# Patient Record
Sex: Male | Born: 1970 | Race: White | Hispanic: No | Marital: Single | Smoking: Never smoker
Health system: Southern US, Community
[De-identification: ages and names within clinical notes are randomized; demographics above are authoritative.]

## PROBLEM LIST (undated history)

## (undated) DIAGNOSIS — I82409 Acute embolism and thrombosis of unspecified deep veins of unspecified lower extremity: Secondary | ICD-10-CM

## (undated) DIAGNOSIS — R0600 Dyspnea, unspecified: Secondary | ICD-10-CM

## (undated) DIAGNOSIS — L03115 Cellulitis of right lower limb: Secondary | ICD-10-CM

## (undated) DIAGNOSIS — I739 Peripheral vascular disease, unspecified: Secondary | ICD-10-CM

## (undated) DIAGNOSIS — U071 COVID-19: Secondary | ICD-10-CM

## (undated) DIAGNOSIS — Z87442 Personal history of urinary calculi: Secondary | ICD-10-CM

## (undated) DIAGNOSIS — C801 Malignant (primary) neoplasm, unspecified: Secondary | ICD-10-CM

## (undated) DIAGNOSIS — E119 Type 2 diabetes mellitus without complications: Secondary | ICD-10-CM

## (undated) DIAGNOSIS — I10 Essential (primary) hypertension: Secondary | ICD-10-CM

## (undated) HISTORY — DX: Dyspnea, unspecified: R06.00

---

## 1998-10-09 ENCOUNTER — Encounter: Payer: Self-pay | Admitting: Emergency Medicine

## 1998-10-09 ENCOUNTER — Emergency Department (HOSPITAL_COMMUNITY): Admission: EM | Admit: 1998-10-09 | Discharge: 1998-10-09 | Payer: Self-pay | Admitting: Emergency Medicine

## 2004-06-14 ENCOUNTER — Emergency Department: Payer: Self-pay | Admitting: Emergency Medicine

## 2004-06-16 ENCOUNTER — Ambulatory Visit: Payer: Self-pay | Admitting: General Practice

## 2004-08-13 ENCOUNTER — Ambulatory Visit: Payer: Self-pay | Admitting: General Practice

## 2005-01-10 HISTORY — PX: CLAVICLE HARDWARE REMOVAL: SHX1351

## 2005-01-10 HISTORY — PX: FRACTURE SURGERY: SHX138

## 2013-06-18 ENCOUNTER — Encounter: Payer: Self-pay | Admitting: Critical Care Medicine

## 2013-06-19 ENCOUNTER — Encounter: Payer: Self-pay | Admitting: *Deleted

## 2013-06-19 ENCOUNTER — Ambulatory Visit (INDEPENDENT_AMBULATORY_CARE_PROVIDER_SITE_OTHER): Payer: BC Managed Care – PPO | Admitting: Critical Care Medicine

## 2013-06-19 ENCOUNTER — Encounter: Payer: Self-pay | Admitting: Critical Care Medicine

## 2013-06-19 VITALS — BP 118/80 | HR 73 | Temp 98.3°F | Ht 73.0 in | Wt 355.8 lb

## 2013-06-19 DIAGNOSIS — R0989 Other specified symptoms and signs involving the circulatory and respiratory systems: Secondary | ICD-10-CM

## 2013-06-19 DIAGNOSIS — R0600 Dyspnea, unspecified: Secondary | ICD-10-CM | POA: Insufficient documentation

## 2013-06-19 DIAGNOSIS — R0609 Other forms of dyspnea: Secondary | ICD-10-CM

## 2013-06-19 NOTE — Progress Notes (Signed)
Subjective:    Patient ID: Nicholas [name], male    DOB: 27-Jul-1970, 43 y.o.   MRN: [id number]  HPI Comments: Increased dyspnea with excess labor, farm work.  Noted about 1 month.  Heat is an issue.  Shortness of Breath This is a new problem. The current episode started more than 1 month ago. Episode frequency: exertional and only when very warm. The problem has been unchanged. Pertinent negatives include no chest pain, claudication, coryza, ear pain, fever, headaches, hemoptysis, leg pain, leg swelling, neck pain, orthopnea, PND, rash, rhinorrhea, sore throat, sputum production, swollen glands, syncope, vomiting or wheezing. The symptoms are aggravated by weather changes, odors, fumes and exercise. Associated symptoms comments: wieight up 40# in past year Snores, never studied for sleep apnea . He has tried nothing for the symptoms. There is no history of allergies, aspirin allergies, asthma, bronchiolitis, CAD, chronic lung disease, COPD, DVT, a heart failure, PE, pneumonia or a recent surgery.    Past Medical History  Diagnosis Date  . Dyspnea      Family History  Problem Relation Age of Onset  . Leukemia Mother   . Kidney disease Father   . Heart attack Maternal Grandfather   . Heart attack Paternal Grandfather      History   Social History  . Marital Status: Single    Spouse Name: N/A    Number of Children: N/A  . Years of Education: N/A   Occupational History  . [place] History Main Topics  . Smoking status: Never Smoker   . Smokeless tobacco: Current User    Types: Snuff  . Alcohol Use: No  . Drug Use: No  . Sexual Activity: Not on file   Other Topics Concern  . Not on file   Social History Narrative  . No narrative on file     Allergies  Allergen Reactions  . Penicillins      No outpatient prescriptions prior to visit.   No facility-administered medications prior to visit.      Review of Systems    Constitutional: Positive for fatigue and unexpected weight change. Negative for fever.  HENT: Negative for congestion, dental problem, ear pain, nosebleeds, postnasal drip, rhinorrhea, sinus pressure, sneezing, sore throat, trouble swallowing and voice change.   Eyes: Negative for redness and itching.  Respiratory: Positive for shortness of breath. Negative for apnea, cough, hemoptysis, sputum production, choking, chest tightness and wheezing.   Cardiovascular: Negative for chest pain, palpitations, orthopnea, claudication, leg swelling, syncope and PND.  Gastrointestinal: Positive for nausea. Negative for vomiting.       Some belching and heartburn  Genitourinary: Negative for dysuria.  Musculoskeletal: Negative for joint swelling and neck pain.  Skin: Negative for rash.  Neurological: Positive for light-headedness. Negative for headaches.  Hematological: Does not bruise/bleed easily.  Psychiatric/Behavioral: Negative for dysphoric mood. The patient is not nervous/anxious.        Objective:   Physical Exam  Filed Vitals:   06/19/13 0909  BP: 118/80  Pulse: 73  Temp: 98.3 F (36.8 C)  TempSrc: Oral  Height: 6\' 1"  (1.854 m)  Weight: 355 lb 12.8 oz (161.39 kg)  SpO2: 95%    Gen: Pleasant, obese, in no distress,  normal affect  ENT: No lesions,  mouth clear,  oropharynx clear, no postnasal drip  Neck: No JVD, no TMG, no carotid bruits  Lungs: No use of accessory muscles, no dullness to percussion, clear  without rales or rhonchi  Cardiovascular: RRR, heart sounds normal, no murmur or gallops, no peripheral edema  Abdomen: soft and NT, no HSM,  BS normal  Musculoskeletal: No deformities, no cyanosis or clubbing  Neuro: alert, non focal  Skin: Warm, no lesions or rashes  No results found.     Assessment & Plan:   Dyspnea Dyspnea with moderate obesity. Doubt primary lung disease Plan Nutrition referral for weight loss Pulmonary function study and 6 min walk and  ABG early morning slot at [facility] Review CXR    Updated Medication List No outpatient encounter prescriptions on file as of 06/19/2013.

## 2013-06-19 NOTE — Patient Instructions (Signed)
Nutrition referral for weight loss Pulmonary function study and 6 min walk and ABG early morning slot at [facility] We will get images from your chest xray to review Return after above completed

## 2013-06-20 NOTE — Assessment & Plan Note (Signed)
Dyspnea with moderate obesity. Doubt primary lung disease Plan Nutrition referral for weight loss Pulmonary function study and 6 min walk and ABG early morning slot at [facility] Review CXR

## 2013-06-25 ENCOUNTER — Telehealth: Payer: Self-pay | Admitting: *Deleted

## 2013-06-25 DIAGNOSIS — R0600 Dyspnea, unspecified: Secondary | ICD-10-CM

## 2013-06-25 NOTE — Telephone Encounter (Signed)
Message copied by Adalberto Ill on Tue Jun 25, 2013 11:01 AM ------      Message from: Nicholas [name]      Created: Mon Jun 24, 2013 11:08 AM       This pt needs a "sniff test" to be done at [facility] long radiology      I usually order this for fluoro but they may want to do it with ultrasound.             "evaluate diaphragm muscle weakness/paralysis, low lung volumes on imaging."            pw ------

## 2013-06-25 NOTE — Telephone Encounter (Signed)
Sniff Test scheduled for Thurs 06/27/13 at 11:30 at [facility]. Pt will already be over there that morning for PFT. Called and spoke with patient and he is aware that this test will be done after the 6 min walk, PFT and ABG. He has been advised to check in at Radiology Dept and no prep of this test. Pt is aware of appointment. Rhonda J Cobb

## 2013-06-25 NOTE — Telephone Encounter (Signed)
Pt returned call. Informed pt that PW wanted to schedule sniff test and someone will be contacting him to schedule it. Pt stated he will already be at [place] on 6/18 at 8am for other tests and has already taken a full day off work that day and wanted to see if the sniff test could be scheduled that day in the afternoon so he will not have to take another day off work. Advised pt that we will try and accommodate this. Pt verbalized understanding and denied any further questions or concerns at this time.   Will forward to [facility] and Crystal to make aware.

## 2013-06-25 NOTE — Telephone Encounter (Signed)
Sniff test order placed.  Per [facility], this will be done with fluoro. I ATC pt - went directly to VM.  [email or web address] to inform pt of below recs per PW and advised PCCs will be calling him to schedule this. Once I speak with pt, will need to send msg to [facility] to know to schedule as msg was created in [place] office.

## 2013-06-27 ENCOUNTER — Ambulatory Visit (HOSPITAL_COMMUNITY)
Admission: RE | Admit: 2013-06-27 | Discharge: 2013-06-27 | Disposition: A | Discharge status: Home / Self Care | Payer: BC Managed Care – PPO | Source: Ambulatory Visit | Attending: Critical Care Medicine | Admitting: Critical Care Medicine

## 2013-06-27 DIAGNOSIS — R0609 Other forms of dyspnea: Secondary | ICD-10-CM | POA: Insufficient documentation

## 2013-06-27 DIAGNOSIS — R0989 Other specified symptoms and signs involving the circulatory and respiratory systems: Principal | ICD-10-CM | POA: Insufficient documentation

## 2013-06-27 DIAGNOSIS — R0600 Dyspnea, unspecified: Secondary | ICD-10-CM

## 2013-06-27 LAB — PULMONARY FUNCTION TEST
DL/VA % pred: 116 %
DL/VA: 5.64 ml/min/mmHg/L
DLCO cor % pred: 106 %
DLCO cor: 38.62 ml/min/mmHg
DLCO unc % pred: 108 %
DLCO unc: 39.46 ml/min/mmHg
FEF 25-75 Post: 5.44 L/sec
FEF 25-75 Pre: 4.4 L/sec
FEF2575-%Change-Post: 23 %
FEF2575-%Pred-Post: 128 %
FEF2575-%Pred-Pre: 104 %
FEV1-%Change-Post: 5 %
FEV1-%Pred-Post: 90 %
FEV1-%Pred-Pre: 85 %
FEV1-Post: 4.13 L
FEV1-Pre: 3.9 L
FEV1FVC-%Change-Post: 1 %
FEV1FVC-%Pred-Pre: 104 %
FEV6-%Change-Post: 3 %
FEV6-%Pred-Post: 85 %
FEV6-%Pred-Pre: 82 %
FEV6-Post: 4.84 L
FEV6-Pre: 4.66 L
FEV6FVC-%Pred-Post: 103 %
FEV6FVC-%Pred-Pre: 103 %
FVC-%Change-Post: 3 %
FVC-%Pred-Post: 83 %
FVC-%Pred-Pre: 80 %
FVC-Post: 4.84 L
FVC-Pre: 4.66 L
Post FEV1/FVC ratio: 85 %
Post FEV6/FVC ratio: 100 %
Pre FEV1/FVC ratio: 84 %
Pre FEV6/FVC Ratio: 100 %
RV % pred: 74 %
RV: 1.49 L
TLC % pred: 85 %
TLC: 6.45 L

## 2013-06-27 LAB — BLOOD GAS, ARTERIAL
Acid-Base Excess: 1.8 mmol/L (ref 0.0–2.0)
Bicarbonate: 25.4 mEq/L — ABNORMAL HIGH (ref 20.0–24.0)
Drawn by: 244901
FIO2: 0.21 %
O2 Saturation: 95.4 %
Patient temperature: 98.6
TCO2: 21.8 mmol/L (ref 0–100)
pCO2 arterial: 38.5 mmHg (ref 35.0–45.0)
pH, Arterial: 7.435 (ref 7.350–7.450)
pO2, Arterial: 78.8 mmHg — ABNORMAL LOW (ref 80.0–100.0)

## 2013-06-27 MED ORDER — ALBUTEROL SULFATE (2.5 MG/3ML) 0.083% IN NEBU
2.5000 mg | INHALATION_SOLUTION | Freq: Once | RESPIRATORY_TRACT | Status: AC
  Administered 2013-06-27: 2.5 mg via RESPIRATORY_TRACT

## 2013-06-28 ENCOUNTER — Ambulatory Visit (HOSPITAL_COMMUNITY): Payer: BC Managed Care – PPO

## 2013-07-12 ENCOUNTER — Encounter: Payer: Self-pay | Admitting: Dietician

## 2013-07-12 ENCOUNTER — Encounter: Payer: BC Managed Care – PPO | Attending: Nurse Practitioner | Admitting: Dietician

## 2013-07-12 DIAGNOSIS — Z713 Dietary counseling and surveillance: Secondary | ICD-10-CM | POA: Insufficient documentation

## 2013-07-12 NOTE — Progress Notes (Signed)
  Medical Nutrition Therapy:  Appt start time: 1779 end time:  0840.   Assessment:  Primary concerns today: Rowdy is here today since he needs to lose weight and his doctor suggested that he talk to a dietitian. Has been having trouble breathing which the doctor thinks is being affected by his weight. 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 Lives with his young daughter most of the time and states that they most of their meals out since it is "cheaper than cooking". When he does cook it is on the grill. Skips 8-9 meals per week.   States he has "always been heavy" and it runs in his family. Has not tried to lose weight before and weight has been stable for about 3-4 years. States he is having a lot of stress, mostly due to dealing with custody issues with his daughter.   Does not have problems with cholesterol, blood sugar, and blood pressure.   Preferred Learning Style:   No preference indicated   Learning Readiness:   Ready  MEDICATIONS: none   DIETARY INTAKE:  Usual eating pattern includes 1-2 meals and 0 snacks per day.  Avoided foods include sweet drinks, Asian food   24-hr recall:  B ( AM): skips 5 x week, ham and cheese and omelet  Snk ( AM): none L ( PM): skips 3-4 x week, salad with iceberg lettuce, [place], ham and cheese or BBQ sandwich Snk ( PM): none D ( PM): steaks, vegetables, cheeseburgers, chicken  Snk ( PM): none Beverages: water, will have cranberry juice or milk every once in a while  Usual physical activity: not doing physical activity besides working  Estimated energy needs: 2200 calories 248 g carbohydrates 165 g protein 61 g fat  Progress Towards Goal(s):  In progress.   Nutritional Diagnosis:  [place]-3.3 Overweight/obesity As related to hx of meal skipping, large portions sizes, and frequent restaurant meals.  As evidenced by BMI of 46.6.    Intervention:  Nutrition counseling provided. Plan: Aim to walk  3-4 x week for 20-30 minutes. Eat 3 meals each day. For breakfast, try either a protein bar [facility]) or protein shake. Or have an Egg McMuffin with [employer]. Add fruit to meals and snacks. Aim to have half of your plate vegetables for lunch and dinner. (Think about packing a salad and bringing to work).  Half lean protein (not fried and remove skins and fat) the size of the palm of your hand. Try to use butter and bacon sparingly for flavor (try half the amount). Use small plates or get a to go box (if out) to help with portion sizes.  Teaching Method Utilized:  Visual Auditory Hands on  Handouts given during visit include:  MyPlate Handout   Barriers to learning/adherence to lifestyle change: stress, busy schedule  Demonstrated degree of understanding via:  Teach Back   Monitoring/Evaluation:  Dietary intake, exercise, and body weight in 2 month(s).'

## 2013-07-12 NOTE — Patient Instructions (Signed)
Aim to walk 3-4 x week for 20-30 minutes. Eat 3 meals each day. For breakfast, try either a protein bar [facility]) or protein shake. Or have an Egg McMuffin with [employer]. Add fruit to meals and snacks. Aim to have half of your plate vegetables for lunch and dinner. (Think about packing a salad and bringing to work).  Half lean protein (not fried and remove skins and fat) the size of the palm of your hand. Try to use butter and bacon sparingly for flavor (try half the amount). Use small plates or get a to go box (if out) to help with portion sizes.

## 2013-09-17 ENCOUNTER — Encounter (HOSPITAL_COMMUNITY): Payer: Self-pay | Admitting: Emergency Medicine

## 2013-09-17 ENCOUNTER — Inpatient Hospital Stay (HOSPITAL_COMMUNITY)
Admission: EM | Admit: 2013-09-17 | Discharge: 2013-09-21 | DRG: 300 | Disposition: A | Discharge status: Home / Self Care | Payer: BC Managed Care – PPO | Attending: Internal Medicine | Admitting: Internal Medicine

## 2013-09-17 ENCOUNTER — Emergency Department (HOSPITAL_COMMUNITY)
Admission: EM | Admit: 2013-09-17 | Discharge: 2013-09-17 | Disposition: A | Discharge status: Nursing Home (Includes ICF) | Payer: BC Managed Care – PPO | Source: Home / Self Care | Attending: Family Medicine | Admitting: Family Medicine

## 2013-09-17 DIAGNOSIS — I8000 Phlebitis and thrombophlebitis of superficial vessels of unspecified lower extremity: Principal | ICD-10-CM | POA: Diagnosis present

## 2013-09-17 DIAGNOSIS — L02419 Cutaneous abscess of limb, unspecified: Secondary | ICD-10-CM

## 2013-09-17 DIAGNOSIS — L0390 Cellulitis, unspecified: Secondary | ICD-10-CM | POA: Diagnosis present

## 2013-09-17 DIAGNOSIS — Z806 Family history of leukemia: Secondary | ICD-10-CM | POA: Diagnosis not present

## 2013-09-17 DIAGNOSIS — Z880 Allergy status to penicillin: Secondary | ICD-10-CM

## 2013-09-17 DIAGNOSIS — Z791 Long term (current) use of non-steroidal anti-inflammatories (NSAID): Secondary | ICD-10-CM | POA: Diagnosis not present

## 2013-09-17 DIAGNOSIS — Z841 Family history of disorders of kidney and ureter: Secondary | ICD-10-CM

## 2013-09-17 DIAGNOSIS — R0600 Dyspnea, unspecified: Secondary | ICD-10-CM | POA: Diagnosis present

## 2013-09-17 DIAGNOSIS — Z6841 Body Mass Index (BMI) 40.0 and over, adult: Secondary | ICD-10-CM | POA: Diagnosis not present

## 2013-09-17 DIAGNOSIS — Z823 Family history of stroke: Secondary | ICD-10-CM | POA: Diagnosis not present

## 2013-09-17 DIAGNOSIS — M7989 Other specified soft tissue disorders: Secondary | ICD-10-CM | POA: Diagnosis not present

## 2013-09-17 DIAGNOSIS — L03115 Cellulitis of right lower limb: Secondary | ICD-10-CM

## 2013-09-17 DIAGNOSIS — Z8249 Family history of ischemic heart disease and other diseases of the circulatory system: Secondary | ICD-10-CM | POA: Diagnosis not present

## 2013-09-17 DIAGNOSIS — M79609 Pain in unspecified limb: Secondary | ICD-10-CM

## 2013-09-17 DIAGNOSIS — L03119 Cellulitis of unspecified part of limb: Secondary | ICD-10-CM

## 2013-09-17 DIAGNOSIS — F17200 Nicotine dependence, unspecified, uncomplicated: Secondary | ICD-10-CM | POA: Diagnosis present

## 2013-09-17 DIAGNOSIS — I809 Phlebitis and thrombophlebitis of unspecified site: Secondary | ICD-10-CM

## 2013-09-17 HISTORY — DX: Cellulitis of right lower limb: L03.115

## 2013-09-17 LAB — CBC
HCT: 42.2 % (ref 39.0–52.0)
Hemoglobin: 14.4 g/dL (ref 13.0–17.0)
MCH: 29.6 pg (ref 26.0–34.0)
MCHC: 34.1 g/dL (ref 30.0–36.0)
MCV: 86.8 fL (ref 78.0–100.0)
Platelets: 260 10*3/uL (ref 150–400)
RBC: 4.86 MIL/uL (ref 4.22–5.81)
RDW: 13.5 % (ref 11.5–15.5)
WBC: 14.6 10*3/uL — ABNORMAL HIGH (ref 4.0–10.5)

## 2013-09-17 LAB — DIFFERENTIAL
Basophils Absolute: 0 10*3/uL (ref 0.0–0.1)
Basophils Relative: 0 % (ref 0–1)
Eosinophils Absolute: 0.2 10*3/uL (ref 0.0–0.7)
Eosinophils Relative: 1 % (ref 0–5)
Lymphocytes Relative: 20 % (ref 12–46)
Lymphs Abs: 2.9 10*3/uL (ref 0.7–4.0)
Monocytes Absolute: 1.2 10*3/uL — ABNORMAL HIGH (ref 0.1–1.0)
Monocytes Relative: 8 % (ref 3–12)
Neutro Abs: 10.3 10*3/uL — ABNORMAL HIGH (ref 1.7–7.7)
Neutrophils Relative %: 71 % (ref 43–77)

## 2013-09-17 LAB — BASIC METABOLIC PANEL
Anion gap: 14 (ref 5–15)
BUN: 12 mg/dL (ref 6–23)
CO2: 21 mEq/L (ref 19–32)
Calcium: 9 mg/dL (ref 8.4–10.5)
Chloride: 103 mEq/L (ref 96–112)
Creatinine, Ser: 0.88 mg/dL (ref 0.50–1.35)
GFR calc Af Amer: >90 mL/min (ref >90)
GFR calc non Af Amer: >90 mL/min (ref >90)
Glucose, Bld: 94 mg/dL (ref 70–99)
Potassium: 4.2 mEq/L (ref 3.7–5.3)
Sodium: 138 mEq/L (ref 137–147)

## 2013-09-17 MED ORDER — VANCOMYCIN HCL 10 G IV SOLR
2000.0000 mg | Freq: Once | INTRAVENOUS | Status: AC
  Administered 2013-09-17: 2000 mg via INTRAVENOUS
  Filled 2013-09-17: qty 2000

## 2013-09-17 MED ORDER — MORPHINE SULFATE 2 MG/ML IJ SOLN: 1.0000 mg | INTRAMUSCULAR | Status: DC | PRN

## 2013-09-17 MED ORDER — ACETAMINOPHEN 325 MG PO TABS: 650.0000 mg | ORAL_TABLET | Freq: Four times a day (QID) | ORAL | Status: DC | PRN

## 2013-09-17 MED ORDER — HEPARIN (PORCINE) IN NACL 100-0.45 UNIT/ML-% IJ SOLN
3000.0000 [IU]/h | INTRAMUSCULAR | Status: DC
  Administered 2013-09-17: 1500 [IU]/h via INTRAVENOUS
  Administered 2013-09-18: 2000 [IU]/h via INTRAVENOUS
  Administered 2013-09-19: 2400 [IU]/h via INTRAVENOUS
  Administered 2013-09-19: 2250 [IU]/h via INTRAVENOUS
  Administered 2013-09-20: 2800 [IU]/h via INTRAVENOUS
  Administered 2013-09-20 (×2): 3000 [IU]/h via INTRAVENOUS
  Administered 2013-09-20: 2800 [IU]/h via INTRAVENOUS
  Filled 2013-09-17 (×12): qty 250

## 2013-09-17 MED ORDER — HYDROMORPHONE HCL PF 1 MG/ML IJ SOLN: 1.0000 mg | INTRAMUSCULAR | Status: AC | PRN

## 2013-09-17 MED ORDER — ACETAMINOPHEN 650 MG RE SUPP: 650.0000 mg | Freq: Four times a day (QID) | RECTAL | Status: DC | PRN

## 2013-09-17 MED ORDER — ONDANSETRON HCL 4 MG PO TABS: 4.0000 mg | ORAL_TABLET | Freq: Four times a day (QID) | ORAL | Status: DC | PRN

## 2013-09-17 MED ORDER — VANCOMYCIN HCL 10 G IV SOLR
1500.0000 mg | Freq: Two times a day (BID) | INTRAVENOUS | Status: DC
  Administered 2013-09-18 – 2013-09-21 (×7): 1500 mg via INTRAVENOUS
  Filled 2013-09-17 (×8): qty 1500

## 2013-09-17 MED ORDER — PIPERACILLIN-TAZOBACTAM 3.375 G IVPB
3.3750 g | Freq: Three times a day (TID) | INTRAVENOUS | Status: DC
  Administered 2013-09-17 – 2013-09-21 (×11): 3.375 g via INTRAVENOUS
  Filled 2013-09-17 (×13): qty 50

## 2013-09-17 MED ORDER — HEPARIN BOLUS VIA INFUSION
4000.0000 [IU] | Freq: Once | INTRAVENOUS | Status: AC
  Administered 2013-09-17: 4000 [IU] via INTRAVENOUS
  Filled 2013-09-17: qty 4000

## 2013-09-17 MED ORDER — ONDANSETRON HCL 4 MG/2ML IJ SOLN: 4.0000 mg | Freq: Three times a day (TID) | INTRAMUSCULAR | Status: DC | PRN

## 2013-09-17 MED ORDER — CLINDAMYCIN PHOSPHATE 600 MG/50ML IV SOLN
600.0000 mg | Freq: Once | INTRAVENOUS | Status: AC
  Administered 2013-09-17: 600 mg via INTRAVENOUS
  Filled 2013-09-17: qty 50

## 2013-09-17 MED ORDER — SODIUM CHLORIDE 0.9 % IV SOLN
INTRAVENOUS | Status: DC
  Administered 2013-09-17: 23:00:00 via INTRAVENOUS

## 2013-09-17 MED ORDER — ONDANSETRON HCL 4 MG/2ML IJ SOLN: 4.0000 mg | Freq: Four times a day (QID) | INTRAMUSCULAR | Status: DC | PRN

## 2013-09-17 NOTE — ED Provider Notes (Addendum)
CSN: [id number]     Arrival date & time 09/17/13  1149 History   None    Chief Complaint  Patient presents with  . Leg Swelling   (Consider location/radiation/quality/duration/timing/severity/associated sxs/prior Treatment) Patient is a 43 y.o. male presenting with rash. The history is provided by the patient.  Rash Location:  Leg Leg rash location:  R upper leg Quality: painful and redness   Pain details:    Quality:  Throbbing   Severity:  Moderate   Progression:  Worsening Onset quality:  Sudden Duration:  4 days Progression:  Spreading Chronicity:  New Context comment:  Expanding since fri, assoc redness, pain and leg swelling to foot. Relieved by:  None tried Worsened by:  Nothing tried Ineffective treatments:  None tried Associated symptoms: induration and nausea   Associated symptoms: no fever and not vomiting     Past Medical History  Diagnosis Date  . Dyspnea   . Cellulitis of right leg 09/17/2013   Past Surgical History  Procedure Laterality Date  . Fracture surgery Left 2007    "shattered clavicle; broke it in 9 places"  . Clavicle hardware removal Left 2007   Family History  Problem Relation Age of Onset  . Leukemia Mother   . Kidney disease Father   . Heart attack Maternal Grandfather   . Heart attack Paternal Grandfather   . Stroke Other   . Heart disease Other   . Cancer Other    History  Substance Use Topics  . Smoking status: Never Smoker   . Smokeless tobacco: Current User    Types: Snuff  . Alcohol Use: No    Review of Systems  Constitutional: Negative.  Negative for fever.  Gastrointestinal: Positive for nausea. Negative for vomiting.  Musculoskeletal: Positive for gait problem. Negative for back pain.  Skin: Positive for rash.    Allergies  Penicillins  Home Medications   Prior to Admission medications   Not on File   BP 127/80  Pulse 87  Temp(Src) 99.2 F (37.3 C) (Oral)  Resp 16  SpO2 97% Physical Exam  Nursing note  and vitals reviewed. Constitutional: He is oriented to person, place, and time. He appears well-developed and well-nourished.  Abdominal: Soft. Bowel sounds are normal.  Musculoskeletal: He exhibits edema and tenderness.  Neurological: He is alert and oriented to person, place, and time.  Skin: There is erythema.  Well demarcated,nodular tender erythema to right medial thigh approx 10x 8 cm with assoc leg edema. Distal nvt intact.    ED Course  Procedures (including critical care time) Labs Review Labs Reviewed - No data to display  Imaging Review No results found.   MDM   1. Cellulitis of leg, right    Sent for further eval and care of right leg cellulitis and swelling, poss dvt.    Billy Fischer, MD 09/17/13 [place], MD 09/18/13 [phone]

## 2013-09-17 NOTE — Progress Notes (Signed)
*  PRELIMINARY RESULTS* Vascular Ultrasound Right lower extremity venous duplex has been completed.  Preliminary findings: No evidence of DVT. Multiple varicose veins noted in anterior thigh and some in the proximal thigh are thrombosed ( superficial thrombosis).  Landry Mellow, RDMS, RVT  09/17/2013, 3:36 PM

## 2013-09-17 NOTE — Progress Notes (Signed)
ANTIBIOTIC CONSULT NOTE - INITIAL  Pharmacy Consult for vancomycin and zosyn Indication: cellutlits  Allergies  Allergen Reactions  . Penicillins Other (See Comments)    Childhood reaction    Patient Measurements:   Adjusted Body Weight:   Vital Signs: Temp: 97.9 F (36.6 C) (09/08 1841) Temp src: Oral (09/08 1841) BP: 124/68 mmHg (09/08 1945) Pulse Rate: 82 (09/08 1945) Intake/Output from previous day:   Intake/Output from this shift:    Labs:  Recent Labs  09/17/13 1434  WBC 14.6*  HGB 14.4  PLT 260  CREATININE 0.88   The CrCl is unknown because both a height and weight (above a minimum accepted value) are required for this calculation. No results found for this basename: VANCOTROUGH, VANCOPEAK, VANCORANDOM, GENTTROUGH, GENTPEAK, GENTRANDOM, TOBRATROUGH, TOBRAPEAK, TOBRARND, AMIKACINPEAK, AMIKACINTROU, AMIKACIN,  in the last 72 hours   Microbiology: No results found for this or any previous visit (from the past 720 hour(s)).  Medical History: Past Medical History  Diagnosis Date  . Dyspnea     Medications:  Scheduled:   Infusions:  . sodium chloride     Assessment: 43 yo male with cellulitis will be started on vancomycin and zosyn.  SCr 0.88 (CrCl >100).  Patient weighs 160.3 kg.  Goal of Therapy:  Vancomycin trough level 10-15 mcg/ml  Plan:  - vancomycin 2g iv x1, then 1500 mg iv q12h - zosyn 3.375g iv q8h (4hr infusion) - monitor renal function and follow on antibiotic plan before checking vancomycin trough  Daylyn Azbill, Tsz-Yin 09/17/2013,8:44 PM

## 2013-09-17 NOTE — H&P (Signed)
[facility] History and Physical  Nicholas TEASDALE WUJ:[id number] DOB: [birth year] DOA: 09/17/2013  Referring physician: ER physician. PCP: Delia Chimes, NP  Specialists: Dr. Joya Gaskins. Pulmonologist.  Chief Complaint: Right lower extremity swelling and erythema.  HPI: Nicholas [name] is a 43 y.o. male with no significant past medical history started having right thigh swelling and erythema which progressed distally yesterday. Patient had gone to the urgent care and was referred to the ER. Patient had Dopplers done which only showed superficial thrombosis of the veins of the right thigh and no DVT. Patient on exam has swelling extending from the thigh with erythema to the distal part of the right leg. Patient has mild pain on moving and flexing the but has good sensation and pulses. Patient will be admitted for IV antibiotics for cellulitis. [place] but denies any trauma or insect bites. Patient has been evaluated by pulmonologist for dyspnea and was originally scheduled for followup with nutritionist tomorrow. Patient otherwise denies any chest pain or presently is not short of breath.  Review of Systems: As presented in the history of presenting illness, rest negative.  Past Medical History  Diagnosis Date  . Dyspnea    Past Surgical History  Procedure Laterality Date  . Shoulder surgery  2007   Social History:  reports that he has never smoked. His smokeless tobacco use includes Snuff. He reports that he does not drink alcohol or use illicit drugs. Where does patient live home. Can patient participate in ADLs? Yes.  Allergies  Allergen Reactions  . Penicillins Other (See Comments)    Childhood reaction    Family History:  Family History  Problem Relation Age of Onset  . Leukemia Mother   . Kidney disease Father   . Heart attack Maternal Grandfather   . Heart attack Paternal Grandfather   . Stroke Other   . Heart disease  Other   . Cancer Other       Prior to Admission medications   Not on File    Physical Exam: Filed Vitals:   09/17/13 1845 09/17/13 1900 09/17/13 1915 09/17/13 1945  BP: 110/69 126/72 130/70 124/68  Pulse: 70 76 77 82  Temp:      TempSrc:      Resp:      SpO2: 90% 97% 100% 97%     General:  Well-developed well-nourished.  Eyes: Anicteric no pallor.  ENT: No discharge from the ears eyes nose mouth.  Neck: No mass felt.  Cardiovascular: S1-S2 heard.  Respiratory: No rhonchi or crepitations.  Abdomen: Soft nontender bowel sounds present.  Skin: Erythema extending from the right thigh to the distal part of his right leg.  Musculoskeletal: Edema from thigh to the leg of right lower extremity.  Psychiatric: Appears normal.  Neurologic: Alert awake oriented to time place and person. Moves all extremities.  Labs on Admission:  Basic Metabolic Panel:  Recent Labs Lab 09/17/13 1434  NA 138  K 4.2  CL 103  CO2 21  GLUCOSE 94  BUN 12  CREATININE 0.88  CALCIUM 9.0   Liver Function Tests: No results found for this basename: AST, ALT, ALKPHOS, BILITOT, PROT, ALBUMIN,  in the last 168 hours No results found for this basename: LIPASE, AMYLASE,  in the last 168 hours No results found for this basename: AMMONIA,  in the last 168 hours CBC:  Recent Labs Lab 09/17/13 1434  WBC 14.6*  NEUTROABS 10.3*  HGB 14.4  HCT 42.2  MCV 86.8  PLT 260   Cardiac Enzymes: No results found for this basename: CKTOTAL, CKMB, CKMBINDEX, TROPONINI,  in the last 168 hours  BNP (last 3 results) No results found for this basename: PROBNP,  in the last 8760 hours CBG: No results found for this basename: GLUCAP,  in the last 168 hours  Radiological Exams on Admission: No results found.   Assessment/Plan Principal Problem:   Cellulitis of right leg Active Problems:   Thrombophlebitis   Cellulitis   1. Cellulitis of the right leg - patient has been placed on vancomycin  Zosyn. Closely observe for any worsening. Patient at this time does not have any symptoms of compartment syndrome but needs to be closely watched. 2. Right leg superficial thrombosis in the thigh area - have discussed with on-call vascular surgeon Dr. Trula Slade. Dr. Trula Slade advised patient to be placed on IV heparin for now and once patient's cellulitis improves to recheck Doppler of the right lower extremity to make sure there is no progression to DVT and if there is no progression then may discontinue anticoagulation. 3. History of dyspnea - being worked up by Dr. Joya Gaskins pulmonologist. Patient presently is not short of breath.    Code Status: Full code.  Family Communication: None.  Disposition Plan: Admit to inpatient.    Herberto Ledwell N. [facility] Pager [phone]  If 7PM-7AM, please contact night-coverage [email or web address] Password [email or web address] 09/17/2013, 8:40 PM

## 2013-09-17 NOTE — ED Notes (Signed)
PA at bedside.

## 2013-09-17 NOTE — ED Notes (Signed)
Pt  Has    Redness  Pain  /  Swelling  Of  r  Leg  X  5  Days         denys  Any  Injury       Redness  Spreading   Down the  Leg        -       Pt        Reports  Got  Nauseated          Earlier     Today

## 2013-09-17 NOTE — ED Provider Notes (Signed)
CSN: [id number]     Arrival date & time 09/17/13  1414 History   First MD Initiated Contact with Patient 09/17/13 1843     Chief Complaint  Patient presents with  . Leg Pain     (Consider location/radiation/quality/duration/timing/severity/associated sxs/prior Treatment) HPI Nicholas [name] is a 43 y.o. male who presents to ED with complaint of right leg pain and swelling. Pt reports symptoms began 4 days ago. States initially developed few red and swollen areas on the thigh, over the next 2 days it has spread down his right leg. States it is painful. Denies any injuries. No fever or chills. No hx of the same. Not taking any medications for this. Was seen at urgent care, sent here for further evaluation.      Past Medical History  Diagnosis Date  . Dyspnea    Past Surgical History  Procedure Laterality Date  . Shoulder surgery  2007   Family History  Problem Relation Age of Onset  . Leukemia Mother   . Kidney disease Father   . Heart attack Maternal Grandfather   . Heart attack Paternal Grandfather   . Stroke Other   . Heart disease Other   . Cancer Other    History  Substance Use Topics  . Smoking status: Never Smoker   . Smokeless tobacco: Current User    Types: Snuff  . Alcohol Use: No    Review of Systems  Constitutional: Negative for fever and chills.  Respiratory: Negative for cough, chest tightness and shortness of breath.   Cardiovascular: Negative for chest pain, palpitations and leg swelling.  Gastrointestinal: Negative for nausea, vomiting, abdominal pain, diarrhea and abdominal distention.  Genitourinary: Negative for dysuria, urgency, frequency and hematuria.  Musculoskeletal: Positive for myalgias. Negative for neck pain and neck stiffness.  Skin: Negative for rash.  Allergic/Immunologic: Negative for immunocompromised state.  Neurological: Negative for dizziness, weakness, light-headedness, numbness and headaches.      Allergies   Penicillins  Home Medications   Prior to Admission medications   Not on File   BP 110/69  Pulse 70  Temp(Src) 97.9 F (36.6 C) (Oral)  Resp 18  SpO2 90% Physical Exam  Nursing note and vitals reviewed. Constitutional: He appears well-developed and well-nourished. No distress.  HENT:  Head: Normocephalic and atraumatic.  Eyes: Conjunctivae are normal.  Neck: Neck supple.  Cardiovascular: Normal rate, regular rhythm and normal heart sounds.   Pulmonary/Chest: Effort normal. No respiratory distress. He has no wheezes. He has no rales.  Abdominal: Soft. Bowel sounds are normal. He exhibits no distension. There is no tenderness. There is no rebound.  Musculoskeletal:  Full rom of right ankle and knee  Neurological: He is alert.  Skin: Skin is warm and dry.  Extensive erythema, induration, warmth to palpation, tenderness over right anterior and medial thigh and right lateral lower leg. DP pulses intact.     ED Course  Procedures (including critical care time) Labs Review Labs Reviewed  CBC - Abnormal; Notable for the following:    WBC 14.6 (*)    All other components within normal limits  DIFFERENTIAL - Abnormal; Notable for the following:    Neutro Abs 10.3 (*)    Monocytes Absolute 1.2 (*)    All other components within normal limits  BASIC METABOLIC PANEL    Imaging Review No results found.   EKG Interpretation None      MDM   Final diagnoses:  Cellulitis of right leg  Thrombophlebitis  Patient with extensive cellulitis and apparently superficial thrombophlebitis in the right thigh, right lower leg. No DVTs noted. He is afebrile, nontoxic appearing. The white count is 14.6. Started on IV clindamycin. Due to the extent of cellulitis we'll admit for IV antibiotics. Spoke with Dr. Colvin Caroli who agreed.   Spoke with [facility], will admit.   Filed Vitals:   09/17/13 1841 09/17/13 1844 09/17/13 1845 09/17/13 1900  BP: 113/60  110/69 126/72  Pulse: 79  70 76   Temp: 97.9 F (36.6 C)     TempSrc: Oral     Resp: 18     SpO2: 98% 98% 90% 97%       Renold Genta, PA-C 09/17/13 1928

## 2013-09-17 NOTE — ED Notes (Signed)
Pt denies fevers/chills.

## 2013-09-17 NOTE — Progress Notes (Signed)
ANTICOAGULATION CONSULT NOTE - Initial Consult  Pharmacy Consult for heparin Indication: superficial thrombosis  Allergies  Allergen Reactions  . Penicillins Other (See Comments)    Childhood reaction    Patient Measurements:   Heparin Dosing Weight: 117.67 kg  Vital Signs: Temp: 97.9 F (36.6 C) (09/08 1841) Temp src: Oral (09/08 1841) BP: 124/68 mmHg (09/08 1945) Pulse Rate: 82 (09/08 1945)  Labs:  Recent Labs  09/17/13 1434  HGB 14.4  HCT Nicholas.2  PLT 260  CREATININE 0.88    The CrCl is unknown because both a height and weight (above a minimum accepted value) are required for this calculation.   Medical History: Past Medical History  Diagnosis Date  . Dyspnea     Medications:  Scheduled:  . piperacillin-tazobactam (ZOSYN)  IV  3.375 g Intravenous Q8H  . [START ON 09/18/2013] vancomycin  1,500 mg Intravenous Q12H  . vancomycin  2,000 mg Intravenous Once   Infusions:  . sodium chloride      Assessment: 43 yo [name] with superficial thrombosis will be started on heparin.  Baseline Hgb 14.4 and Plt 260 K.  DVT negative.  Goal of Therapy:  Heparin level 0.3-0.7 units/ml Monitor platelets by anticoagulation protocol: Yes   Plan:  - Heparin 4000 units iv bolus x1, then start heparin at 1500 units/hr - 6hr heparin level - daily heparin level and CBC  Chosen Geske, Tsz-Yin 09/17/2013,8:48 PM

## 2013-09-17 NOTE — ED Notes (Signed)
Pt sent from [facility] to be evaluated for redness and swelling to right upper leg x 4 days that is progressively worsening. Per note, possible cellulitis or DVT. Denies denies fever/chills. Pt denies pain when sitting. NAD. Ambulatory to triage.

## 2013-09-18 ENCOUNTER — Ambulatory Visit: Payer: BC Managed Care – PPO | Admitting: Dietician

## 2013-09-18 ENCOUNTER — Encounter (HOSPITAL_COMMUNITY): Payer: Self-pay | Admitting: General Practice

## 2013-09-18 DIAGNOSIS — R0609 Other forms of dyspnea: Secondary | ICD-10-CM

## 2013-09-18 DIAGNOSIS — R0989 Other specified symptoms and signs involving the circulatory and respiratory systems: Secondary | ICD-10-CM

## 2013-09-18 LAB — CBC
HCT: 38.9 % — ABNORMAL LOW (ref 39.0–52.0)
Hemoglobin: 13.2 g/dL (ref 13.0–17.0)
MCH: 29.5 pg (ref 26.0–34.0)
MCHC: 33.9 g/dL (ref 30.0–36.0)
MCV: 87 fL (ref 78.0–100.0)
Platelets: 229 10*3/uL (ref 150–400)
RBC: 4.47 MIL/uL (ref 4.22–5.81)
RDW: 13.7 % (ref 11.5–15.5)
WBC: 13.3 10*3/uL — ABNORMAL HIGH (ref 4.0–10.5)

## 2013-09-18 LAB — COMPREHENSIVE METABOLIC PANEL
ALT: 26 U/L (ref 0–53)
AST: 22 U/L (ref 0–37)
Albumin: 2.9 g/dL — ABNORMAL LOW (ref 3.5–5.2)
Alkaline Phosphatase: 56 U/L (ref 39–117)
Anion gap: 13 (ref 5–15)
BUN: 11 mg/dL (ref 6–23)
CO2: 21 mEq/L (ref 19–32)
Calcium: 8.4 mg/dL (ref 8.4–10.5)
Chloride: 106 mEq/L (ref 96–112)
Creatinine, Ser: 0.8 mg/dL (ref 0.50–1.35)
GFR calc Af Amer: >90 mL/min (ref >90)
GFR calc non Af Amer: >90 mL/min (ref >90)
Glucose, Bld: 102 mg/dL — ABNORMAL HIGH (ref 70–99)
Potassium: 4.1 mEq/L (ref 3.7–5.3)
Sodium: 140 mEq/L (ref 137–147)
Total Bilirubin: 0.6 mg/dL (ref 0.3–1.2)
Total Protein: 6.4 g/dL (ref 6.0–8.3)

## 2013-09-18 LAB — HEPARIN LEVEL (UNFRACTIONATED)
Heparin Unfractionated: <0.1 IU/mL — ABNORMAL LOW (ref 0.30–0.70)
Heparin Unfractionated: 0.21 IU/mL — ABNORMAL LOW (ref 0.30–0.70)
Heparin Unfractionated: <0.1 IU/mL — ABNORMAL LOW (ref 0.30–0.70)

## 2013-09-18 LAB — CK: Total CK: 142 U/L (ref 7–232)

## 2013-09-18 MED ORDER — HEPARIN BOLUS VIA INFUSION
3000.0000 [IU] | Freq: Once | INTRAVENOUS | Status: AC
  Administered 2013-09-18: 3000 [IU] via INTRAVENOUS
  Filled 2013-09-18: qty 3000

## 2013-09-18 NOTE — Progress Notes (Signed)
[place] for heparin Indication: superficial thrombosis  Allergies  Allergen Reactions  . Penicillins Other (See Comments)    Childhood reaction    Labs:  Recent Labs  09/17/13 1434 09/18/13 0410 09/18/13 1414  HGB 14.4 13.2  --   HCT 42.2 38.9*  --   PLT 260 229  --   HEPARINUNFRC  --  <0.10* 0.21*  CREATININE 0.88 0.80  --   CKTOTAL  --  142  --     Estimated Creatinine Clearance: 194.1 ml/min (by C-G formula based on Cr of 0.8).  Assessment: 43 yo male with RLE superficial thrombosis for heparin Heparin level = 0.21   Goal of Therapy:  Heparin level 0.3-0.7 units/ml Monitor platelets by anticoagulation protocol: Yes   Plan:  Increase heparin to 2250 units / hr Follow up 6 hour heparin level  Thank you. Anette Guarneri, PharmD [phone]  Tad Moore 09/18/2013,4:08 PM

## 2013-09-18 NOTE — Progress Notes (Signed)
ANTICOAGULATION CONSULT NOTE - Initial Consult  Pharmacy Consult for heparin Indication: superficial thrombosis  Allergies  Allergen Reactions  . Penicillins Other (See Comments)    Childhood reaction    Patient Measurements: Height: 6' 0.83" (185 cm) Weight: 353 lb 6.4 oz (160.3 kg) (07/2013) IBW/kg (Calculated) : 79.52 Heparin Dosing Weight: 117.67 kg  Vital Signs: Temp: 98.3 F (36.8 C) (09/08 2154) Temp src: Oral (09/08 1841) BP: 128/68 mmHg (09/08 2154) Pulse Rate: 82 (09/08 2154)  Labs:  Recent Labs  09/17/13 1434 09/18/13 0410  HGB 14.4 13.2  HCT 42.2 38.9*  PLT 260 229  HEPARINUNFRC  --  <0.10*  CREATININE 0.88 0.80  CKTOTAL  --  142    Estimated Creatinine Clearance: 194.1 ml/min (by C-G formula based on Cr of 0.8).  Assessment: 43 yo male with RLE superficial thrombosis for heparin   Goal of Therapy:  Heparin level 0.3-0.7 units/ml Monitor platelets by anticoagulation protocol: Yes   Plan:  Heparin 3000 units IV bolus, then increase heparin 2000 units/hr Check heparin level in 6 hours.     Aradhana Gin, Bronson Curb 09/18/2013,5:17 AM

## 2013-09-18 NOTE — Progress Notes (Signed)
Patient ID: Nicholas [name]  male  [id number]    DOB: 1970/04/04    DOA: 09/17/2013  PCP: Delia Chimes, NP  Assessment/Plan: Principal Problem:   Cellulitis of right leg and thigh  - Still has significant induration on the right thigh, patient states it is improving - Continue IV vancomycin and Zosyn - Will outline the cellulitic area for closer monitoring, currently no symptoms of compartment syndrome   Active Problems:  Right leg superficial Thrombophlebitis - Dr. Hal Hope discussed with on-call vascular surgeon, Dr. Trula Slade, recommended IV heparin for now and wants the patient's cellulitis has improved, recheck Doppler of the right lower extremity to make sure there is no progression of DVT. If there is no progression discontinue anticoagulation.  Dyspnea: Prior history, being worked up by Dr. Joya Gaskins his outpatient pulmonologist, currently has no complaints  DVT Prophylaxis: heparin drip  Code Status: full code  Family Communication:  Disposition:  Consultants:   Vascular surgery  Procedures:   None  Antibiotics:   IV vancomycin   Subjective:  Patient seen and examined, pain is somewhat controlled, cellulitis going on for ~1 week  Objective: Weight change:   Intake/Output Summary (Last 24 hours) at 09/18/13 0900 Last data filed at 09/18/13 0844  Gross per 24 hour  Intake    240 ml  Output      0 ml  Net    240 ml   Blood pressure 105/70, pulse 76, temperature 99 F (37.2 C), temperature source Oral, resp. rate 18, height 6' 0.84" (1.85 m), weight 160.3 kg (353 lb 6.4 oz), SpO2 97.00%.  Physical Exam: General: Alert and awake, oriented x3, not in any acute distress. CVS: S1-S2 clear, no murmur rubs or gallops Chest: clear to auscultation bilaterally, no wheezing, rales or rhonchi Abdomen: soft nontender, nondistended, normal bowel sounds  Extremities: no cyanosis, clubbing, +edema  right lower extremity, erythematous indurated area on the right  thigh about 4x5cm Neuro: Cranial nerves II-XII intact, no focal neurological deficits  Lab Results: Basic Metabolic Panel:  Recent Labs Lab 09/17/13 1434 09/18/13 0410  NA 138 140  K 4.2 4.1  CL 103 106  CO2 21 21  GLUCOSE 94 102*  BUN 12 11  CREATININE 0.88 0.80  CALCIUM 9.0 8.4   Liver Function Tests:  Recent Labs Lab 09/18/13 0410  AST 22  ALT 26  ALKPHOS 56  BILITOT 0.6  PROT 6.4  ALBUMIN 2.9*   No results found for this basename: LIPASE, AMYLASE,  in the last 168 hours No results found for this basename: AMMONIA,  in the last 168 hours CBC:  Recent Labs Lab 09/17/13 1434 09/18/13 0410  WBC 14.6* 13.3*  NEUTROABS 10.3*  --   HGB 14.4 13.2  HCT 42.2 38.9*  MCV 86.8 87.0  PLT 260 229   Cardiac Enzymes:  Recent Labs Lab 09/18/13 0410  CKTOTAL 142   BNP: No components found with this basename: POCBNP,  CBG: No results found for this basename: GLUCAP,  in the last 168 hours   Micro Results: No results found for this or any previous visit (from the past 240 hour(s)).  Studies/Results: No results found.  Medications: Scheduled Meds: . piperacillin-tazobactam (ZOSYN)  IV  3.375 g Intravenous Q8H  . vancomycin  1,500 mg Intravenous Q12H      LOS: 1 day   RAI,RIPUDEEP M.D. [facility] 09/18/2013, 9:00 AM Pager: [phone]  If 7PM-7AM, please contact night-coverage [email or web address] Password [email or web address]  **Disclaimer: This note was dictated with voice recognition  software. Similar sounding words can inadvertently be transcribed and this note may contain transcription errors which may not have been corrected upon publication of note.**

## 2013-09-19 ENCOUNTER — Inpatient Hospital Stay (HOSPITAL_COMMUNITY): Payer: BC Managed Care – PPO

## 2013-09-19 LAB — CBC
HCT: 39.7 % (ref 39.0–52.0)
Hemoglobin: 13.2 g/dL (ref 13.0–17.0)
MCH: 29.8 pg (ref 26.0–34.0)
MCHC: 33.2 g/dL (ref 30.0–36.0)
MCV: 89.6 fL (ref 78.0–100.0)
Platelets: 235 10*3/uL (ref 150–400)
RBC: 4.43 MIL/uL (ref 4.22–5.81)
RDW: 13.6 % (ref 11.5–15.5)
WBC: 12.9 10*3/uL — ABNORMAL HIGH (ref 4.0–10.5)

## 2013-09-19 LAB — HEPARIN LEVEL (UNFRACTIONATED)
Heparin Unfractionated: 0.15 IU/mL — ABNORMAL LOW (ref 0.30–0.70)
Heparin Unfractionated: <0.1 IU/mL — ABNORMAL LOW (ref 0.30–0.70)

## 2013-09-19 IMAGING — US US EXTREM LOW*R* LIMITED
1 series · 8 of 8 positions shown · non-contrast
Comparison: No comparison

CLINICAL DATA: 42-year-old male with a history of thigh pain,
question of abscess

EXAM:
ULTRASOUND right thigh LOWER EXTREMITY LIMITED
TECHNIQUE: Ultrasound examination of the lower extremity soft tissues was
performed in the area of clinical concern. Grayscale and color
images included of the superficial soft tissues overlying the
abnormality.

[Series 1: us extrem low*right* limited · 0.06mm/px · 8 of 8 slices shown]
[im 1/8]
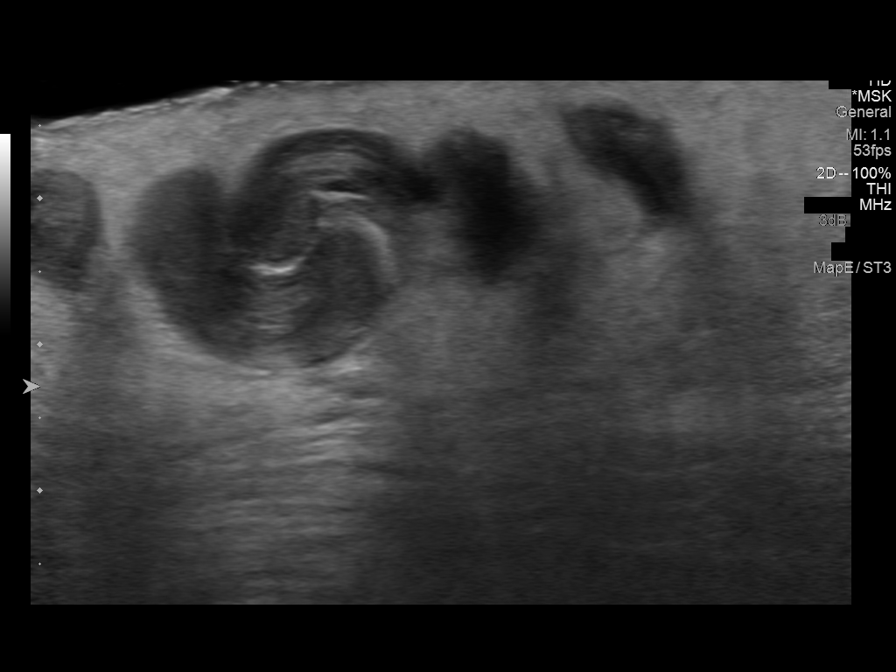
[im 2/8]
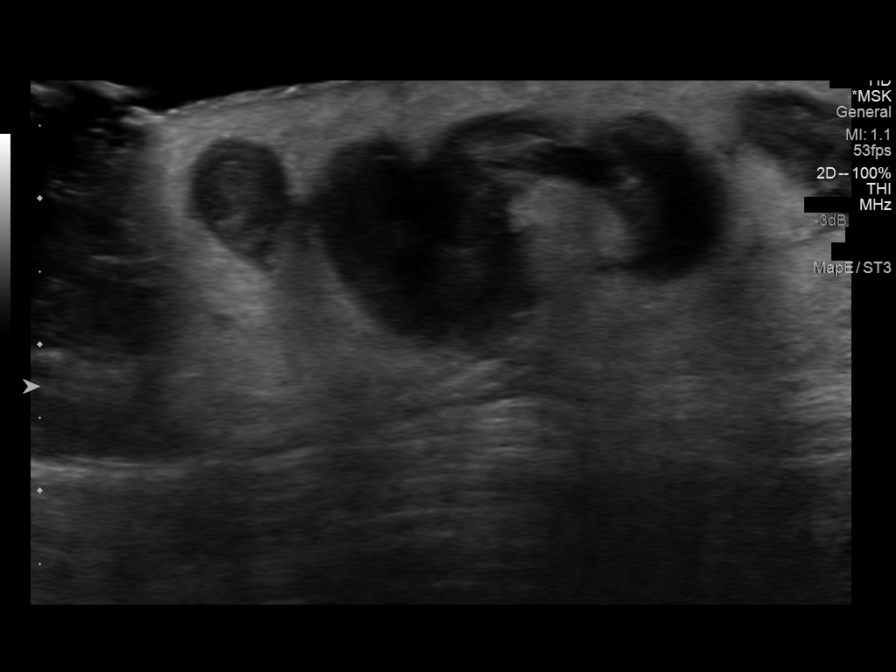
[im 3/8]
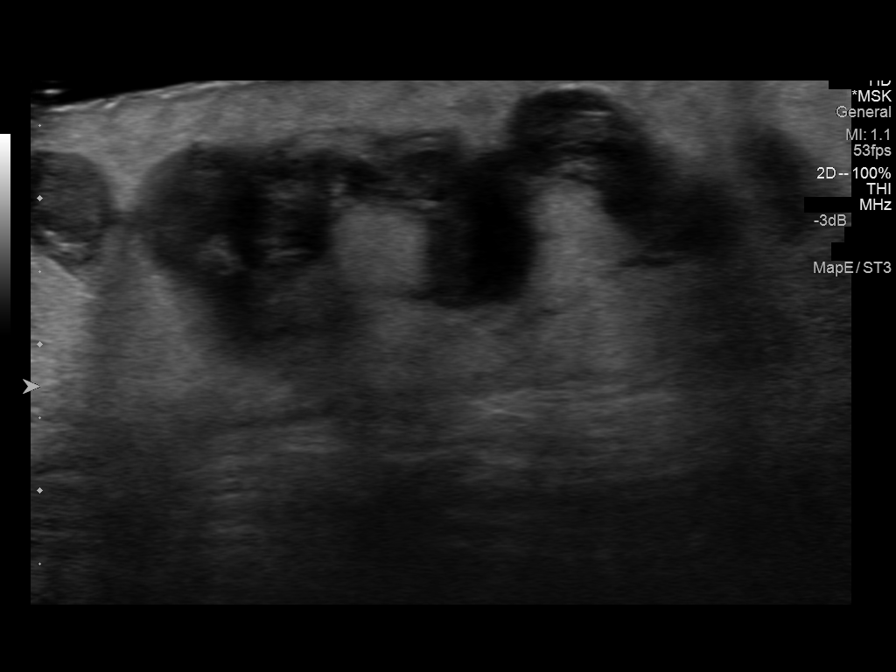
[im 4/8]
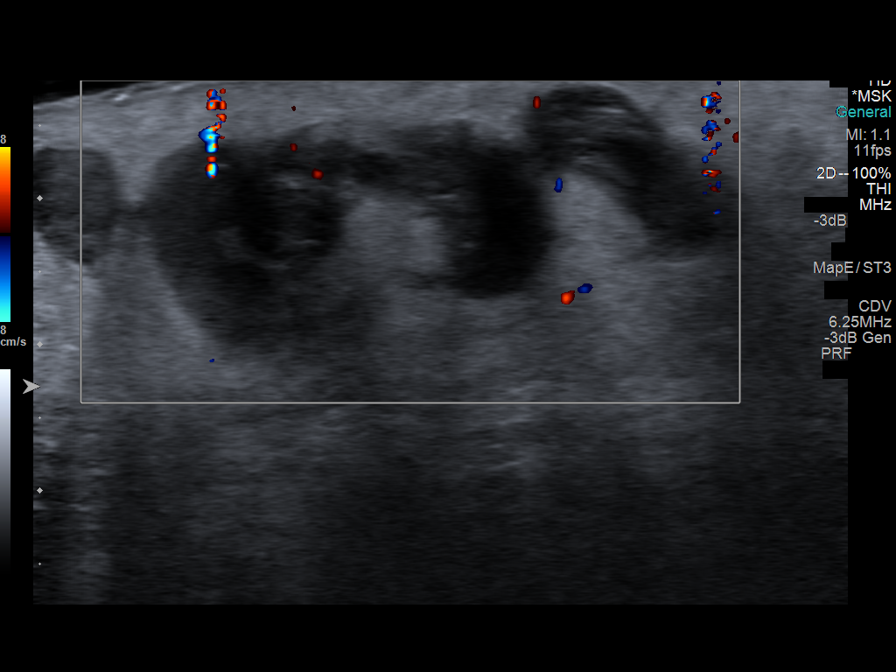
[im 5/8]
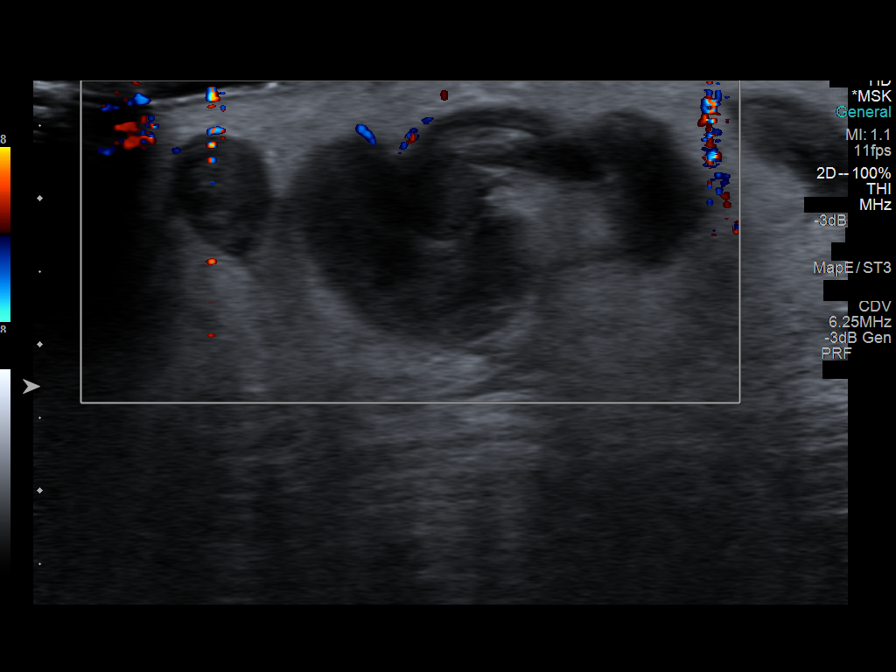
[im 6/8]
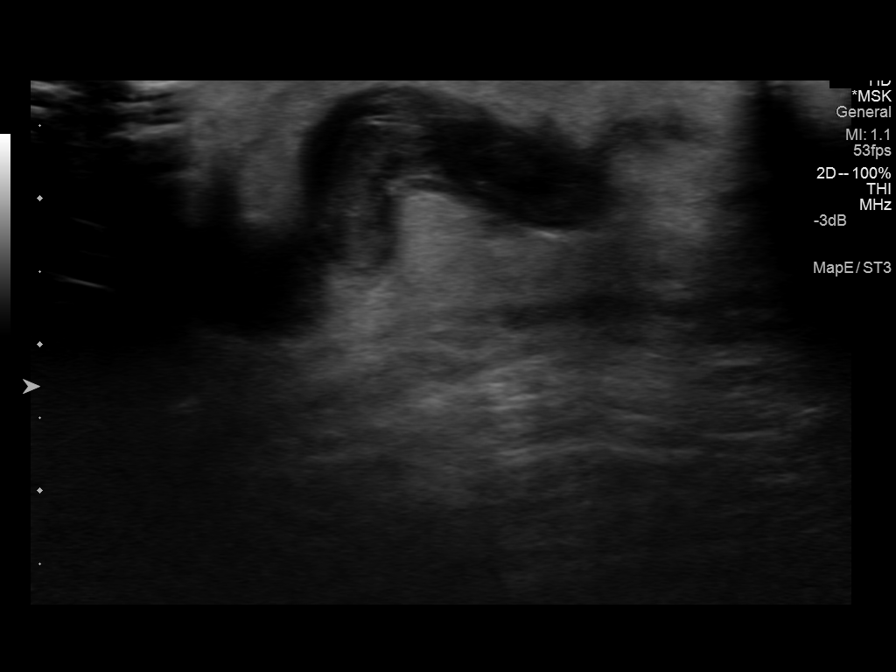
[im 7/8]
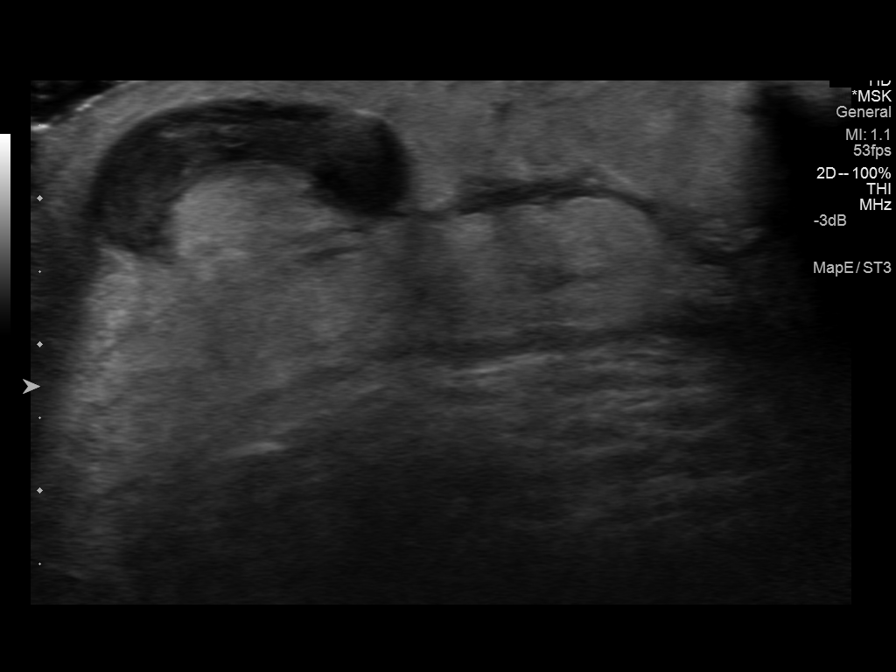
[im 8/8]
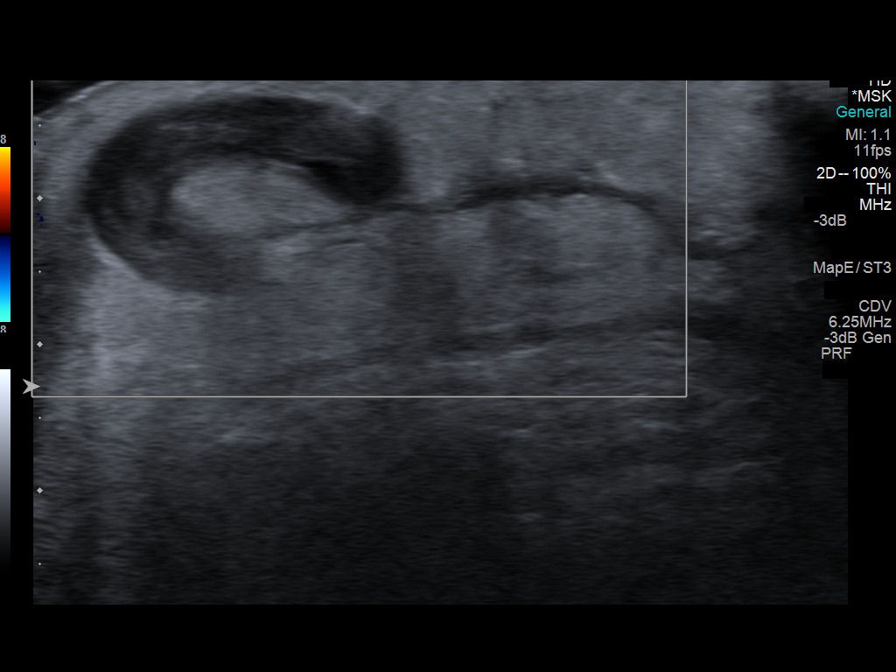

[8 of 8 positions shown; findings below may reference images not displayed]

FINDINGS: Multiple grayscale and color images of the right thigh were
performed overlying a region of erythema and pain.

Multiple dilated tortuous hypoechoic structures are evident on both
grayscale and color imaging. Color imaging demonstrates no internal
flow. All the structures are present within the superficial soft
tissues above the muscle plane. No compression views included. Mild
striations soft tissues compatible with edema.

No focal fluid collection.
IMPRESSION: Limited sonographic survey demonstrates tortuous structures in the
superficial soft tissues of the right thigh, favored to represent
venous varicosities, with features most compatible with
thrombophlebitis.

No focal fluid collection identified.

## 2013-09-19 MED ORDER — HEPARIN BOLUS VIA INFUSION
4000.0000 [IU] | Freq: Once | INTRAVENOUS | Status: AC
  Administered 2013-09-19: 4000 [IU] via INTRAVENOUS
  Filled 2013-09-19: qty 4000

## 2013-09-19 MED ORDER — HEPARIN BOLUS VIA INFUSION
2000.0000 [IU] | Freq: Once | INTRAVENOUS | Status: AC
  Administered 2013-09-19: 2000 [IU] via INTRAVENOUS
  Filled 2013-09-19: qty 2000

## 2013-09-19 NOTE — Progress Notes (Signed)
[place] for heparin Indication: superficial thrombosis  Allergies  Allergen Reactions  . Penicillins Other (See Comments)    Childhood reaction    Labs:  Recent Labs  09/17/13 1434 09/18/13 0410 09/18/13 1414 09/18/13 2234  HGB 14.4 13.2  --   --   HCT 42.2 38.9*  --   --   PLT 260 229  --   --   HEPARINUNFRC  --  <0.10* 0.21* <0.10*  CREATININE 0.88 0.80  --   --   CKTOTAL  --  142  --   --     Estimated Creatinine Clearance: 194.1 ml/min (by C-G formula based on Cr of 0.8).  Assessment: 43 yo male with RLE superficial thrombosis for heparin Heparin level = now undectectable despite rate increase. Heparin admixed bag hanging.    Goal of Therapy:  Heparin level 0.3-0.7 units/ml Monitor platelets by anticoagulation protocol: Yes   Plan:  Change heparin bag to commercially prepared product bolus 4000 unitsx1 then continue at 2250/hr. And recheck in 6 hours.  Curlene Dolphin 09/19/2013,12:00 AM

## 2013-09-19 NOTE — Progress Notes (Signed)
[place] for heparin Indication: superficial thrombosis  Allergies  Allergen Reactions  . Penicillins Other (See Comments)    Childhood reaction    Labs:  Recent Labs  09/17/13 1434  09/18/13 0410 09/18/13 1414 09/18/13 2234 09/19/13 0635  HGB 14.4  --  13.2  --   --  13.2  HCT 42.2  --  38.9*  --   --  39.7  PLT 260  --  229  --   --  235  HEPARINUNFRC  --   < > <0.10* 0.21* <0.10* 0.15*  CREATININE 0.88  --  0.80  --   --   --   CKTOTAL  --   --  142  --   --   --   < > = values in this interval not displayed.  Estimated Creatinine Clearance: 194.1 ml/min (by C-G formula based on Cr of 0.8).  Assessment: 43 yo male with RLE superficial thrombosis for heparin Heparin level = 0.15   Goal of Therapy:  Heparin level 0.3-0.7 units/ml Monitor platelets by anticoagulation protocol: Yes   Plan:  Heparin 2000 units iv bolus x 1 Increase heparin to 2400 units / hr Follow up 6 hour heparin level  Thank you. Anette Guarneri, PharmD [phone]  Tad Moore 09/19/2013,11:17 AM

## 2013-09-19 NOTE — Consult Note (Signed)
Reason for Consult:RLE cellulitis Referring Physician: Jamill Wetmore is an 43 y.o. male.  HPI: Nicholas [name] developed a sore place about the size of a quarter on his right inner thigh last Friday. Since then it steadily spread. He was seen an urgent care and referred to the ED. He was placed on heparin and IV abx.  Past Medical History  Diagnosis Date  . Dyspnea   . Cellulitis of right leg 09/17/2013    Past Surgical History  Procedure Laterality Date  . Fracture surgery Left 2007    "shattered clavicle; broke it in 9 places"  . Clavicle hardware removal Left 2007    Family History  Problem Relation Age of Onset  . Leukemia Mother   . Kidney disease Father   . Heart attack Maternal Grandfather   . Heart attack Paternal Grandfather   . Stroke Other   . Heart disease Other   . Cancer Other     Social History:  reports that he has never smoked. His smokeless tobacco use includes Snuff. He reports that he does not drink alcohol or use illicit drugs.  Allergies:  Allergies  Allergen Reactions  . Penicillins Other (See Comments)    Childhood reaction    Medications: I have reviewed the patient's current medications.  Results for orders placed during the hospital encounter of 09/17/13 (from the past 48 hour(s))  CBC     Status: Abnormal   Collection Time    09/17/13  2:34 PM      Result Value Ref Range   WBC 14.6 (*) 4.0 - 10.5 K/uL   RBC 4.86  4.22 - 5.81 MIL/uL   Hemoglobin 14.4  13.0 - 17.0 g/dL   HCT 42.2  39.0 - 52.0 %   MCV 86.8  78.0 - 100.0 fL   MCH 29.6  26.0 - 34.0 pg   MCHC 34.1  30.0 - 36.0 g/dL   RDW 13.5  11.5 - 15.5 %   Platelets 260  150 - 400 K/uL  BASIC METABOLIC PANEL     Status: None   Collection Time    09/17/13  2:34 PM      Result Value Ref Range   Sodium 138  137 - 147 mEq/L   Potassium 4.2  3.7 - 5.3 mEq/L   Chloride 103  96 - 112 mEq/L   CO2 21  19 - 32 mEq/L   Glucose, Bld 94  70 - 99 mg/dL   BUN 12  6 - 23 mg/dL   Creatinine, Ser  0.88  0.50 - 1.35 mg/dL   Calcium 9.0  8.4 - 10.5 mg/dL   GFR calc non Af Amer >90  >90 mL/min   GFR calc Af Amer >90  >90 mL/min   Comment: (NOTE)     The eGFR has been calculated using the CKD EPI equation.     This calculation has not been validated in all clinical situations.     eGFR's persistently <90 mL/min signify possible Chronic Kidney     Disease.   Anion gap 14  5 - 15  DIFFERENTIAL     Status: Abnormal   Collection Time    09/17/13  2:34 PM      Result Value Ref Range   Neutrophils Relative % 71  43 - 77 %   Neutro Abs 10.3 (*) 1.7 - 7.7 K/uL   Lymphocytes Relative 20  12 - 46 %   Lymphs Abs 2.9  0.7 - 4.0 K/uL  Monocytes Relative 8  3 - 12 %   Monocytes Absolute 1.2 (*) 0.1 - 1.0 K/uL   Eosinophils Relative 1  0 - 5 %   Eosinophils Absolute 0.2  0.0 - 0.7 K/uL   Basophils Relative 0  0 - 1 %   Basophils Absolute 0.0  0.0 - 0.1 K/uL  CK     Status: None   Collection Time    09/18/13  4:10 AM      Result Value Ref Range   Total CK 142  7 - 232 U/L  COMPREHENSIVE METABOLIC PANEL     Status: Abnormal   Collection Time    09/18/13  4:10 AM      Result Value Ref Range   Sodium 140  137 - 147 mEq/L   Potassium 4.1  3.7 - 5.3 mEq/L   Chloride 106  96 - 112 mEq/L   CO2 21  19 - 32 mEq/L   Glucose, Bld 102 (*) 70 - 99 mg/dL   BUN 11  6 - 23 mg/dL   Creatinine, Ser 0.80  0.50 - 1.35 mg/dL   Calcium 8.4  8.4 - 10.5 mg/dL   Total Protein 6.4  6.0 - 8.3 g/dL   Albumin 2.9 (*) 3.5 - 5.2 g/dL   AST 22  0 - 37 U/L   ALT 26  0 - 53 U/L   Alkaline Phosphatase 56  39 - 117 U/L   Total Bilirubin 0.6  0.3 - 1.2 mg/dL   GFR calc non Af Amer >90  >90 mL/min   GFR calc Af Amer >90  >90 mL/min   Comment: (NOTE)     The eGFR has been calculated using the CKD EPI equation.     This calculation has not been validated in all clinical situations.     eGFR's persistently <90 mL/min signify possible Chronic Kidney     Disease.   Anion gap 13  5 - 15  HEPARIN LEVEL  (UNFRACTIONATED)     Status: Abnormal   Collection Time    09/18/13  4:10 AM      Result Value Ref Range   Heparin Unfractionated <0.10 (*) 0.30 - 0.70 IU/mL   Comment:            IF HEPARIN RESULTS ARE BELOW     EXPECTED VALUES, AND PATIENT     DOSAGE HAS BEEN CONFIRMED,     SUGGEST FOLLOW UP TESTING     OF ANTITHROMBIN III LEVELS.     REPEATED TO VERIFY  CBC     Status: Abnormal   Collection Time    09/18/13  4:10 AM      Result Value Ref Range   WBC 13.3 (*) 4.0 - 10.5 K/uL   RBC 4.47  4.22 - 5.81 MIL/uL   Hemoglobin 13.2  13.0 - 17.0 g/dL   HCT 38.9 (*) 39.0 - 52.0 %   MCV 87.0  78.0 - 100.0 fL   MCH 29.5  26.0 - 34.0 pg   MCHC 33.9  30.0 - 36.0 g/dL   RDW 13.7  11.5 - 15.5 %   Platelets 229  150 - 400 K/uL  HEPARIN LEVEL (UNFRACTIONATED)     Status: Abnormal   Collection Time    09/18/13  2:14 PM      Result Value Ref Range   Heparin Unfractionated 0.21 (*) 0.30 - 0.70 IU/mL   Comment:            IF HEPARIN  RESULTS ARE BELOW     EXPECTED VALUES, AND PATIENT     DOSAGE HAS BEEN CONFIRMED,     SUGGEST FOLLOW UP TESTING     OF ANTITHROMBIN III LEVELS.  HEPARIN LEVEL (UNFRACTIONATED)     Status: Abnormal   Collection Time    09/18/13 10:34 PM      Result Value Ref Range   Heparin Unfractionated <0.10 (*) 0.30 - 0.70 IU/mL   Comment:            IF HEPARIN RESULTS ARE BELOW     EXPECTED VALUES, AND PATIENT     DOSAGE HAS BEEN CONFIRMED,     SUGGEST FOLLOW UP TESTING     OF ANTITHROMBIN III LEVELS.  HEPARIN LEVEL (UNFRACTIONATED)     Status: Abnormal   Collection Time    09/19/13  6:35 AM      Result Value Ref Range   Heparin Unfractionated 0.15 (*) 0.30 - 0.70 IU/mL   Comment:            IF HEPARIN RESULTS ARE BELOW     EXPECTED VALUES, AND PATIENT     DOSAGE HAS BEEN CONFIRMED,     SUGGEST FOLLOW UP TESTING     OF ANTITHROMBIN III LEVELS.  CBC     Status: Abnormal   Collection Time    09/19/13  6:35 AM      Result Value Ref Range   WBC 12.9 (*) 4.0 - 10.5  K/uL   RBC 4.43  4.22 - 5.81 MIL/uL   Hemoglobin 13.2  13.0 - 17.0 g/dL   HCT 39.7  39.0 - 52.0 %   MCV 89.6  78.0 - 100.0 fL   MCH 29.8  26.0 - 34.0 pg   MCHC 33.2  30.0 - 36.0 g/dL   RDW 13.6  11.5 - 15.5 %   Platelets 235  150 - 400 K/uL    Review of Systems  Cardiovascular: Positive for leg swelling.  Musculoskeletal: Positive for myalgias (RLE).  All other systems reviewed and are negative.  Blood pressure 123/65, pulse 80, temperature 97.9 F (36.6 C), temperature source Oral, resp. rate 18, height 6' 0.84" (1.85 m), weight 353 lb 6.4 oz (160.3 kg), SpO2 98.00%. Physical Exam  Musculoskeletal:  RLE: Inner thigh erythematous, indurated, TTP. Small focus of swelling distally could represent fluctuence. Erythema shows recession in this area from marking done yesterday. Shin and lateral calf TTP, mild erythema on lateral calf.     Assessment/Plan: RLE cellulitis/possible abscess -- I think there may be a small area of fluctuence distally on the thigh but given his anticoagulation, recession of erythema, improvement in his leukocytosis, and subjective sense of improvement it would be reasonable to wait another day to see if this area declares itself. I understand they may do an [place] which may help direct therapy if it shows a large abscess. We will follow along with you.   Thank you for this consult.    Lisette Abu, PA-C Pager: [phone] 09/19/2013, 10:17 AM

## 2013-09-19 NOTE — Clinical Documentation Improvement (Signed)
09/17/13 H&P.Marland KitchenMarland KitchenPatient has been evaluated by pulmonologist for dyspnea and was originally scheduled for followup with nutritionist tomorrow."... 09/18/13 Prog note..." weight 160.3 kg (353 lb 6.4 oz),  09/18/13 @ 0500 Doc Flow Sheet: "Ht> 6' 0.83"; WT 353 lb 6.4 oz; Cal BMI 46.9" For accurate Dx specificity & severity can noted clinical findings be further linked to possible, suspected or likely cond being eval'd, mon'd &/ or tx'd. Thank you  Possible Clinical conditions Morbid Obesity W/ BMI Underweight w/BMI Other condition Cannot clinically determine  Risk Factors: Sign & Symptoms: See above note Diagnostics: See above note Treatment: See above note  Thank You, Ermelinda Das, RN, BSN, CCDS Certified Clinical Documentation Specialist Pager: [phone] [place]: Health Information Management

## 2013-09-19 NOTE — Consult Note (Signed)
I have seen and examined the patient and agree with the assessment and plans.  The ultrasound does not show an underlying fluid collection, but his exam is very suspicious. If it is not markedly improved in the next 24 hours, will plan an I and D in the [place]. Ninfa Linden  MD, FACS

## 2013-09-19 NOTE — Progress Notes (Signed)
PHARMACY NOTE  Pharmacy Consult :  43 y.o. male is currently on Heparin bridging for atrial fibrillation .   Heparin Dosing Wt :  118 kg  Hematology :  Recent Labs  09/17/13 1434 09/18/13 0410 09/18/13 1414 09/18/13 2234 09/19/13 0635 09/19/13 1800  HGB 14.4 13.2  --   --  13.2  --   HCT 42.2 38.9*  --   --  39.7  --   PLT 260 229  --   --  235  --   HEPARINUNFRC  --  <0.10* 0.21* <0.10* 0.15* <0.10*  CREATININE 0.88 0.80  --   --   --   --     Current Medication[s] Include: Infusion[s]: Infusions:  . heparin 2,400 Units/hr (09/19/13 1242)   Assessment :  Follow up Heparin level after bolus and rate increase to 2400 units/hr reported as < 0.1 unit/ml.  No interruptions or infusion problems noted by RN report.  Heparin remains subtherapeutic.  No evidence of bleeding complications observed.  Goal :  Heparin level 0.3-0.7 units/ml.  Plan : 1. Heparin bolus 4000 units IV now, then  increase Heparin infusion to 2800 units/hr.  The next Heparin Level will be with the AM labs.    2. Daily Heparin level, CBC while on Heparin.  Monitor for bleeding complications. Follow Platelet counts.  Florabel Faulks, Craig Guess,  Pharm.D  09/19/2013  8:31 PM

## 2013-09-19 NOTE — Progress Notes (Signed)
Patient ID: Nicholas [name]  male  VEL:[id number]    DOB: 12-01-1970    DOA: 09/17/2013  PCP: Delia Chimes, NP  Assessment/Plan: Principal Problem:   Cellulitis of right leg and thigh  - Still has significant induration on the right thigh, had outlined it yesterday, ordered localize right thigh ultrasound to rule out any underlying abscess. Requested surgery consult if I&D needed - Continue IV vancomycin and Zosyn - currently no symptoms of compartment syndrome  Active Problems:  Right leg superficial Thrombophlebitis - Dr. Hal Hope discussed with on-call vascular surgeon, Dr. Trula Slade, recommended IV heparin for now and once patient's cellulitis has improved, recheck Doppler of the right lower extremity to make sure there is no progression of DVT. If there is no progression discontinue anticoagulation.  Dyspnea: Prior history, being worked up by Dr. Joya Gaskins his outpatient pulmonologist, currently has no complaints  Morbid obesity: Counseled on diet and weight control   DVT Prophylaxis: heparin drip  Code Status: full code  Family Communication:  Disposition:  Consultants:   Vascular surgery  Surgery  Procedures:   None  Antibiotics:   IV vancomycin   Zosyn  Subjective:  Patient seen and examined, still has significant induration, erythema, tenderness on the right thigh, cellulitis on the right lower leg is improving  Objective: Weight change:   Intake/Output Summary (Last 24 hours) at 09/19/13 1004 Last data filed at 09/19/13 0812  Gross per 24 hour  Intake    820 ml  Output    500 ml  Net    320 ml   Blood pressure 123/65, pulse 80, temperature 97.9 F (36.6 C), temperature source Oral, resp. rate 18, height 6' 0.84" (1.85 m), weight 160.3 kg (353 lb 6.4 oz), SpO2 98.00%.  Physical Exam: General: Alert and awake, oriented x3, NAD CVS: S1-S2 clear, no murmur rubs or gallops Chest: clear to auscultation bilaterally Abdomen: soft nontender, nondistended,  normal bowel sounds  Extremities: no cyanosis, clubbing, RLE: erythematous indurated area on the right thigh about 4x5cm, cellulitic area on right lower leg improving   Lab Results: Basic Metabolic Panel:  Recent Labs Lab 09/17/13 1434 09/18/13 0410  NA 138 140  K 4.2 4.1  CL 103 106  CO2 21 21  GLUCOSE 94 102*  BUN 12 11  CREATININE 0.88 0.80  CALCIUM 9.0 8.4   Liver Function Tests:  Recent Labs Lab 09/18/13 0410  AST 22  ALT 26  ALKPHOS 56  BILITOT 0.6  PROT 6.4  ALBUMIN 2.9*   No results found for this basename: LIPASE, AMYLASE,  in the last 168 hours No results found for this basename: AMMONIA,  in the last 168 hours CBC:  Recent Labs Lab 09/17/13 1434 09/18/13 0410 09/19/13 0635  WBC 14.6* 13.3* 12.9*  NEUTROABS 10.3*  --   --   HGB 14.4 13.2 13.2  HCT 42.2 38.9* 39.7  MCV 86.8 87.0 89.6  PLT 260 229 235   Cardiac Enzymes:  Recent Labs Lab 09/18/13 0410  CKTOTAL 142   BNP: No components found with this basename: POCBNP,  CBG: No results found for this basename: GLUCAP,  in the last 168 hours   Micro Results: No results found for this or any previous visit (from the past 240 hour(s)).  Studies/Results: No results found.  Medications: Scheduled Meds: . piperacillin-tazobactam (ZOSYN)  IV  3.375 g Intravenous Q8H  . vancomycin  1,500 mg Intravenous Q12H      LOS: 2 days   Mercy Leppla M.D. [facility] 09/19/2013,  10:04 AM Pager: [phone]  If 7PM-7AM, please contact night-coverage [email or web address] Password [email or web address]  **Disclaimer: This note was dictated with voice recognition software. Similar sounding words can inadvertently be transcribed and this note may contain transcription errors which may not have been corrected upon publication of note.**

## 2013-09-20 LAB — CBC
HCT: 39.1 % (ref 39.0–52.0)
Hemoglobin: 13.3 g/dL (ref 13.0–17.0)
MCH: 29.6 pg (ref 26.0–34.0)
MCHC: 34 g/dL (ref 30.0–36.0)
MCV: 87.1 fL (ref 78.0–100.0)
Platelets: 290 10*3/uL (ref 150–400)
RBC: 4.49 MIL/uL (ref 4.22–5.81)
RDW: 13.6 % (ref 11.5–15.5)
WBC: 12.9 10*3/uL — ABNORMAL HIGH (ref 4.0–10.5)

## 2013-09-20 LAB — HEPARIN LEVEL (UNFRACTIONATED)
Heparin Unfractionated: 0.3 IU/mL (ref 0.30–0.70)
Heparin Unfractionated: 0.2 IU/mL — ABNORMAL LOW (ref 0.30–0.70)

## 2013-09-20 MED ORDER — IBUPROFEN 400 MG PO TABS
400.0000 mg | ORAL_TABLET | Freq: Four times a day (QID) | ORAL | Status: DC | PRN
  Filled 2013-09-20: qty 1

## 2013-09-20 MED ORDER — FUROSEMIDE 10 MG/ML IJ SOLN
20.0000 mg | Freq: Once | INTRAMUSCULAR | Status: AC
  Administered 2013-09-20: 20 mg via INTRAVENOUS
  Filled 2013-09-20: qty 2

## 2013-09-20 NOTE — Progress Notes (Signed)
Subjective: He reports less right thigh pain  Objective: Vital signs in last 24 hours: Temp:  [97.8 F (36.6 C)-98.3 F (36.8 C)] 98.3 F (36.8 C) (09/11 0548) Pulse Rate:  [81-93] 81 (09/11 0548) Resp:  [16-18] 16 (09/11 0548) BP: (115-142)/(65-76) 115/65 mmHg (09/11 0548) SpO2:  [96 %-98 %] 96 % (09/11 0548) Last BM Date: 09/16/13  Intake/Output from previous day: 09/10 0701 - 09/11 0700 In: 240 [P.O.:240] Out: 800 [Urine:800] Intake/Output this shift: Total I/O In: 240 [P.O.:240] Out: -   Erythema remains on the right thigh, but the areas of concern are softer and much less tender  Lab Results:   Recent Labs  09/19/13 0635 09/20/13 0600  WBC 12.9* 12.9*  HGB 13.2 13.3  HCT 39.7 39.1  PLT 235 290   BMET  Recent Labs  09/17/13 1434 09/18/13 0410  NA 138 140  K 4.2 4.1  CL 103 106  CO2 21 21  GLUCOSE 94 102*  BUN 12 11  CREATININE 0.88 0.80  CALCIUM 9.0 8.4   PT/INR No results found for this basename: LABPROT, INR,  in the last 72 hours ABG No results found for this basename: PHART, PCO2, PO2, HCO3,  in the last 72 hours  Studies/Results: [place] Extrem Low Right Ltd  09/19/2013   CLINICAL DATA:  43 year old male with a history of thigh pain, question of abscess  EXAM: ULTRASOUND right thigh LOWER EXTREMITY LIMITED  TECHNIQUE: Ultrasound examination of the lower extremity soft tissues was performed in the area of clinical concern. Grayscale and color images included of the superficial soft tissues overlying the abnormality.  COMPARISON:  No comparison  FINDINGS: Multiple grayscale and color images of the right thigh were performed overlying a region of erythema and pain.  Multiple dilated tortuous hypoechoic structures are evident on both grayscale and color imaging. Color imaging demonstrates no internal flow. All the structures are present within the superficial soft tissues above the muscle plane. No compression views included. Mild striations soft tissues  compatible with edema.  No focal fluid collection.  IMPRESSION: Limited sonographic survey demonstrates tortuous structures in the superficial soft tissues of the right thigh, favored to represent venous varicosities, with features most compatible with thrombophlebitis.  No focal fluid collection identified.  Signed,  Dulcy Fanny. Earleen Newport, DO  [facility]  [facility]   Electronically Signed   By: Corrie Mckusick O.D.   On: 09/19/2013 12:34    Anti-infectives: Anti-infectives   Start     Dose/Rate Route Frequency Ordered Stop   09/18/13 1000  vancomycin (VANCOCIN) 1,500 mg in sodium chloride 0.9 % 500 mL IVPB     1,500 mg 250 mL/hr over 120 Minutes Intravenous Every 12 hours 09/17/13 2048     09/17/13 2300  piperacillin-tazobactam (ZOSYN) IVPB 3.375 g     3.375 g 12.5 mL/hr over 240 Minutes Intravenous Every 8 hours 09/17/13 2048     09/17/13 2200  vancomycin (VANCOCIN) 2,000 mg in sodium chloride 0.9 % 500 mL IVPB     2,000 mg 250 mL/hr over 120 Minutes Intravenous  Once 09/17/13 2048 09/18/13 0125   09/17/13 1900  clindamycin (CLEOCIN) IVPB 600 mg     600 mg 100 mL/hr over 30 Minutes Intravenous  Once 09/17/13 1855 09/17/13 2007      Assessment/Plan:  Right thigh cellulitis with superficial thrombophlebitis  I don't think there is underlying abscess.  I think we are feeling clot in superficial veins which I think are thrombosed veins.  I have no plans currently for surgery.  These should improve which warm compresses and ibuprofen.  LOS: 3 days    Gordon Carlson A 09/20/2013

## 2013-09-20 NOTE — Progress Notes (Signed)
ANTICOAGULATION  / Antibiotic CONSULT NOTE   Pharmacy Consult for heparin / Vancomycin and Zosyn Indication: superficial thrombosis  Allergies  Allergen Reactions  . Penicillins Other (See Comments)    Childhood reaction    Labs:  Recent Labs  09/17/13 1434 09/18/13 0410  09/19/13 0635 09/19/13 1800 09/20/13 0600 09/20/13 1140  HGB 14.4 13.2  --  13.2  --  13.3  --   HCT 42.2 38.9*  --  39.7  --  39.1  --   PLT 260 229  --  235  --  290  --   HEPARINUNFRC  --  <0.10*  < > 0.15* <0.10* 0.30 0.20*  CREATININE 0.88 0.80  --   --   --   --   --   CKTOTAL  --  142  --   --   --   --   --   < > = values in this interval not displayed.  Estimated Creatinine Clearance: 194.1 ml/min (by C-G formula based on Cr of 0.8).  Assessment: 43 yo male with RLE superficial thrombosis for heparin Heparin level = 0.2  - repeat doppler planned for today to determine need for long-term anti-coag  Day 4 of IV antibiotics with plan to transition to po 9/12   Goal of Therapy:  Heparin level 0.3-0.7 units/ml Monitor platelets by anticoagulation protocol: Yes Appropriate antibiotic dosing   Plan:  Heparin to 3000 units / hr Continue Zosyn 4 hr infusion Continue Vancomycin 1500 mg iv Q 12 hours Continue to follow  Thank you. Anette Guarneri, PharmD [phone]  Tad Moore

## 2013-09-20 NOTE — Progress Notes (Signed)
Patient ID: Nicholas [name]  male  MOL:[id number]    DOB: 01/20/1970    DOA: 09/17/2013  PCP: Delia Chimes, NP  Assessment/Plan: Principal Problem:   Cellulitis of right leg and thigh  - Still has significant induration on the right thigh, erythema has significantly improved, limited ultrasound showed venous varicosities most compatible with thrombophlebitis no focal fluid collection.  - Surgery was consulted, do not feel that patient needs I&D, likely underlying thrombosed veins, should improve with warm compresses and ibuprofen - Continue IV vancomycin and Zosyn for today, transitioned to oral antibiotics tomorrow - currently no symptoms of compartment syndrome, still significant edema, will try Lasix 20 mg x1  Active Problems:  Right leg superficial Thrombophlebitis - Dr. Hal Hope discussed with on-call vascular surgeon, Dr. Trula Slade, recommended IV heparin for now and once patient's cellulitis has improved, recheck Doppler of the right lower extremity to make sure there is no progression of DVT. If there is no progression discontinue anticoagulation. - Recheck Doppler of the right lower extremity, patient may need to be on anticoagulation and repeat the duplex in 4-6 weeks outpatient  Dyspnea: Prior history, being worked up by Dr. Joya Gaskins his outpatient pulmonologist, currently has no complaints  Morbid obesity: Counseled on diet and weight control   DVT Prophylaxis: heparin drip  Code Status: full code  Family Communication:  Disposition: 24-48 hours  Consultants:   Vascular surgery  Surgery  Procedures:   None  Antibiotics:   IV vancomycin   Zosyn  Subjective:  Patient seen and examined, still has significant induration, in the right eye but erythema and tenderness has significant improved, cellulitis of the right lower leg is improving, still tight  Objective: Weight change:   Intake/Output Summary (Last 24 hours) at 09/20/13 1040 Last data filed at  09/20/13 0845  Gross per 24 hour  Intake    240 ml  Output    800 ml  Net   -560 ml   Blood pressure 115/65, pulse 81, temperature 98.3 F (36.8 C), temperature source Oral, resp. rate 16, height 6' 0.84" (1.85 m), weight 160.3 kg (353 lb 6.4 oz), SpO2 96.00%.  Physical Exam: General: Alert and awake, oriented x3, NAD CVS: S1-S2 clear, no murmur rubs or gallops Chest: clear to auscultation bilaterally Abdomen: soft nontender, nondistended, normal bowel sounds  Extremities: no cyanosis, clubbing, RLE: indurated area on the right thigh, cellulitic area on right lower leg improving   Lab Results: Basic Metabolic Panel:  Recent Labs Lab 09/17/13 1434 09/18/13 0410  NA 138 140  K 4.2 4.1  CL 103 106  CO2 21 21  GLUCOSE 94 102*  BUN 12 11  CREATININE 0.88 0.80  CALCIUM 9.0 8.4   Liver Function Tests:  Recent Labs Lab 09/18/13 0410  AST 22  ALT 26  ALKPHOS 56  BILITOT 0.6  PROT 6.4  ALBUMIN 2.9*   No results found for this basename: LIPASE, AMYLASE,  in the last 168 hours No results found for this basename: AMMONIA,  in the last 168 hours CBC:  Recent Labs Lab 09/17/13 1434  09/19/13 0635 09/20/13 0600  WBC 14.6*  < > 12.9* 12.9*  NEUTROABS 10.3*  --   --   --   HGB 14.4  < > 13.2 13.3  HCT 42.2  < > 39.7 39.1  MCV 86.8  < > 89.6 87.1  PLT 260  < > 235 290  < > = values in this interval not displayed. Cardiac Enzymes:  Recent Labs Lab  09/18/13 0410  CKTOTAL 142   BNP: No components found with this basename: POCBNP,  CBG: No results found for this basename: GLUCAP,  in the last 168 hours   Micro Results: No results found for this or any previous visit (from the past 240 hour(s)).  Studies/Results: No results found.  Medications: Scheduled Meds: . piperacillin-tazobactam (ZOSYN)  IV  3.375 g Intravenous Q8H  . vancomycin  1,500 mg Intravenous Q12H      LOS: 3 days   Nicholas [name] M.D. [facility] 09/20/2013, 10:40 AM Pager:  [phone]  If 7PM-7AM, please contact night-coverage [email or web address] Password [email or web address]  **Disclaimer: This note was dictated with voice recognition software. Similar sounding words can inadvertently be transcribed and this note may contain transcription errors which may not have been corrected upon publication of note.**

## 2013-09-20 NOTE — Progress Notes (Signed)
ANTICOAGULATION CONSULT NOTE - Follow Up Consult  Pharmacy Consult for heparin Indication: atrial fibrillation  Labs:  Recent Labs  09/17/13 1434 09/18/13 0410  09/19/13 0635 09/19/13 1800 09/20/13 0600  HGB 14.4 13.2  --  13.2  --  13.3  HCT 42.2 38.9*  --  39.7  --  39.1  PLT 260 229  --  235  --  290  HEPARINUNFRC  --  <0.10*  < > 0.15* <0.10* 0.30  CREATININE 0.88 0.80  --   --   --   --   CKTOTAL  --  142  --   --   --   --   < > = values in this interval not displayed.   Assessment/Plan:  43yo male therapeutic on heparin after rate adjustments. Will continue gtt at current rate and confirm stable with additional level.   Wynona Neat, PharmD, BCPS  09/20/2013,8:01 AM

## 2013-09-21 LAB — BASIC METABOLIC PANEL
Anion gap: 15 (ref 5–15)
BUN: 11 mg/dL (ref 6–23)
CO2: 20 mEq/L (ref 19–32)
Calcium: 8.9 mg/dL (ref 8.4–10.5)
Chloride: 101 mEq/L (ref 96–112)
Creatinine, Ser: 0.86 mg/dL (ref 0.50–1.35)
GFR calc Af Amer: >90 mL/min (ref >90)
GFR calc non Af Amer: >90 mL/min (ref >90)
Glucose, Bld: 81 mg/dL (ref 70–99)
Potassium: 5.1 mEq/L (ref 3.7–5.3)
Sodium: 136 mEq/L — ABNORMAL LOW (ref 137–147)

## 2013-09-21 LAB — CBC
HCT: 39.5 % (ref 39.0–52.0)
Hemoglobin: 13.5 g/dL (ref 13.0–17.0)
MCH: 29.7 pg (ref 26.0–34.0)
MCHC: 34.2 g/dL (ref 30.0–36.0)
MCV: 86.8 fL (ref 78.0–100.0)
Platelets: 275 10*3/uL (ref 150–400)
RBC: 4.55 MIL/uL (ref 4.22–5.81)
RDW: 13.6 % (ref 11.5–15.5)
WBC: 12.5 10*3/uL — ABNORMAL HIGH (ref 4.0–10.5)

## 2013-09-21 MED ORDER — DOXYCYCLINE HYCLATE 100 MG PO CAPS: 100.0000 mg | ORAL_CAPSULE | Freq: Two times a day (BID) | ORAL | Status: DC

## 2013-09-21 MED ORDER — OMEPRAZOLE 20 MG PO CPDR: 20.0000 mg | DELAYED_RELEASE_CAPSULE | Freq: Every day | ORAL | Status: DC

## 2013-09-21 MED ORDER — IBUPROFEN 400 MG PO TABS: 400.0000 mg | ORAL_TABLET | Freq: Four times a day (QID) | ORAL | Status: DC | PRN

## 2013-09-21 NOTE — Discharge Summary (Signed)
Physician Discharge Summary  Patient ID: Nicholas [name] MRN: [id number] DOB/AGE: [birth year] 43 y.o.  Admit date: 09/17/2013 Discharge date: 09/21/2013  Primary Care Physician:  Delia Chimes, NP  Discharge Diagnoses:    . Cellulitis of right leg . superficial Thrombophlebitis . Cellulitis . Morbid obesity . Dyspnea  Consults:  General surgery, Dr. Ninfa Linden                    Vascular surgery   Recommendations for Outpatient Follow-up:  Patient was recommended ibuprofen, warm compresses on the right thigh with superficial thrombophlebitis.  Allergies:   Allergies  Allergen Reactions  . Penicillins Other (See Comments)    Childhood reaction     Discharge Medications:   Medication List         doxycycline 100 MG capsule  Commonly known as:  VIBRAMYCIN  Take 1 capsule (100 mg total) by mouth 2 (two) times daily. X 2WEEKS     ibuprofen 400 MG tablet  Commonly known as:  ADVIL,MOTRIN  Take 1 tablet (400 mg total) by mouth every 6 (six) hours as needed for moderate pain or cramping.     omeprazole 20 MG capsule  Commonly known as:  PRILOSEC  Take 1 capsule (20 mg total) by mouth daily. While you are taking ibuprofen         Brief H and P: For complete details please refer to admission H and P, but in [place] is a 43 y.o. male with no significant past medical history started having right thigh swelling and erythema which progressed distally yesterday. Patient had gone to the urgent care and was referred to the ER. Patient had Dopplers done which only showed superficial thrombosis of the veins of the right thigh and no DVT. Patient on exam had swelling extending from the thigh with erythema to the distal part of the right leg. Patient has mild pain on moving and flexing the but has good sensation and pulses. Patient was admitted for IV antibiotics for cellulitis. [place] but denied any trauma or insect bites.  Patient had  been evaluated by pulmonologist for dyspnea and was originally scheduled for followup with nutritionist tomorrow.  Hospital Course:  Cellulitis of right leg and thigh  Patient was admitted for IV antibiotics. Limited ultrasound showed venous varicosities most compatible with thrombophlebitis no focal fluid collection. Surgery was consulted, do not feel that patient needs I&D, likely underlying thrombosed veins, should improve with warm compresses and ibuprofen. Patient was transitioned to oral doxycycline for 2 weeks. He will followup with Dr. Ninfa Linden for wound check in one to 2 weeks.  Right leg superficial Thrombophlebitis  Patient was placed on IV heparin on admission. Doppler ultrasound of the lower extremity showed superficial thrombophlebitis in right greater saphenous vein and varicosities in the proximal thigh, patent right saphenofemoral junction. I discussed in detail with Dr. Bridgett Larsson with vascular surgery who did not recommend oral anticoagulation for superficial thrombophlebitis. Dr. Vallarie Mare recommended warm compresses and ibuprofen. Patient will followup with Dr. Bridgett Larsson in office in 3-4 weeks for followup.  Dyspnea: Prior history, being worked up by Dr. Joya Gaskins his outpatient pulmonologist, currently has no complaints   Morbid obesity: Counseled on diet and weight control     Day of Discharge BP 107/67  Pulse 80  Temp(Src) 98 F (36.7 C) (Oral)  Resp 16  Ht 6' 0.83" (1.85 m)  Wt 160.3 kg (353 lb 6.4 oz)  BMI 46.84 kg/m2  SpO2 98%  Physical Exam: General: Alert and awake oriented x3 not in any acute distress. CVS: S1-S2 clear no murmur rubs or gallops Chest: clear to auscultation bilaterally, no wheezing rales or rhonchi Abdomen: soft nontender, nondistended, normal bowel sounds Extremities: no cyanosis, clubbing or edema noted bilaterally, right thigh and leg cellulitis has significantly improved Neuro: Cranial nerves II-XII intact, no focal neurological  deficits   The results of significant diagnostics from this hospitalization (including imaging, microbiology, ancillary and laboratory) are listed below for reference.    LAB RESULTS: Basic Metabolic Panel:  Recent Labs Lab 09/18/13 0410 09/21/13 0725  NA 140 136*  K 4.1 5.1  CL 106 101  CO2 21 20  GLUCOSE 102* 81  BUN 11 11  CREATININE 0.80 0.86  CALCIUM 8.4 8.9   Liver Function Tests:  Recent Labs Lab 09/18/13 0410  AST 22  ALT 26  ALKPHOS 56  BILITOT 0.6  PROT 6.4  ALBUMIN 2.9*   No results found for this basename: LIPASE, AMYLASE,  in the last 168 hours No results found for this basename: AMMONIA,  in the last 168 hours CBC:  Recent Labs Lab 09/17/13 1434  09/20/13 0600 09/21/13 0725  WBC 14.6*  < > 12.9* 12.5*  NEUTROABS 10.3*  --   --   --   HGB 14.4  < > 13.3 13.5  HCT 42.2  < > 39.1 39.5  MCV 86.8  < > 87.1 86.8  PLT 260  < > 290 275  < > = values in this interval not displayed. Cardiac Enzymes:  Recent Labs Lab 09/18/13 0410  CKTOTAL 142   BNP: No components found with this basename: POCBNP,  CBG: No results found for this basename: GLUCAP,  in the last 168 hours  Significant Diagnostic Studies:  No results found.    Disposition and Follow-up:     Discharge Instructions   Diet - low sodium heart healthy    Complete by:  As directed      Discharge instructions    Complete by:  As directed   Please use heating pad for the right thigh every 2 hour or so for the superficial varicose veins clot. Take ibuprofen for a week every 6-8 hours, also take omeprazole while taking ibuprofen to prevent heart burn or acid reflux.     Increase activity slowly    Complete by:  As directed             DISPOSITION: Home  DIET: Heart healthy    DISCHARGE FOLLOW-UP Follow-up Information   Follow up with Hinda Lenis, MD. Schedule an appointment as soon as possible for a visit in 3 weeks. (for hospital follow-up. Please make  appointment in 3-4 weeks for your varicose veins and superficial clot.)    Specialty:  Vascular Surgery   Contact information:   [place] [address] [phone]       Follow up with Delia Chimes, NP. Schedule an appointment as soon as possible for a visit in 2 weeks. (for hospital follow-up)    Specialty:  Nurse Practitioner   Contact information:   [place] [address] [phone]       Follow up with [facility] A, MD. Schedule an appointment as soon as possible for a visit in 1 week. (For wound re-check)    Specialty:  General Surgery   Contact information:   [address] [place] [address] [phone]  Time spent on Discharge: 40 mins  Signed:   Tommaso Cavitt M.D. [facility] 09/21/2013, 12:22 PM Pager: [phone]   **Disclaimer: This note was dictated with voice recognition software. Similar sounding words can inadvertently be transcribed and this note may contain transcription errors which may not have been corrected upon publication of note.**

## 2013-09-21 NOTE — Progress Notes (Signed)
Subjective: He reports pain is much less  Objective: Vital signs in last 24 hours: Temp:  [97.5 F (36.4 C)-98.1 F (36.7 C)] 98 F (36.7 C) (09/12 0600) Pulse Rate:  [80-90] 80 (09/12 0600) Resp:  [16] 16 (09/12 0600) BP: (107-117)/(65-68) 107/67 mmHg (09/12 0600) SpO2:  [95 %-98 %] 98 % (09/12 0600) Last BM Date: 09/20/13  Intake/Output from previous day: 09/11 0701 - 09/12 0700 In: 720 [P.O.:720] Out: 400 [Urine:400] Intake/Output this shift:    Right thigh erythema slowly improving Much, much less tenderness and softer at areas of thrombosed veins  Lab Results:   Recent Labs  09/20/13 0600 09/21/13 0725  WBC 12.9* 12.5*  HGB 13.3 13.5  HCT 39.1 39.5  PLT 290 275   BMET  Recent Labs  09/21/13 0725  NA 136*  K 5.1  CL 101  CO2 20  GLUCOSE 81  BUN 11  CREATININE 0.86  CALCIUM 8.9   PT/INR No results found for this basename: LABPROT, INR,  in the last 72 hours ABG No results found for this basename: PHART, PCO2, PO2, HCO3,  in the last 72 hours  Studies/Results: [place] Extrem Low Right Ltd  09/19/2013   CLINICAL DATA:  43 year old male with a history of thigh pain, question of abscess  EXAM: ULTRASOUND right thigh LOWER EXTREMITY LIMITED  TECHNIQUE: Ultrasound examination of the lower extremity soft tissues was performed in the area of clinical concern. Grayscale and color images included of the superficial soft tissues overlying the abnormality.  COMPARISON:  No comparison  FINDINGS: Multiple grayscale and color images of the right thigh were performed overlying a region of erythema and pain.  Multiple dilated tortuous hypoechoic structures are evident on both grayscale and color imaging. Color imaging demonstrates no internal flow. All the structures are present within the superficial soft tissues above the muscle plane. No compression views included. Mild striations soft tissues compatible with edema.  No focal fluid collection.  IMPRESSION: Limited  sonographic survey demonstrates tortuous structures in the superficial soft tissues of the right thigh, favored to represent venous varicosities, with features most compatible with thrombophlebitis.  No focal fluid collection identified.  Signed,  Dulcy Fanny. Earleen Newport, DO  [facility]  [facility]   Electronically Signed   By: Corrie Mckusick O.D.   On: 09/19/2013 12:34    Anti-infectives: Anti-infectives   Start     Dose/Rate Route Frequency Ordered Stop   09/21/13 0000  doxycycline (VIBRAMYCIN) 100 MG capsule     100 mg Oral 2 times daily 09/21/13 0823     09/18/13 1000  vancomycin (VANCOCIN) 1,500 mg in sodium chloride 0.9 % 500 mL IVPB     1,500 mg 250 mL/hr over 120 Minutes Intravenous Every 12 hours 09/17/13 2048     09/17/13 2300  piperacillin-tazobactam (ZOSYN) IVPB 3.375 g     3.375 g 12.5 mL/hr over 240 Minutes Intravenous Every 8 hours 09/17/13 2048     09/17/13 2200  vancomycin (VANCOCIN) 2,000 mg in sodium chloride 0.9 % 500 mL IVPB     2,000 mg 250 mL/hr over 120 Minutes Intravenous  Once 09/17/13 2048 09/18/13 0125   09/17/13 1900  clindamycin (CLEOCIN) IVPB 600 mg     600 mg 100 mL/hr over 30 Minutes Intravenous  Once 09/17/13 1855 09/17/13 2007      Assessment/Plan:  Right thigh thrombophlebitis with cellulitis.  Improved.  I think he could go home on oral antibiotics. I will see in the office  in 1 to 2 weeks.  LOS: 4 days    Cheria Sadiq A 09/21/2013

## 2018-02-19 ENCOUNTER — Other Ambulatory Visit: Payer: Self-pay | Admitting: Adult Health

## 2018-02-19 ENCOUNTER — Ambulatory Visit
Admission: RE | Admit: 2018-02-19 | Discharge: 2018-02-19 | Disposition: A | Discharge status: Home / Self Care | Payer: BC Managed Care – PPO | Source: Ambulatory Visit | Attending: Adult Health | Admitting: Adult Health

## 2018-02-19 DIAGNOSIS — M545 Low back pain, unspecified: Secondary | ICD-10-CM

## 2018-02-19 DIAGNOSIS — N4400 Torsion of testis, unspecified: Secondary | ICD-10-CM | POA: Insufficient documentation

## 2019-04-24 ENCOUNTER — Other Ambulatory Visit: Payer: Self-pay | Admitting: Urology

## 2019-04-24 DIAGNOSIS — R310 Gross hematuria: Secondary | ICD-10-CM

## 2019-05-07 ENCOUNTER — Other Ambulatory Visit: Payer: Self-pay

## 2019-05-07 ENCOUNTER — Ambulatory Visit
Admission: RE | Admit: 2019-05-07 | Discharge: 2019-05-07 | Disposition: A | Discharge status: Home / Self Care | Payer: BC Managed Care – PPO | Source: Ambulatory Visit | Attending: Urology | Admitting: Urology

## 2019-05-07 DIAGNOSIS — R310 Gross hematuria: Secondary | ICD-10-CM | POA: Insufficient documentation

## 2019-05-07 MED ORDER — IOHEXOL 300 MG/ML  SOLN
125.0000 mL | Freq: Once | INTRAMUSCULAR | Status: AC | PRN
  Administered 2019-05-07: 125 mL via INTRAVENOUS

## 2019-05-16 ENCOUNTER — Other Ambulatory Visit: Payer: Self-pay

## 2019-05-16 ENCOUNTER — Ambulatory Visit
Admission: RE | Admit: 2019-05-16 | Discharge: 2019-05-16 | Disposition: A | Discharge status: Home / Self Care | Payer: BC Managed Care – PPO | Attending: Urology | Admitting: Urology

## 2019-05-16 ENCOUNTER — Encounter: Payer: Self-pay | Admitting: Urology

## 2019-05-16 ENCOUNTER — Encounter
Admission: RE | Disposition: A | Discharge status: Home / Self Care | Payer: Self-pay | Source: Home / Self Care | Attending: Urology

## 2019-05-16 DIAGNOSIS — N200 Calculus of kidney: Secondary | ICD-10-CM | POA: Insufficient documentation

## 2019-05-16 HISTORY — PX: EXTRACORPOREAL SHOCK WAVE LITHOTRIPSY: SHX1557

## 2019-05-16 SURGERY — LITHOTRIPSY, ESWL
Anesthesia: Moderate Sedation | Laterality: Right

## 2019-05-16 MED ORDER — MORPHINE SULFATE (PF) 10 MG/ML IV SOLN
INTRAVENOUS | Status: AC
  Filled 2019-05-16: qty 1

## 2019-05-16 MED ORDER — PROMETHAZINE HCL 25 MG/ML IJ SOLN: 25.0000 mg | Freq: Once | INTRAMUSCULAR | Status: AC

## 2019-05-16 MED ORDER — DEXTROSE-NACL 5-0.45 % IV SOLN: INTRAVENOUS | Status: DC

## 2019-05-16 MED ORDER — LEVOFLOXACIN 500 MG PO TABS: 500.0000 mg | ORAL_TABLET | Freq: Once | ORAL | Status: AC

## 2019-05-16 MED ORDER — PROMETHAZINE HCL 25 MG/ML IJ SOLN
INTRAMUSCULAR | Status: AC
  Filled 2019-05-16: qty 1

## 2019-05-16 MED ORDER — MORPHINE SULFATE (PF) 10 MG/ML IV SOLN
INTRAVENOUS | Status: AC
  Administered 2019-05-16: 10 mg via INTRAMUSCULAR
  Filled 2019-05-16: qty 1

## 2019-05-16 MED ORDER — NUCYNTA 50 MG PO TABS: 50.0000 mg | ORAL_TABLET | Freq: Four times a day (QID) | ORAL | 0 refills | Status: DC | PRN

## 2019-05-16 MED ORDER — TAMSULOSIN HCL 0.4 MG PO CAPS: 0.4000 mg | ORAL_CAPSULE | Freq: Every day | ORAL | 3 refills | Status: DC

## 2019-05-16 MED ORDER — FUROSEMIDE 10 MG/ML IJ SOLN: 10.0000 mg | Freq: Once | INTRAMUSCULAR | Status: AC

## 2019-05-16 MED ORDER — MIDAZOLAM HCL 2 MG/2ML IJ SOLN
INTRAMUSCULAR | Status: AC
  Administered 2019-05-16: 1 mg via INTRAMUSCULAR
  Filled 2019-05-16: qty 2

## 2019-05-16 MED ORDER — PROMETHAZINE HCL 25 MG/ML IJ SOLN
INTRAMUSCULAR | Status: AC
  Administered 2019-05-16: 25 mg via INTRAMUSCULAR
  Filled 2019-05-16: qty 1

## 2019-05-16 MED ORDER — DIPHENHYDRAMINE HCL 25 MG PO CAPS
ORAL_CAPSULE | ORAL | Status: AC
  Filled 2019-05-16: qty 1

## 2019-05-16 MED ORDER — DIPHENHYDRAMINE HCL 25 MG PO CAPS
ORAL_CAPSULE | ORAL | Status: AC
  Administered 2019-05-16: 25 mg via ORAL
  Filled 2019-05-16: qty 1

## 2019-05-16 MED ORDER — DIPHENHYDRAMINE HCL 25 MG PO CAPS
25.0000 mg | ORAL_CAPSULE | Freq: Once | ORAL | Status: AC
Start: 2019-05-16 — End: 2019-05-16

## 2019-05-16 MED ORDER — MIDAZOLAM HCL 2 MG/2ML IJ SOLN: 1.0000 mg | Freq: Once | INTRAMUSCULAR | Status: AC

## 2019-05-16 MED ORDER — LEVOFLOXACIN 500 MG PO TABS
ORAL_TABLET | ORAL | Status: AC
  Filled 2019-05-16: qty 1

## 2019-05-16 MED ORDER — FUROSEMIDE 10 MG/ML IJ SOLN
INTRAMUSCULAR | Status: AC
  Administered 2019-05-16: 10 mg via INTRAVENOUS
  Filled 2019-05-16: qty 2

## 2019-05-16 MED ORDER — LEVOFLOXACIN 500 MG PO TABS
ORAL_TABLET | ORAL | Status: AC
Start: 2019-05-16 — End: 2019-05-16
  Administered 2019-05-16: 500 mg via ORAL
  Filled 2019-05-16: qty 1

## 2019-05-16 MED ORDER — MIDAZOLAM HCL 2 MG/2ML IJ SOLN
INTRAMUSCULAR | Status: AC
  Filled 2019-05-16: qty 2

## 2019-05-16 MED ORDER — ONDANSETRON 8 MG PO TBDP: 8.0000 mg | ORAL_TABLET | Freq: Four times a day (QID) | ORAL | 3 refills | Status: DC | PRN

## 2019-05-16 MED ORDER — MORPHINE SULFATE (PF) 10 MG/ML IV SOLN: 10.0000 mg | Freq: Once | INTRAVENOUS | Status: AC

## 2019-05-16 NOTE — Discharge Instructions (Signed)
Kidney Stones  Kidney stones are solid, rock-like deposits that form inside of the kidneys. The kidneys are a pair of organs that make urine. A kidney stone may form in a kidney and move into other parts of the urinary tract, including the tubes that connect the kidneys to the bladder (ureters), the bladder, and the tube that carries urine out of the body (urethra). As the stone moves through these areas, it can cause intense pain and block the flow of urine. Kidney stones are created when high levels of certain minerals are found in the urine. The stones are usually passed out of the body through urination, but in some cases, medical treatment may be needed to remove them. What are the causes? Kidney stones may be caused by:  A condition in which certain glands produce too much parathyroid hormone (primary hyperparathyroidism), which causes too much calcium buildup in the blood.  A buildup of uric acid crystals in the bladder (hyperuricosuria). Uric acid is a chemical that the body produces when you eat certain foods. It usually exits the body in the urine.  Narrowing (stricture) of one or both of the ureters.  A kidney blockage that is present at birth (congenital obstruction).  Past surgery on the kidney or the ureters, such as gastric bypass surgery. What increases the risk? The following factors may make you more likely to develop this condition:  Having had a kidney stone in the past.  Having a family history of kidney stones.  Not drinking enough water.  Eating a diet that is high in protein, salt (sodium), or sugar.  Being overweight or obese. What are the signs or symptoms? Symptoms of a kidney stone may include:  Pain in the side of the abdomen, right below the ribs (flank pain). Pain usually spreads (radiates) to the groin.  Needing to urinate frequently or urgently.  Painful urination.  Blood in the urine (hematuria).  Nausea.  Vomiting.  Fever and  chills. How is this diagnosed? This condition may be diagnosed based on:  Your symptoms and medical history.  A physical exam.  Blood tests.  Urine tests. These may be done before and after the stone passes out of your body through urination.  Imaging tests, such as a CT scan, abdominal X-ray, or ultrasound.  A procedure to examine the inside of the bladder (cystoscopy). How is this treated? Treatment for kidney stones depends on the size, location, and makeup of the stones. Kidney stones will often pass out of the body through urination. You may need to:  Increase your fluid intake to help pass the stone. In some cases, you may be given fluids through an IV and may need to be monitored at the hospital.  Take medicine for pain.  Make changes in your diet to help prevent kidney stones from coming back. Sometimes, medical procedures are needed to remove a kidney stone. This may involve:  A procedure to break up kidney stones using: ? A focused beam of light (laser therapy). ? Shock waves (extracorporeal shock wave lithotripsy).  Surgery to remove kidney stones. This may be needed if you have severe pain or have stones that block your urinary tract. Follow these instructions at home: Medicines  Take over-the-counter and prescription medicines only as told by your health care provider.  Ask your health care provider if the medicine prescribed to you requires you to avoid driving or using heavy machinery. Eating and drinking  Drink enough fluid to keep your urine pale  yellow. You may be instructed to drink at least 8-10 glasses of water each day. This will help you pass the kidney stone.  If directed, change your diet. This may include: ? Limiting how much sodium you eat. ? Eating more fruits and vegetables. ? Limiting how much animal protein--such as red meat, poultry, fish, and eggs--you eat.  Follow instructions from your health care provider about eating or drinking  restrictions. General instructions  Collect urine samples as told by your health care provider. You may need to collect a urine sample: ? 24 hours after you pass the stone. ? 8-12 weeks after passing the kidney stone, and every 6-12 months after that.  Strain your urine every time you urinate, for as long as directed. Use the strainer that your health care provider recommends.  Do not throw out the kidney stone after passing it. Keep the stone so it can be tested by your health care provider. Testing the makeup of your kidney stone may help prevent you from getting kidney stones in the future.  Keep all follow-up visits as told by your health care provider. This is important. You may need follow-up X-rays or ultrasounds to make sure that your stone has passed. How is this prevented? To prevent another kidney stone:  Drink enough fluid to keep your urine pale yellow. This is the best way to prevent kidney stones.  Eat a healthy diet and follow recommendations from your health care provider about foods to avoid. You may be instructed to eat a low-protein diet. Recommendations vary depending on the type of kidney stone that you have.  Maintain a healthy weight. Where to find more information  [place] ([facility]): www.kidney.[facility]): www.urologyhealth.org Contact a health care provider if:  You have pain that gets worse or does not get better with medicine. Get help right away if:  You have a fever or chills.  You develop severe pain.  You develop new abdominal pain.  You faint.  You are unable to urinate. Summary  Kidney stones are solid, rock-like deposits that form inside of the kidneys.  Kidney stones can cause nausea, vomiting, blood in the urine, abdominal pain, and the urge to urinate frequently.  Treatment for kidney stones depends on the size, location, and makeup of the stones. Kidney stones will often pass out of the body  through urination.  Kidney stones can be prevented by drinking enough fluids, eating a healthy diet, and maintaining a healthy weight. This information is not intended to replace advice given to you by your health care provider. Make sure you discuss any questions you have with your health care provider. Document Revised: 05/15/2018 Document Reviewed: 05/15/2018 Elsevier Patient Education  [place] After This sheet gives you information about how to care for yourself after your procedure. Your health care provider may also give you more specific instructions. If you have problems or questions, contact your health care provider. What can I expect after the procedure? After the procedure, it is common to have:  Some blood in your urine. This should only last for a few days.  Soreness in your back, sides, or upper abdomen for a few days.  Blotches or bruises on your back where the pressure wave entered the skin.  Pain, discomfort, or nausea when pieces (fragments) of the kidney stone move through the tube that carries urine from the kidney to the bladder (ureter). Stone fragments may pass soon after the procedure,  but they may continue to pass for up to 4-8 weeks. ? If you have severe pain or nausea, contact your health care provider. This may be caused by a large stone that was not broken up, and this may mean that you need more treatment.  Some pain or discomfort during urination.  Some pain or discomfort in the lower abdomen or (in men) at the base of the penis. Follow these instructions at home: Medicines  Take over-the-counter and prescription medicines only as told by your health care provider.  If you were prescribed an antibiotic medicine, take it as told by your health care provider. Do not stop taking the antibiotic even if you start to feel better.  Do not drive for 24 hours if you were given a medicine to help you relax (sedative).  Do not drive  or use heavy machinery while taking prescription pain medicine. Eating and drinking      Drink enough water and fluids to keep your urine clear or pale yellow. This helps any remaining pieces of the stone to pass. It can also help prevent new stones from forming.  Eat plenty of fresh fruits and vegetables.  Follow instructions from your health care provider about eating and drinking restrictions. You may be instructed: ? To reduce how much salt (sodium) you eat or drink. Check ingredients and nutrition facts on packaged foods and beverages. ? To reduce how much meat you eat.  Eat the recommended amount of calcium for your age and gender. Ask your health care provider how much calcium you should have. General instructions  Get plenty of rest.  Most people can resume normal activities 1-2 days after the procedure. Ask your health care provider what activities are safe for you.  Your health care provider may direct you to lie in a certain position (postural drainage) and tap firmly (percuss) over your kidney area to help stone fragments pass. Follow instructions as told by your health care provider.  If directed, strain all urine through the strainer that was provided by your health care provider. ? Keep all fragments for your health care provider to see. Any stones that are found may be sent to a medical lab for examination. The stone may be as small as a grain of salt.  Keep all follow-up visits as told by your health care provider. This is important. Contact a health care provider if:  You have pain that is severe or does not get better with medicine.  You have nausea that is severe or does not go away.  You have blood in your urine longer than your health care provider told you to expect.  You have more blood in your urine.  You have pain during urination that does not go away.  You urinate more frequently than usual and this does not go away.  You develop a rash or any  other possible signs of an allergic reaction. Get help right away if:  You have severe pain in your back, sides, or upper abdomen.  You have severe pain while urinating.  Your urine is very dark red.  You have blood in your stool (feces).  You cannot pass any urine at all.  You feel a strong urge to urinate after emptying your bladder.  You have a fever or chills.  You develop shortness of breath, difficulty breathing, or chest pain.  You have severe nausea that leads to persistent vomiting.  You faint. Summary  After this procedure, it is common  to have some pain, discomfort, or nausea when pieces (fragments) of the kidney stone move through the tube that carries urine from the kidney to the bladder (ureter). If this pain or nausea is severe, however, you should contact your health care provider.  Most people can resume normal activities 1-2 days after the procedure. Ask your health care provider what activities are safe for you.  Drink enough water and fluids to keep your urine clear or pale yellow. This helps any remaining pieces of the stone to pass, and it can help prevent new stones from forming.  If directed, strain your urine and keep all fragments for your health care provider to see. Fragments or stones may be as small as a grain of salt.  Get help right away if you have severe pain in your back, sides, or upper abdomen or have severe pain while urinating. This information is not intended to replace advice given to you by your health care provider. Make sure you discuss any questions you have with your health care provider. Document Revised: 04/09/2018 Document Reviewed: 11/18/2015 Elsevier Patient Education  2020 [employer].

## 2019-07-18 ENCOUNTER — Encounter
Admission: RE | Disposition: A | Discharge status: Home / Self Care | Payer: Self-pay | Source: Home / Self Care | Attending: Urology

## 2019-07-18 ENCOUNTER — Encounter: Payer: Self-pay | Admitting: Urology

## 2019-07-18 ENCOUNTER — Ambulatory Visit
Admission: RE | Admit: 2019-07-18 | Discharge: 2019-07-18 | Disposition: A | Discharge status: Home / Self Care | Payer: BC Managed Care – PPO | Attending: Urology | Admitting: Urology

## 2019-07-18 ENCOUNTER — Other Ambulatory Visit: Payer: Self-pay

## 2019-07-18 DIAGNOSIS — E119 Type 2 diabetes mellitus without complications: Secondary | ICD-10-CM | POA: Insufficient documentation

## 2019-07-18 DIAGNOSIS — Z86718 Personal history of other venous thrombosis and embolism: Secondary | ICD-10-CM | POA: Diagnosis not present

## 2019-07-18 DIAGNOSIS — Z833 Family history of diabetes mellitus: Secondary | ICD-10-CM | POA: Diagnosis not present

## 2019-07-18 DIAGNOSIS — N200 Calculus of kidney: Secondary | ICD-10-CM | POA: Insufficient documentation

## 2019-07-18 DIAGNOSIS — E669 Obesity, unspecified: Secondary | ICD-10-CM | POA: Insufficient documentation

## 2019-07-18 DIAGNOSIS — Z880 Allergy status to penicillin: Secondary | ICD-10-CM | POA: Insufficient documentation

## 2019-07-18 HISTORY — PX: EXTRACORPOREAL SHOCK WAVE LITHOTRIPSY: SHX1557

## 2019-07-18 SURGERY — LITHOTRIPSY, ESWL
Anesthesia: Moderate Sedation | Laterality: Right

## 2019-07-18 MED ORDER — MORPHINE SULFATE (PF) 10 MG/ML IV SOLN
INTRAVENOUS | Status: AC
  Administered 2019-07-18: 10 mg via INTRAMUSCULAR
  Filled 2019-07-18: qty 1

## 2019-07-18 MED ORDER — FUROSEMIDE 10 MG/ML IJ SOLN
10.0000 mg | Freq: Once | INTRAMUSCULAR | Status: AC
  Administered 2019-07-18: 10 mg via INTRAVENOUS

## 2019-07-18 MED ORDER — LEVOFLOXACIN 500 MG PO TABS
ORAL_TABLET | ORAL | Status: AC
  Administered 2019-07-18: 500 mg via ORAL
  Filled 2019-07-18: qty 1

## 2019-07-18 MED ORDER — LEVOFLOXACIN 500 MG PO TABS: 500.0000 mg | ORAL_TABLET | Freq: Every day | ORAL | Status: DC

## 2019-07-18 MED ORDER — CIPROFLOXACIN HCL 500 MG PO TABS
500.0000 mg | ORAL_TABLET | Freq: Two times a day (BID) | ORAL | 0 refills | Status: DC
Start: 2019-07-18 — End: 2020-04-17

## 2019-07-18 MED ORDER — PROMETHAZINE HCL 25 MG/ML IJ SOLN
INTRAMUSCULAR | Status: AC
  Administered 2019-07-18: 25 mg via INTRAMUSCULAR
  Filled 2019-07-18: qty 1

## 2019-07-18 MED ORDER — MIDAZOLAM HCL 2 MG/2ML IJ SOLN: 1.0000 mg | Freq: Once | INTRAMUSCULAR | Status: AC

## 2019-07-18 MED ORDER — DIPHENHYDRAMINE HCL 25 MG PO CAPS
ORAL_CAPSULE | ORAL | Status: AC
  Administered 2019-07-18: 25 mg via ORAL
  Filled 2019-07-18: qty 1

## 2019-07-18 MED ORDER — FUROSEMIDE 10 MG/ML IJ SOLN
INTRAMUSCULAR | Status: AC
  Filled 2019-07-18: qty 2

## 2019-07-18 MED ORDER — MIDAZOLAM HCL 2 MG/2ML IJ SOLN
INTRAMUSCULAR | Status: AC
  Administered 2019-07-18: 1 mg via INTRAMUSCULAR
  Filled 2019-07-18: qty 2

## 2019-07-18 MED ORDER — PROMETHAZINE HCL 25 MG/ML IJ SOLN: 25.0000 mg | Freq: Once | INTRAMUSCULAR | Status: AC

## 2019-07-18 MED ORDER — DEXTROSE-NACL 5-0.45 % IV SOLN: INTRAVENOUS | Status: DC

## 2019-07-18 MED ORDER — MORPHINE SULFATE (PF) 10 MG/ML IV SOLN: 10.0000 mg | Freq: Once | INTRAVENOUS | Status: AC

## 2019-07-18 MED ORDER — DIPHENHYDRAMINE HCL 25 MG PO CAPS: 25.0000 mg | ORAL_CAPSULE | Freq: Once | ORAL | Status: AC

## 2019-07-18 NOTE — Discharge Instructions (Addendum)
Lithotripsy, Care After This sheet gives you information about how to care for yourself after your procedure. Your health care provider may also give you more specific instructions. If you have problems or questions, contact your health care provider. What can I expect after the procedure? After the procedure, it is common to have:  Some blood in your urine. This should only last for a few days.  Soreness in your back, sides, or upper abdomen for a few days.  Blotches or bruises on your back where the pressure wave entered the skin.  Pain, discomfort, or nausea when pieces (fragments) of the kidney stone move through the tube that carries urine from the kidney to the bladder (ureter). Stone fragments may pass soon after the procedure, but they may continue to pass for up to 4-8 weeks. ? If you have severe pain or nausea, contact your health care provider. This may be caused by a large stone that was not broken up, and this may mean that you need more treatment.  Some pain or discomfort during urination.  Some pain or discomfort in the lower abdomen or (in men) at the base of the penis. Follow these instructions at home: Medicines  Take over-the-counter and prescription medicines only as told by your health care provider.  If you were prescribed an antibiotic medicine, take it as told by your health care provider. Do not stop taking the antibiotic even if you start to feel better.  Do not drive for 24 hours if you were given a medicine to help you relax (sedative).  Do not drive or use heavy machinery while taking prescription pain medicine. Eating and drinking      Drink enough water and fluids to keep your urine clear or pale yellow. This helps any remaining pieces of the stone to pass. It can also help prevent new stones from forming.  Eat plenty of fresh fruits and vegetables.  Follow instructions from your health care provider about eating and drinking restrictions. You may be  instructed: ? To reduce how much salt (sodium) you eat or drink. Check ingredients and nutrition facts on packaged foods and beverages. ? To reduce how much meat you eat.  Eat the recommended amount of calcium for your age and gender. Ask your health care provider how much calcium you should have. General instructions  Get plenty of rest.  Most people can resume normal activities 1-2 days after the procedure. Ask your health care provider what activities are safe for you.  Your health care provider may direct you to lie in a certain position (postural drainage) and tap firmly (percuss) over your kidney area to help stone fragments pass. Follow instructions as told by your health care provider.  If directed, strain all urine through the strainer that was provided by your health care provider. ? Keep all fragments for your health care provider to see. Any stones that are found may be sent to a medical lab for examination. The stone may be as small as a grain of salt.  Keep all follow-up visits as told by your health care provider. This is important. Contact a health care provider if:  You have pain that is severe or does not get better with medicine.  You have nausea that is severe or does not go away.  You have blood in your urine longer than your health care provider told you to expect.  You have more blood in your urine.  You have pain during urination that does   not go away.  You urinate more frequently than usual and this does not go away.  You develop a rash or any other possible signs of an allergic reaction. Get help right away if:  You have severe pain in your back, sides, or upper abdomen.  You have severe pain while urinating.  Your urine is very dark red.  You have blood in your stool (feces).  You cannot pass any urine at all.  You feel a strong urge to urinate after emptying your bladder.  You have a fever or chills.  You develop shortness of breath,  difficulty breathing, or chest pain.  You have severe nausea that leads to persistent vomiting.  You faint. Summary  After this procedure, it is common to have some pain, discomfort, or nausea when pieces (fragments) of the kidney stone move through the tube that carries urine from the kidney to the bladder (ureter). If this pain or nausea is severe, however, you should contact your health care provider.  Most people can resume normal activities 1-2 days after the procedure. Ask your health care provider what activities are safe for you.  Drink enough water and fluids to keep your urine clear or pale yellow. This helps any remaining pieces of the stone to pass, and it can help prevent new stones from forming.  If directed, strain your urine and keep all fragments for your health care provider to see. Fragments or stones may be as small as a grain of salt.  Get help right away if you have severe pain in your back, sides, or upper abdomen or have severe pain while urinating. This information is not intended to replace advice given to you by your health care provider. Make sure you discuss any questions you have with your health care provider. Document Revised: 04/09/2018 Document Reviewed: 11/18/2015 Elsevier Patient Education  2020 [place]   1) The drugs that you were given will stay in your system until tomorrow so for the next 24 hours you should not:  A) Drive an automobile B) Make any legal decisions C) Drink any alcoholic beverage   2) You may resume regular meals tomorrow.  Today it is better to start with liquids and gradually work up to solid foods.  You may eat anything you prefer, but it is better to start with liquids, then soup and crackers, and gradually work up to solid foods.   3) Please notify your doctor immediately if you have any unusual bleeding, trouble breathing, redness and pain at the surgery site,  drainage, fever, or pain not relieved by medication.    4) Additional Instructions:   FOLLOW [facility] DISCHARGE INSTRUCTION SHEET AS REVIEWED.        Please contact your physician with any problems or Same Day Surgery at [phone], Monday through Friday 6 am to 4 pm, or [place] at [facility] number at [phone].

## 2019-07-19 ENCOUNTER — Encounter: Payer: Self-pay | Admitting: Urology

## 2019-10-22 ENCOUNTER — Other Ambulatory Visit: Payer: BC Managed Care – PPO

## 2019-10-24 ENCOUNTER — Encounter
Admission: RE | Disposition: A | Discharge status: Home / Self Care | Payer: Self-pay | Source: Home / Self Care | Attending: Urology

## 2019-10-24 ENCOUNTER — Other Ambulatory Visit: Payer: Self-pay

## 2019-10-24 ENCOUNTER — Encounter: Payer: Self-pay | Admitting: Urology

## 2019-10-24 ENCOUNTER — Ambulatory Visit
Admission: RE | Admit: 2019-10-24 | Discharge: 2019-10-24 | Disposition: A | Discharge status: Home / Self Care | Payer: BC Managed Care – PPO | Attending: Urology | Admitting: Urology

## 2019-10-24 DIAGNOSIS — N200 Calculus of kidney: Secondary | ICD-10-CM | POA: Insufficient documentation

## 2019-10-24 DIAGNOSIS — E669 Obesity, unspecified: Secondary | ICD-10-CM | POA: Diagnosis not present

## 2019-10-24 DIAGNOSIS — Z6841 Body Mass Index (BMI) 40.0 and over, adult: Secondary | ICD-10-CM | POA: Diagnosis not present

## 2019-10-24 HISTORY — PX: EXTRACORPOREAL SHOCK WAVE LITHOTRIPSY: SHX1557

## 2019-10-24 LAB — GLUCOSE, CAPILLARY: Glucose-Capillary: 145 mg/dL — ABNORMAL HIGH (ref 70–99)

## 2019-10-24 SURGERY — LITHOTRIPSY, ESWL
Anesthesia: Moderate Sedation | Laterality: Left

## 2019-10-24 MED ORDER — MIDAZOLAM HCL 2 MG/2ML IJ SOLN: 1.0000 mg | Freq: Once | INTRAMUSCULAR | Status: AC

## 2019-10-24 MED ORDER — MORPHINE SULFATE (PF) 2 MG/ML IV SOLN
10.0000 mg | Freq: Once | INTRAVENOUS | Status: DC
  Filled 2019-10-24: qty 5

## 2019-10-24 MED ORDER — MIDAZOLAM HCL 2 MG/2ML IJ SOLN
INTRAMUSCULAR | Status: AC
  Administered 2019-10-24: 1 mg via INTRAMUSCULAR
  Filled 2019-10-24: qty 2

## 2019-10-24 MED ORDER — DIPHENHYDRAMINE HCL 25 MG PO CAPS
ORAL_CAPSULE | ORAL | Status: AC
  Administered 2019-10-24: 25 mg via ORAL
  Filled 2019-10-24: qty 1

## 2019-10-24 MED ORDER — LEVOFLOXACIN 500 MG PO TABS: 500.0000 mg | ORAL_TABLET | Freq: Once | ORAL | Status: AC

## 2019-10-24 MED ORDER — PROMETHAZINE HCL 25 MG/ML IJ SOLN
INTRAMUSCULAR | Status: AC
  Administered 2019-10-24: 25 mg via INTRAMUSCULAR
  Filled 2019-10-24: qty 1

## 2019-10-24 MED ORDER — PROMETHAZINE HCL 25 MG/ML IJ SOLN: 25.0000 mg | Freq: Once | INTRAMUSCULAR | Status: AC

## 2019-10-24 MED ORDER — DEXTROSE-NACL 5-0.45 % IV SOLN: INTRAVENOUS | Status: DC

## 2019-10-24 MED ORDER — CIPROFLOXACIN HCL 500 MG PO TABS
500.0000 mg | ORAL_TABLET | Freq: Two times a day (BID) | ORAL | 0 refills | Status: DC
Start: 2019-10-24 — End: 2020-04-17

## 2019-10-24 MED ORDER — NUCYNTA 50 MG PO TABS: 50.0000 mg | ORAL_TABLET | Freq: Four times a day (QID) | ORAL | 0 refills | Status: DC | PRN

## 2019-10-24 MED ORDER — LEVOFLOXACIN 500 MG PO TABS
ORAL_TABLET | ORAL | Status: AC
  Administered 2019-10-24: 500 mg via ORAL
  Filled 2019-10-24: qty 1

## 2019-10-24 MED ORDER — FUROSEMIDE 10 MG/ML IJ SOLN
INTRAMUSCULAR | Status: AC
  Administered 2019-10-24: 10 mg via INTRAVENOUS
  Filled 2019-10-24: qty 2

## 2019-10-24 MED ORDER — FUROSEMIDE 10 MG/ML IJ SOLN: 10.0000 mg | Freq: Once | INTRAMUSCULAR | Status: AC

## 2019-10-24 MED ORDER — DIPHENHYDRAMINE HCL 25 MG PO CAPS: 25.0000 mg | ORAL_CAPSULE | Freq: Once | ORAL | Status: AC

## 2019-10-24 MED ORDER — MORPHINE SULFATE (PF) 10 MG/ML IV SOLN
INTRAVENOUS | Status: AC
  Administered 2019-10-24: 10 mg via INTRAMUSCULAR
  Filled 2019-10-24: qty 1

## 2019-10-24 NOTE — Discharge Instructions (Signed)
Lithotripsy, Care After This sheet gives you information about how to care for yourself after your procedure. Your health care provider may also give you more specific instructions. If you have problems or questions, contact your health care provider. What can I expect after the procedure? After the procedure, it is common to have:  Some blood in your urine. This should only last for a few days.  Soreness in your back, sides, or upper abdomen for a few days.  Blotches or bruises on your back where the pressure wave entered the skin.  Pain, discomfort, or nausea when pieces (fragments) of the kidney stone move through the tube that carries urine from the kidney to the bladder (ureter). Stone fragments may pass soon after the procedure, but they may continue to pass for up to 4-8 weeks. ? If you have severe pain or nausea, contact your health care provider. This may be caused by a large stone that was not broken up, and this may mean that you need more treatment.  Some pain or discomfort during urination.  Some pain or discomfort in the lower abdomen or (in men) at the base of the penis. Follow these instructions at home: Medicines  Take over-the-counter and prescription medicines only as told by your health care provider.  If you were prescribed an antibiotic medicine, take it as told by your health care provider. Do not stop taking the antibiotic even if you start to feel better.  Do not drive for 24 hours if you were given a medicine to help you relax (sedative).  Do not drive or use heavy machinery while taking prescription pain medicine. Eating and drinking      Drink enough water and fluids to keep your urine clear or pale yellow. This helps any remaining pieces of the stone to pass. It can also help prevent new stones from forming.  Eat plenty of fresh fruits and vegetables.  Follow instructions from your health care provider about eating and drinking restrictions. You may be  instructed: ? To reduce how much salt (sodium) you eat or drink. Check ingredients and nutrition facts on packaged foods and beverages. ? To reduce how much meat you eat.  Eat the recommended amount of calcium for your age and gender. Ask your health care provider how much calcium you should have. General instructions  Get plenty of rest.  Most people can resume normal activities 1-2 days after the procedure. Ask your health care provider what activities are safe for you.  Your health care provider may direct you to lie in a certain position (postural drainage) and tap firmly (percuss) over your kidney area to help stone fragments pass. Follow instructions as told by your health care provider.  If directed, strain all urine through the strainer that was provided by your health care provider. ? Keep all fragments for your health care provider to see. Any stones that are found may be sent to a medical lab for examination. The stone may be as small as a grain of salt.  Keep all follow-up visits as told by your health care provider. This is important. Contact a health care provider if:  You have pain that is severe or does not get better with medicine.  You have nausea that is severe or does not go away.  You have blood in your urine longer than your health care provider told you to expect.  You have more blood in your urine.  You have pain during urination that does   not go away.  You urinate more frequently than usual and this does not go away.  You develop a rash or any other possible signs of an allergic reaction. Get help right away if:  You have severe pain in your back, sides, or upper abdomen.  You have severe pain while urinating.  Your urine is very dark red.  You have blood in your stool (feces).  You cannot pass any urine at all.  You feel a strong urge to urinate after emptying your bladder.  You have a fever or chills.  You develop shortness of breath,  difficulty breathing, or chest pain.  You have severe nausea that leads to persistent vomiting.  You faint. Summary  After this procedure, it is common to have some pain, discomfort, or nausea when pieces (fragments) of the kidney stone move through the tube that carries urine from the kidney to the bladder (ureter). If this pain or nausea is severe, however, you should contact your health care provider.  Most people can resume normal activities 1-2 days after the procedure. Ask your health care provider what activities are safe for you.  Drink enough water and fluids to keep your urine clear or pale yellow. This helps any remaining pieces of the stone to pass, and it can help prevent new stones from forming.  If directed, strain your urine and keep all fragments for your health care provider to see. Fragments or stones may be as small as a grain of salt.  Get help right away if you have severe pain in your back, sides, or upper abdomen or have severe pain while urinating. This information is not intended to replace advice given to you by your health care provider. Make sure you discuss any questions you have with your health care provider. Document Revised: 04/09/2018 Document Reviewed: 11/18/2015 Elsevier Patient Education  2020 [employer]  

## 2019-10-25 ENCOUNTER — Encounter: Payer: Self-pay | Admitting: Urology

## 2019-12-10 ENCOUNTER — Telehealth (HOSPITAL_COMMUNITY): Payer: Self-pay | Admitting: Emergency Medicine

## 2019-12-10 ENCOUNTER — Other Ambulatory Visit: Payer: Self-pay | Admitting: Nurse Practitioner

## 2019-12-10 DIAGNOSIS — U071 COVID-19: Secondary | ICD-10-CM

## 2019-12-10 NOTE — Progress Notes (Signed)
I connected by phone with Nicholas [name] on 12/10/2019 at 1:12 PM to discuss the potential use of a treatment for mild to moderate COVID-19 viral infection in non-hospitalized patients.  This patient is a 49 y.o. male that meets the FDA criteria for Emergency Use Authorization of bamlanivimab/etesevimab, casirivimab\imdevimab, or sotrovimab  Has a (+) direct SARS-CoV-2 viral test result  Has mild or moderate COVID-19   Is ? 47 years of age and weighs ? 40 kg  Is NOT hospitalized due to COVID-19  Is NOT requiring oxygen therapy or requiring an increase in baseline oxygen flow rate due to COVID-19  Is within 10 days of symptom onset  Has at least one of the high risk factor(s) for progression to severe COVID-19 and/or hospitalization as defined in EUA.  Specific high risk criteria : BMI > 25, Diabetes and Cardiovascular disease or hypertension   I have spoken and communicated the following to the patient or parent/caregiver:  1. FDA has authorized the emergency use of bamlanivimab/etesevimab, casirivimab\imdevimab, or sotrovimab for the treatment of mild to moderate COVID-19 in adults and pediatric patients with positive results of direct SARS-CoV-2 viral testing who are 39 years of age and older weighing at least 40 kg, and who are at high risk for progressing to severe COVID-19 and/or hospitalization.  2. The significant known and potential risks and benefits of bamlanivimab/etesevimab, casirivimab\imdevimab, or sotrovimab, and the extent to which such potential risks and benefits are unknown.  3. Information on available alternative treatments and the risks and benefits of those alternatives, including clinical trials.  4. Patients treated with bamlanivimab/etesevimab, casirivimab\imdevimab, or sotrovimab should continue to self-isolate and use infection control measures (e.g., wear mask, isolate, social distance, avoid sharing personal items, clean and disinfect "high touch"  surfaces, and frequent handwashing) according to CDC guidelines.   5. The patient or parent/caregiver has the option to accept or refuse bamlanivimab/etesevimab, casirivimab\imdevimab, or sotrovimab.  After reviewing this information with the patient, the patient has agreed to receive one of the available covid 19 monoclonal antibodies and will be provided an appropriate fact sheet prior to infusion.Beckey Rutter, [place], AGNP-C [phone] ([place])

## 2019-12-10 NOTE — Telephone Encounter (Signed)
Called pt and explained possible monoclonal antibody treatment. Sx started 11/25. Tested positive 11/28 at [facility], they said they would fax his positive test result. Sx include cough, wheezing, body aches, nausea, vomiting, no bowel movement since 11/25, fatigue, and weakness. Qualifying risk factors include DM2 and BMI 48.1. Pt is not vaccinated. Pt interested in tx. Informed pt an APP will call back to possibly schedule an appointment.

## 2019-12-11 ENCOUNTER — Other Ambulatory Visit (HOSPITAL_COMMUNITY): Payer: Self-pay

## 2019-12-11 ENCOUNTER — Ambulatory Visit (HOSPITAL_COMMUNITY)
Admission: RE | Admit: 2019-12-11 | Discharge: 2019-12-11 | Disposition: A | Discharge status: Home / Self Care | Payer: BC Managed Care – PPO | Source: Ambulatory Visit | Attending: Pulmonary Disease | Admitting: Pulmonary Disease

## 2019-12-11 DIAGNOSIS — U071 COVID-19: Secondary | ICD-10-CM | POA: Diagnosis present

## 2019-12-11 MED ORDER — EPINEPHRINE 0.3 MG/0.3ML IJ SOAJ: 0.3000 mg | Freq: Once | INTRAMUSCULAR | Status: DC | PRN

## 2019-12-11 MED ORDER — SODIUM CHLORIDE 0.9 % IV SOLN: INTRAVENOUS | Status: DC | PRN

## 2019-12-11 MED ORDER — SOTROVIMAB 500 MG/8ML IV SOLN
500.0000 mg | Freq: Once | INTRAVENOUS | Status: AC
  Administered 2019-12-11: 500 mg via INTRAVENOUS

## 2019-12-11 MED ORDER — ALBUTEROL SULFATE HFA 108 (90 BASE) MCG/ACT IN AERS: 2.0000 | INHALATION_SPRAY | Freq: Once | RESPIRATORY_TRACT | Status: DC | PRN

## 2019-12-11 MED ORDER — DIPHENHYDRAMINE HCL 50 MG/ML IJ SOLN: 50.0000 mg | Freq: Once | INTRAMUSCULAR | Status: DC | PRN

## 2019-12-11 MED ORDER — ACETAMINOPHEN 325 MG PO TABS
650.0000 mg | ORAL_TABLET | Freq: Once | ORAL | Status: AC
  Administered 2019-12-11: 650 mg via ORAL
  Filled 2019-12-11: qty 2

## 2019-12-11 MED ORDER — METHYLPREDNISOLONE SODIUM SUCC 125 MG IJ SOLR: 125.0000 mg | Freq: Once | INTRAMUSCULAR | Status: DC | PRN

## 2019-12-11 MED ORDER — FAMOTIDINE IN NACL 20-0.9 MG/50ML-% IV SOLN: 20.0000 mg | Freq: Once | INTRAVENOUS | Status: DC | PRN

## 2019-12-11 NOTE — Progress Notes (Signed)
Patient reviewed Fact Sheet for Patients, Parents, and Caregivers for Emergency Use Authorization (EUA) of Sotrovimab for the Treatment of Coronavirus. Patient also reviewed and is agreeable to the estimated cost of treatment. Patient is agreeable to proceed.   

## 2019-12-11 NOTE — Discharge Instructions (Signed)
10 Things You Can Do to Manage Your COVID-19 Symptoms at Home If you have possible or confirmed COVID-19: 1. Stay home from work and school. And stay away from other public places. If you must go out, avoid using any kind of public transportation, ridesharing, or taxis. 2. Monitor your symptoms carefully. If your symptoms get worse, call your healthcare provider immediately. 3. Get rest and stay hydrated. 4. If you have a medical appointment, call the healthcare provider ahead of time and tell them that you have or may have COVID-19. 5. For medical emergencies, call 911 and notify the dispatch personnel that you have or may have COVID-19. 6. Cover your cough and sneezes with a tissue or use the inside of your elbow. 7. Wash your hands often with soap and water for at least 20 seconds or clean your hands with an alcohol-based hand sanitizer that contains at least 60% alcohol. 8. As much as possible, stay in a specific room and away from other people in your home. Also, you should use a separate bathroom, if available. If you need to be around other people in or outside of the home, wear a mask. 9. Avoid sharing personal items with other people in your household, like dishes, towels, and bedding. 10. Clean all surfaces that are touched often, like counters, tabletops, and doorknobs. Use household cleaning sprays or wipes according to the label instructions. cdc.gov/coronavirus 07/11/2018 This information is not intended to replace advice given to you by your health care provider. Make sure you discuss any questions you have with your health care provider. Document Revised: 12/13/2018 Document Reviewed: 12/13/2018 Elsevier Patient Education  2020 Elsevier Inc.  What types of side effects do monoclonal antibody drugs cause?  Common side effects  In general, the more common side effects caused by monoclonal antibody drugs include: . Allergic reactions, such as hives or itching . Flu-like signs and  symptoms, including chills, fatigue, fever, and muscle aches and pains . Nausea, vomiting . Diarrhea . Skin rashes . Low blood pressure   The CDC is recommending patients who receive monoclonal antibody treatments wait at least 90 days before being vaccinated.  Currently, there are no data on the safety and efficacy of mRNA COVID-19 vaccines in persons who received monoclonal antibodies or convalescent plasma as part of COVID-19 treatment. Based on the estimated half-life of such therapies as well as evidence suggesting that reinfection is uncommon in the 90 days after initial infection, vaccination should be deferred for at least 90 days, as a precautionary measure until additional information becomes available, to avoid interference of the antibody treatment with vaccine-induced immune responses.  If you have any questions or concerns after the infusion please call the Advanced Practice Provider on call at [phone]. This number is only intended for your use regarding questions or concerns about the infusion post-treatment side-effects.  Please do not provide this number to others for use.   If someone you know is interested in receiving treatment please have them call the COVID hotline at [phone].   

## 2019-12-11 NOTE — Progress Notes (Signed)
Diagnosis: COVID-19  Physician: Dr. Patrick Wright  Procedure: [facility] Infusion [facility] Med: Sotrovimab infusion - Provided patient with sotrovimab fact sheet for patients, parents, and caregivers prior to infusion.   Complications: No immediate complications noted  Discharge: Discharged home    

## 2019-12-16 ENCOUNTER — Other Ambulatory Visit: Payer: Self-pay | Admitting: Family Medicine

## 2019-12-16 ENCOUNTER — Other Ambulatory Visit: Payer: Self-pay

## 2019-12-16 ENCOUNTER — Other Ambulatory Visit: Payer: Self-pay | Admitting: Family

## 2019-12-16 ENCOUNTER — Ambulatory Visit: Admission: RE | Admit: 2019-12-16 | Payer: BC Managed Care – PPO | Source: Ambulatory Visit

## 2019-12-16 ENCOUNTER — Ambulatory Visit
Admission: RE | Admit: 2019-12-16 | Discharge: 2019-12-16 | Disposition: A | Discharge status: Home / Self Care | Payer: BC Managed Care – PPO | Source: Ambulatory Visit | Attending: Family | Admitting: Family

## 2019-12-16 DIAGNOSIS — U099 Post covid-19 condition, unspecified: Secondary | ICD-10-CM

## 2019-12-16 DIAGNOSIS — R0602 Shortness of breath: Secondary | ICD-10-CM

## 2019-12-16 DIAGNOSIS — M2550 Pain in unspecified joint: Secondary | ICD-10-CM

## 2020-04-10 DIAGNOSIS — I2699 Other pulmonary embolism without acute cor pulmonale: Secondary | ICD-10-CM

## 2020-04-10 HISTORY — DX: Other pulmonary embolism without acute cor pulmonale: I26.99

## 2020-04-17 ENCOUNTER — Emergency Department (HOSPITAL_COMMUNITY): Payer: BC Managed Care – PPO

## 2020-04-17 ENCOUNTER — Other Ambulatory Visit: Payer: Self-pay

## 2020-04-17 ENCOUNTER — Inpatient Hospital Stay (HOSPITAL_COMMUNITY)
Admission: EM | Admit: 2020-04-17 | Discharge: 2020-04-19 | DRG: 176 | Disposition: A | Discharge status: Home / Self Care | Payer: BC Managed Care – PPO | Attending: Internal Medicine | Admitting: Internal Medicine

## 2020-04-17 ENCOUNTER — Encounter (HOSPITAL_COMMUNITY): Payer: Self-pay | Admitting: Emergency Medicine

## 2020-04-17 DIAGNOSIS — I82441 Acute embolism and thrombosis of right tibial vein: Secondary | ICD-10-CM | POA: Diagnosis present

## 2020-04-17 DIAGNOSIS — J9811 Atelectasis: Secondary | ICD-10-CM | POA: Diagnosis present

## 2020-04-17 DIAGNOSIS — E1165 Type 2 diabetes mellitus with hyperglycemia: Secondary | ICD-10-CM | POA: Diagnosis present

## 2020-04-17 DIAGNOSIS — Z20822 Contact with and (suspected) exposure to covid-19: Secondary | ICD-10-CM | POA: Diagnosis present

## 2020-04-17 DIAGNOSIS — Z841 Family history of disorders of kidney and ureter: Secondary | ICD-10-CM | POA: Diagnosis not present

## 2020-04-17 DIAGNOSIS — I2693 Single subsegmental pulmonary embolism without acute cor pulmonale: Secondary | ICD-10-CM | POA: Diagnosis not present

## 2020-04-17 DIAGNOSIS — I2694 Multiple subsegmental pulmonary emboli without acute cor pulmonale: Secondary | ICD-10-CM | POA: Diagnosis not present

## 2020-04-17 DIAGNOSIS — E785 Hyperlipidemia, unspecified: Secondary | ICD-10-CM | POA: Diagnosis present

## 2020-04-17 DIAGNOSIS — Z79899 Other long term (current) drug therapy: Secondary | ICD-10-CM | POA: Diagnosis not present

## 2020-04-17 DIAGNOSIS — I82451 Acute embolism and thrombosis of right peroneal vein: Secondary | ICD-10-CM | POA: Diagnosis present

## 2020-04-17 DIAGNOSIS — E119 Type 2 diabetes mellitus without complications: Secondary | ICD-10-CM

## 2020-04-17 DIAGNOSIS — I10 Essential (primary) hypertension: Secondary | ICD-10-CM | POA: Diagnosis present

## 2020-04-17 DIAGNOSIS — Z7984 Long term (current) use of oral hypoglycemic drugs: Secondary | ICD-10-CM | POA: Diagnosis not present

## 2020-04-17 DIAGNOSIS — Z823 Family history of stroke: Secondary | ICD-10-CM | POA: Diagnosis not present

## 2020-04-17 DIAGNOSIS — D72829 Elevated white blood cell count, unspecified: Secondary | ICD-10-CM | POA: Diagnosis present

## 2020-04-17 DIAGNOSIS — Z8249 Family history of ischemic heart disease and other diseases of the circulatory system: Secondary | ICD-10-CM | POA: Diagnosis not present

## 2020-04-17 DIAGNOSIS — D72828 Other elevated white blood cell count: Secondary | ICD-10-CM | POA: Diagnosis present

## 2020-04-17 DIAGNOSIS — R0600 Dyspnea, unspecified: Secondary | ICD-10-CM

## 2020-04-17 DIAGNOSIS — I2699 Other pulmonary embolism without acute cor pulmonale: Secondary | ICD-10-CM | POA: Diagnosis present

## 2020-04-17 DIAGNOSIS — Z6841 Body Mass Index (BMI) 40.0 and over, adult: Secondary | ICD-10-CM | POA: Diagnosis not present

## 2020-04-17 DIAGNOSIS — I82431 Acute embolism and thrombosis of right popliteal vein: Secondary | ICD-10-CM | POA: Diagnosis present

## 2020-04-17 DIAGNOSIS — R739 Hyperglycemia, unspecified: Secondary | ICD-10-CM | POA: Diagnosis not present

## 2020-04-17 DIAGNOSIS — Z8616 Personal history of COVID-19: Secondary | ICD-10-CM | POA: Diagnosis not present

## 2020-04-17 DIAGNOSIS — Z880 Allergy status to penicillin: Secondary | ICD-10-CM | POA: Diagnosis not present

## 2020-04-17 DIAGNOSIS — I2609 Other pulmonary embolism with acute cor pulmonale: Secondary | ICD-10-CM

## 2020-04-17 LAB — COMPREHENSIVE METABOLIC PANEL
ALT: 56 U/L — ABNORMAL HIGH (ref 0–44)
AST: 49 U/L — ABNORMAL HIGH (ref 15–41)
Albumin: 3.3 g/dL — ABNORMAL LOW (ref 3.5–5.0)
Alkaline Phosphatase: 63 U/L (ref 38–126)
Anion gap: 12 (ref 5–15)
BUN: 17 mg/dL (ref 6–20)
CO2: 21 mmol/L — ABNORMAL LOW (ref 22–32)
Calcium: 9.3 mg/dL (ref 8.9–10.3)
Chloride: 104 mmol/L (ref 98–111)
Creatinine, Ser: 1.29 mg/dL — ABNORMAL HIGH (ref 0.61–1.24)
GFR, Estimated: >60 mL/min (ref >60)
Glucose, Bld: 242 mg/dL — ABNORMAL HIGH (ref 70–99)
Potassium: 4.2 mmol/L (ref 3.5–5.1)
Sodium: 137 mmol/L (ref 135–145)
Total Bilirubin: 0.8 mg/dL (ref 0.3–1.2)
Total Protein: 6.7 g/dL (ref 6.5–8.1)

## 2020-04-17 LAB — CBC WITH DIFFERENTIAL/PLATELET
Abs Immature Granulocytes: 0.11 10*3/uL — ABNORMAL HIGH (ref 0.00–0.07)
Basophils Absolute: 0.1 10*3/uL (ref 0.0–0.1)
Basophils Relative: 0 %
Eosinophils Absolute: 0 10*3/uL (ref 0.0–0.5)
Eosinophils Relative: 0 %
HCT: 47.4 % (ref 39.0–52.0)
Hemoglobin: 15.9 g/dL (ref 13.0–17.0)
Immature Granulocytes: 1 %
Lymphocytes Relative: 15 %
Lymphs Abs: 2.3 10*3/uL (ref 0.7–4.0)
MCH: 31.2 pg (ref 26.0–34.0)
MCHC: 33.5 g/dL (ref 30.0–36.0)
MCV: 92.9 fL (ref 80.0–100.0)
Monocytes Absolute: 0.7 10*3/uL (ref 0.1–1.0)
Monocytes Relative: 4 %
Neutro Abs: 12.4 10*3/uL — ABNORMAL HIGH (ref 1.7–7.7)
Neutrophils Relative %: 80 %
Platelets: 206 10*3/uL (ref 150–400)
RBC: 5.1 MIL/uL (ref 4.22–5.81)
RDW: 12.9 % (ref 11.5–15.5)
WBC: 15.6 10*3/uL — ABNORMAL HIGH (ref 4.0–10.5)
nRBC: 0 % (ref 0.0–0.2)

## 2020-04-17 LAB — GLUCOSE, CAPILLARY: Glucose-Capillary: 152 mg/dL — ABNORMAL HIGH (ref 70–99)

## 2020-04-17 LAB — BRAIN NATRIURETIC PEPTIDE: B Natriuretic Peptide: 84.4 pg/mL (ref 0.0–100.0)

## 2020-04-17 LAB — HEMOGLOBIN A1C
Hgb A1c MFr Bld: 7.2 % — ABNORMAL HIGH (ref 4.8–5.6)
Mean Plasma Glucose: 159.94 mg/dL

## 2020-04-17 LAB — TROPONIN I (HIGH SENSITIVITY)
Troponin I (High Sensitivity): 32 ng/L — ABNORMAL HIGH (ref <18)
Troponin I (High Sensitivity): 35 ng/L — ABNORMAL HIGH (ref <18)

## 2020-04-17 MED ORDER — IOHEXOL 350 MG/ML SOLN
75.0000 mL | Freq: Once | INTRAVENOUS | Status: AC | PRN
  Administered 2020-04-17: 75 mL via INTRAVENOUS

## 2020-04-17 MED ORDER — HEPARIN BOLUS VIA INFUSION
7500.0000 [IU] | Freq: Once | INTRAVENOUS | Status: AC
  Administered 2020-04-17: 7500 [IU] via INTRAVENOUS
  Filled 2020-04-17: qty 7500

## 2020-04-17 MED ORDER — ONDANSETRON HCL 4 MG PO TABS: 4.0000 mg | ORAL_TABLET | Freq: Four times a day (QID) | ORAL | Status: DC | PRN

## 2020-04-17 MED ORDER — INSULIN ASPART 100 UNIT/ML ~~LOC~~ SOLN: 0.0000 [IU] | Freq: Three times a day (TID) | SUBCUTANEOUS | Status: DC

## 2020-04-17 MED ORDER — MORPHINE SULFATE (PF) 2 MG/ML IV SOLN: 2.0000 mg | INTRAVENOUS | Status: DC | PRN

## 2020-04-17 MED ORDER — ENOXAPARIN SODIUM 300 MG/3ML IJ SOLN
1.0000 mg/kg | Freq: Once | INTRAMUSCULAR | Status: DC
Start: 2020-04-17 — End: 2020-04-17

## 2020-04-17 MED ORDER — ACETAMINOPHEN 325 MG PO TABS: 650.0000 mg | ORAL_TABLET | Freq: Four times a day (QID) | ORAL | Status: DC | PRN

## 2020-04-17 MED ORDER — TAMSULOSIN HCL 0.4 MG PO CAPS: 0.4000 mg | ORAL_CAPSULE | ORAL | Status: DC | PRN

## 2020-04-17 MED ORDER — INSULIN ASPART 100 UNIT/ML ~~LOC~~ SOLN: 0.0000 [IU] | Freq: Every day | SUBCUTANEOUS | Status: DC

## 2020-04-17 MED ORDER — SODIUM CHLORIDE 0.9 % IV SOLN: INTRAVENOUS | Status: DC

## 2020-04-17 MED ORDER — ACETAMINOPHEN 650 MG RE SUPP: 650.0000 mg | Freq: Four times a day (QID) | RECTAL | Status: DC | PRN

## 2020-04-17 MED ORDER — ONDANSETRON HCL 4 MG/2ML IJ SOLN: 4.0000 mg | Freq: Four times a day (QID) | INTRAMUSCULAR | Status: DC | PRN

## 2020-04-17 MED ORDER — HEPARIN (PORCINE) 25000 UT/250ML-% IV SOLN
2000.0000 [IU]/h | INTRAVENOUS | Status: DC
  Administered 2020-04-17 – 2020-04-18 (×3): 2000 [IU]/h via INTRAVENOUS
  Filled 2020-04-17 (×4): qty 250

## 2020-04-17 NOTE — ED Notes (Signed)
Received verbal report from [place] at this time

## 2020-04-17 NOTE — ED Triage Notes (Signed)
Pt sent over by MD office for a possible ct scan for sob some mild chest pain , pt had covid last nov

## 2020-04-17 NOTE — ED Notes (Signed)
Patient transported to CT 

## 2020-04-17 NOTE — ED Notes (Signed)
Report given to [place] RN on floor at this time

## 2020-04-17 NOTE — H&P (Signed)
History and Physical   Nicholas [name] TJQ:[id number] DOB: 19-Aug-1970 DOA: 04/17/2020  Referring MD/NP/PA: Dr. Lorre Munroe  PCP: Barnetta Chapel, NP   Outpatient Specialists: None  Patient coming from: Home  Chief Complaint: Chest pain or shortness of breath  HPI: Nicholas [name] is a 50 y.o. 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  Patient has remote history of DVT about 7 years ago.  At that time he claimed he was not treated with anticoagulation.  He has felt better and has been doing his work.  Then he had issue with his knee when he had a DVT.  He returned today to the ER because of this complaint.  Denies any leg pains or cramps.  Patient was evaluated.  He was found to have bilateral pulmonary embolus with right heart strain.  He did not feel any chest pain until now.  No family history of thromboembolic phenomena.  He is currently not requiring oxygen and the chest pain is better.  He has exertional dyspnea however.  Due to the findings patient is being admitted to the hospital for evaluation because of the high clot burden.  No recent surgery.  No other identified triggers..  ED Course: Temperature 99 blood pressure 150/78 pulse 130 respirate of 20 oxygen sat 94% room air.  White count 15.6 hemoglobin 15.9 platelets 206.  Chemistry showed a CO2 of 21 and creatinine 1.29 BUN 17.  Glucose 242.  Troponin 32.  Chest x-ray showed no acute findings.  CT angiogram of the chest shows nonocclusive thrombus seen within the right main pulmonary artery also bilateral segmental and subsegmental arteries branches there is a CT evidence of right heart strain with RV LV ratio 1.47 consistent with at least submassive PE possible pulmonary infarction at the right lung  base.Patient being admitted to hospital for further evaluation and treatment.  Review of Systems: As per HPI otherwise 10 point review of systems negative.    Past Medical History:  Diagnosis Date  . Cellulitis of right leg 09/17/2013  . Dyspnea     Past Surgical History:  Procedure Laterality Date  . CLAVICLE HARDWARE REMOVAL Left 2007  . EXTRACORPOREAL SHOCK WAVE LITHOTRIPSY Right 05/16/2019   Procedure: EXTRACORPOREAL SHOCK WAVE LITHOTRIPSY (ESWL);  Surgeon: [name], MD;  Location: [facility] ORS;  Service: Urology;  Laterality: Right;  . EXTRACORPOREAL SHOCK WAVE LITHOTRIPSY Right 07/18/2019   Procedure: EXTRACORPOREAL SHOCK WAVE LITHOTRIPSY (ESWL);  Surgeon: [name], MD;  Location: [facility] ORS;  Service: Urology;  Laterality: Right;  . EXTRACORPOREAL SHOCK WAVE LITHOTRIPSY Left 10/24/2019   Procedure: EXTRACORPOREAL SHOCK WAVE LITHOTRIPSY (ESWL);  Surgeon: [name], MD;  Location: [facility] ORS;  Service: Urology;  Laterality: Left;  . FRACTURE SURGERY Left 2007   "shattered clavicle; broke it in 9 places"     reports that he has never smoked. His smokeless tobacco use includes snuff. He reports that he does not drink alcohol and does not use drugs.  Allergies  Allergen Reactions  . Penicillins Other (See Comments)    Childhood reaction    Family History  Problem Relation Age of Onset  . Leukemia Mother   . Kidney disease Father   . Heart attack Maternal Grandfather   . Heart attack Paternal Grandfather   .  Stroke Other   . Heart disease Other   . Cancer Other      Prior to Admission medications   Medication Sig Start Date End Date Taking? Authorizing Provider  metFORMIN (GLUCOPHAGE) 500 MG tablet Take 1,000 mg by mouth 2 (two) times daily. 02/05/20  Yes [provider]  predniSONE (DELTASONE) 10 MG tablet Take 10-60 mg by mouth See admin instructions. Take 60mg  on day 1, 50mg  on day 2, 40mg  on day 3, 30mg  on day 4, 20mg  on day 5, and 10mg  on day 6.  04/14/20  Yes [provider]  RYBELSUS 7 MG TABS Take 7 mg by mouth daily. 04/16/20  Yes [provider]  tamsulosin (FLOMAX) 0.4 MG CAPS capsule Take 1 capsule (0.4 mg total) by mouth daily. Patient taking differently: Take 0.4 mg by mouth as needed (kidney stones). 05/16/19  Yes [name], MD  ibuprofen (ADVIL,MOTRIN) 400 MG tablet Take 1 tablet (400 mg total) by mouth every 6 (six) hours as needed for moderate pain or cramping. Patient not taking: No sig reported 09/21/13   Rai, Ripudeep K, MD  NUCYNTA 50 MG tablet Take 1 tablet (50 mg total) by mouth every 6 (six) hours as needed for moderate pain. 1 TO 2 TABS Q 6 HOURS PRN PAIN Patient not taking: No sig reported 05/16/19   [name], MD  NUCYNTA 50 MG tablet Take 1 tablet (50 mg total) by mouth every 6 (six) hours as needed for moderate pain. 1 TO 2 TABS Q 6 HOURS PRN PAIN Patient not taking: No sig reported 10/24/19   [name], MD  omeprazole (PRILOSEC) 20 MG capsule Take 1 capsule (20 mg total) by mouth daily. While you are taking ibuprofen Patient not taking: No sig reported 09/21/13   Rai, Ripudeep K, MD  ondansetron (ZOFRAN ODT) 8 MG disintegrating tablet Take 1 tablet (8 mg total) by mouth every 6 (six) hours as needed for nausea or vomiting. Patient not taking: No sig reported 05/16/19   [name], MD    Physical Exam: Vitals:   04/17/20 1703 04/17/20 1715 04/17/20 1800 04/17/20 1840  BP: (!) 150/78 101/84  103/67  Pulse: 100 (!) 105  88  Resp: 20 (!) 21  (!) 21  SpO2: 95% 97%  95%  Weight:   (!) 158.8 kg   Height:   6\' 2"  (1.88 m)       Constitutional: Morbidly obese, laying in bed, a little anxious, no distress Vitals:   04/17/20 1703 04/17/20 1715 04/17/20 1800 04/17/20 1840  BP: (!) 150/78 101/84  103/67  Pulse: 100 (!) 105  88  Resp: 20 (!) 21  (!) 21  SpO2: 95% 97%  95%  Weight:   (!) 158.8 kg   Height:   6\' 2"  (1.88 m)    Eyes: PERRL, lids and conjunctivae normal ENMT:  Mucous membranes are moist. Posterior pharynx clear of any exudate or lesions.Normal dentition.  Neck: normal, supple, no masses, no thyromegaly Respiratory: clear to auscultation bilaterally, no wheezing, no crackles. Normal respiratory effort. No accessory muscle use.  Cardiovascular: Sinus tachycardia, no murmurs / rubs / gallops. No extremity edema. 2+ pedal pulses. No carotid bruits.  Abdomen: no tenderness, no masses palpated. No hepatosplenomegaly. Bowel sounds positive.  Musculoskeletal: no clubbing / cyanosis. No joint deformity upper and lower extremities. Good ROM, no contractures. Normal muscle tone.  Skin: no rashes, lesions, ulcers. No induration Neurologic: CN 2-12 grossly intact. Sensation intact, DTR normal. Strength  5/5 in all 4.  Psychiatric: Normal judgment and insight. Alert and oriented x 3. Normal mood.     Labs on Admission: I have personally reviewed following labs and imaging studies  CBC: Recent Labs  Lab 04/17/20 1537  WBC 15.6*  NEUTROABS 12.4*  HGB 15.9  HCT 47.4  MCV 92.9  PLT 488   Basic Metabolic Panel: Recent Labs  Lab 04/17/20 1537  NA 137  K 4.2  CL 104  CO2 21*  GLUCOSE 242*  BUN 17  CREATININE 1.29*  CALCIUM 9.3   GFR: Estimated Creatinine Clearance: 112.9 mL/min (A) (by C-G formula based on SCr of 1.29 mg/dL (H)). Liver Function Tests: Recent Labs  Lab 04/17/20 1537  AST 49*  ALT 56*  ALKPHOS 63  BILITOT 0.8  PROT 6.7  ALBUMIN 3.3*   No results for input(s): LIPASE, AMYLASE in the last 168 hours. No results for input(s): AMMONIA in the last 168 hours. Coagulation Profile: No results for input(s): INR, PROTIME in the last 168 hours. Cardiac Enzymes: No results for input(s): CKTOTAL, CKMB, CKMBINDEX, TROPONINI in the last 168 hours. BNP (last 3 results) No results for input(s): PROBNP in the last 8760 hours. HbA1C: No results for input(s): HGBA1C in the last 72 hours. CBG: No results for input(s): GLUCAP in the last  168 hours. Lipid Profile: No results for input(s): CHOL, HDL, LDLCALC, TRIG, CHOLHDL, LDLDIRECT in the last 72 hours. Thyroid Function Tests: No results for input(s): TSH, T4TOTAL, FREET4, T3FREE, THYROIDAB in the last 72 hours. Anemia Panel: No results for input(s): VITAMINB12, FOLATE, FERRITIN, TIBC, IRON, RETICCTPCT in the last 72 hours. Urine analysis: No results found for: COLORURINE, APPEARANCEUR, LABSPEC, PHURINE, GLUCOSEU, HGBUR, BILIRUBINUR, KETONESUR, PROTEINUR, UROBILINOGEN, NITRITE, LEUKOCYTESUR Sepsis Labs: @LABRCNTIP (procalcitonin:4,lacticidven:4) )No results found for this or any previous visit (from the past 240 hour(s)).   Radiological Exams on Admission: CT Angio Chest PE W and/or Wo Contrast  Result Date: 04/17/2020 CLINICAL DATA:  Shortness of breath EXAM: CT ANGIOGRAPHY CHEST WITH CONTRAST TECHNIQUE: Multidetector CT imaging of the chest was performed using the standard protocol during bolus administration of intravenous contrast. Multiplanar CT image reconstructions and MIPs were obtained to evaluate the vascular anatomy. CONTRAST:  29mL OMNIPAQUE IOHEXOL 350 MG/ML SOLN COMPARISON:  None. FINDINGS: Cardiovascular: There is a optimal opacification of the pulmonary arteries. No central delayed filling defects are seen. There is a nonocclusive thrombus however seen within the right main pulmonary artery. Partially occlusive thrombus is seen within the right upper lobe, right middle lobe and right lower lobes segmental and subsegmental arterial branches. There is partially occlusive thrombus seen in the posterior left lower lobe and lingular segmental and subsegmental arterial branches. There is a trace pericardial effusion. There is evidence of right ventricular heart strain, RV/LV ratio 1.47. There is normal three-vessel brachiocephalic anatomy without proximal stenosis. The thoracic aorta is normal in appearance. Mediastinum/Nodes: No hilar, mediastinal, or axillary adenopathy.  Thyroid gland, trachea, and esophagus demonstrate no significant findings. Lungs/Pleura: Rounded patchy airspace opacities are seen at the posterior right lung base. There is also streaky airspace opacity seen at the left lung base. No large airspace consolidation is noted. No pleural effusion. Upper Abdomen: No acute abnormalities present in the visualized portions of the upper abdomen. Musculoskeletal: No chest wall abnormality. No acute or significant osseous findings. Anterior flowing osteophytes seen in the mid to lower thoracic spine. Review of the MIP images confirms the above findings. IMPRESSION: 1. Nonocclusive thrombus seen within the right main pulmonary artery. There  is partially/nearly occlusive thrombus seen within the bilateral segmental and subsegmental arterial branches as described above. CTevidence of right heart strain (RV/LV Ratio = 1.47) consistent with at least submassive (intermediate risk) PE. The presence of right heart strain has been associated with an increased risk of morbidity and mortality. 2. Rounded patchy airspace opacity at the right lung base which may be due to atelectasis or mild pulmonary infarction 3. These results were called by telephone at the time of interpretation on 04/17/2020 at 6:08 pm to provider Dr. Joya Gaskins, who verbally acknowledged these results. Electronically Signed   By: Prudencio Pair M.D.   On: 04/17/2020 18:10   DG Chest Port 1 View  Result Date: 04/17/2020 CLINICAL DATA:  Shortness of breath.  Chest pain. EXAM: PORTABLE CHEST 1 VIEW COMPARISON:  12/16/2019 FINDINGS: Previous heterogeneous bilateral airspace disease has resolved. There is minimal residual subsegmental opacity at the right greater than left lung base suggestive of scarring. No new or progressive consolidation. Low lung volumes with bronchovascular crowding. Upper normal heart size, stable. No pleural effusion or pneumothorax. No acute osseous abnormalities are seen IMPRESSION: 1. Low lung  volumes with bronchovascular crowding. 2. Previous bilateral COVID pneumonia has resolved with minimal residual subsegmental opacity at the bases, likely scarring. Electronically Signed   By: Keith Rake M.D.   On: 04/17/2020 15:24    EKG: Independently reviewed.  Shows sinus tachycardia with a rate of 113.  Low voltage throughout.  No significant ST changes  Assessment/Plan Principal Problem:   Pulmonary embolism (HCC) Active Problems:   Morbid obesity ([place])   Hyperglycemia   Leucocytosis   Diabetes ([place])     #1 submassive bilateral pulmonary embolism: Patient will be initiated on IV heparin drip.  We will get echocardiogram.  Oxygen as needed.  Bedrest for now.  If he remains stable from tomorrow we will transition to oral anticoagulants possibly Xarelto or Eliquis.  Counseling provided.  Patient may require lifelong treatment since he is having a second episode.  #2 diabetes: Only on Metformin.  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  Sliding scale insulin while in the hospital and hopefully he can resume oral therapy when he leaves.  It appears uncontrolled.  May check hemoglobin A1c.  #3 hypertension: Does not appear to be on any medicines at home.  Monitor blood pressure and treat if continues to rise.  #4 morbid obesity: Dietary counseling   DVT prophylaxis: Heparin drip Code Status: Full code Family Communication: No family at bedside Disposition Plan: Home Consults called: None Admission status: Inpatient  Severity of Illness: The appropriate patient status for this patient is INPATIENT. Inpatient status is judged to be reasonable and necessary in order to provide the required intensity of service to ensure the patient's safety. [name] presenting symptoms, physical exam findings, and initial radiographic and laboratory data in the context of their chronic comorbidities is felt to place them at high risk for further clinical deterioration.  Furthermore, it is not anticipated that the patient will be medically stable for discharge from the hospital within 2 midnights of admission. The following factors support the patient status of inpatient.   " [name] presenting symptoms include shortness of breath and chest pain. " The worrisome physical exam findings include morbidly obese with some crackles. " The initial radiographic and laboratory data are worrisome because of CT angiogram of the chest showing PE. " The chronic co-morbidities include diabetes.   * I certify that at the point of  admission it is my clinical judgment that the patient will require inpatient hospital care spanning beyond 2 midnights from the point of admission due to high intensity of service, high risk for further deterioration and high frequency of surveillance required.Barbette Merino MD [facility] Pager [phone]  If 7PM-7AM, please contact night-coverage [email or web address] Password [facility]  04/17/2020, 6:43 PM

## 2020-04-17 NOTE — ED Notes (Signed)
covid test obtained and sent to the lab

## 2020-04-17 NOTE — ED Notes (Signed)
This RN attempted report to [facility], nobody answered the phone.

## 2020-04-17 NOTE — ED Notes (Signed)
Received verbal report from [place] at this time

## 2020-04-17 NOTE — ED Provider Notes (Signed)
[facility] EMERGENCY DEPARTMENT Provider Note   CSN: [id number] Arrival date & time: 04/17/20  1358     History No chief complaint on file.   Nicholas [name] is a 50 y.o. male.  HPI Patient presents with shortness of breath.  Sent in by PCP.  Few days ago had acute onset of shortness of breath.  Some pain in his upper chest.  Had a noncontrast chest CT done at that time.  Showed some bronchiectasis and chronic changes after his COVID.  Had Covid back in November.  States he been doing better before this episode but had acutely gotten worse.  Reviewing notes they were going to order D-dimer but appears that they ordered CT scan without contrast instead.  It did not show any pulmonary artery dilatation but was not a CT angiography.  Has some shortness of breath that is not improved with the steroids he was given.  States his energy has improved but his shortness of breath is not.  States he is just comfortable and cannot do the things he used to be able to do.    Past Medical History:  Diagnosis Date  . Cellulitis of right leg 09/17/2013  . Dyspnea     Patient Active Problem List   Diagnosis Date Noted  . Cellulitis of right leg 09/17/2013  . Thrombophlebitis 09/17/2013  . Cellulitis 09/17/2013  . Morbid obesity ([place]) 06/20/2013  . Dyspnea 06/19/2013    Past Surgical History:  Procedure Laterality Date  . CLAVICLE HARDWARE REMOVAL Left 2007  . EXTRACORPOREAL SHOCK WAVE LITHOTRIPSY Right 05/16/2019   Procedure: EXTRACORPOREAL SHOCK WAVE LITHOTRIPSY (ESWL);  Surgeon: [name], MD;  Location: [facility] ORS;  Service: Urology;  Laterality: Right;  . EXTRACORPOREAL SHOCK WAVE LITHOTRIPSY Right 07/18/2019   Procedure: EXTRACORPOREAL SHOCK WAVE LITHOTRIPSY (ESWL);  Surgeon: [name], MD;  Location: [facility] ORS;  Service: Urology;  Laterality: Right;  . EXTRACORPOREAL SHOCK WAVE LITHOTRIPSY Left 10/24/2019   Procedure: EXTRACORPOREAL SHOCK WAVE LITHOTRIPSY (ESWL);   Surgeon: [name], MD;  Location: [facility] ORS;  Service: Urology;  Laterality: Left;  . FRACTURE SURGERY Left 2007   "shattered clavicle; broke it in 9 places"       Family History  Problem Relation Age of Onset  . Leukemia Mother   . Kidney disease Father   . Heart attack Maternal Grandfather   . Heart attack Paternal Grandfather   . Stroke Other   . Heart disease Other   . Cancer Other     Social History   Tobacco Use  . Smoking status: Never Smoker  . Smokeless tobacco: Current User    Types: Snuff  Substance Use Topics  . Alcohol use: No  . Drug use: No    Home Medications Prior to Admission medications   Medication Sig Start Date End Date Taking? Authorizing Provider  ciprofloxacin (CIPRO) 500 MG tablet Take 1 tablet (500 mg total) by mouth 2 (two) times daily. Patient not taking: Reported on 10/24/2019 07/18/19   [name], MD  ciprofloxacin (CIPRO) 500 MG tablet Take 1 tablet (500 mg total) by mouth 2 (two) times daily. 10/24/19   [name], MD  doxycycline (VIBRAMYCIN) 100 MG capsule Take 1 capsule (100 mg total) by mouth 2 (two) times daily. X 2WEEKS Patient not taking: Reported on 10/24/2019 09/21/13   Rai, Vernelle Emerald, MD  ibuprofen (ADVIL,MOTRIN) 400 MG tablet Take 1 tablet (400 mg total) by mouth every 6 (six) hours as  needed for moderate pain or cramping. 09/21/13   Rai, Ripudeep K, MD  NUCYNTA 50 MG tablet Take 1 tablet (50 mg total) by mouth every 6 (six) hours as needed for moderate pain. 1 TO 2 TABS Q 6 HOURS PRN PAIN Patient not taking: Reported on 10/24/2019 05/16/19   [name], MD  NUCYNTA 50 MG tablet Take 1 tablet (50 mg total) by mouth every 6 (six) hours as needed for moderate pain. 1 TO 2 TABS Q 6 HOURS PRN PAIN 10/24/19   [name], MD  omeprazole (PRILOSEC) 20 MG capsule Take 1 capsule (20 mg total) by mouth daily. While you are taking ibuprofen Patient not taking: Reported on 10/24/2019 09/21/13   Rai, Vernelle Emerald, MD   ondansetron (ZOFRAN ODT) 8 MG disintegrating tablet Take 1 tablet (8 mg total) by mouth every 6 (six) hours as needed for nausea or vomiting. Patient not taking: Reported on 10/24/2019 05/16/19   [name], MD  tamsulosin (FLOMAX) 0.4 MG CAPS capsule Take 1 capsule (0.4 mg total) by mouth daily. 05/16/19   [name], MD    Allergies    Penicillins  Review of Systems   Review of Systems  Constitutional: Negative for appetite change.  HENT: Negative for dental problem.   Respiratory: Positive for cough and shortness of breath.   Cardiovascular: Positive for chest pain.  Gastrointestinal: Negative for abdominal pain.  Genitourinary: Negative for flank pain.  Musculoskeletal: Negative for back pain.  Skin: Negative for rash.  Neurological: Negative for weakness.  Psychiatric/Behavioral: Negative for behavioral problems.    Physical Exam Updated Vital Signs BP 114/86   Pulse (!) 111   Resp 19   SpO2 96%   Physical Exam Vitals and nursing note reviewed.  HENT:     Head: Atraumatic.  Eyes:     Pupils: Pupils are equal, round, and reactive to light.  Cardiovascular:     Rate and Rhythm: Tachycardia present.  Pulmonary:     Breath sounds: No wheezing or rhonchi.  Abdominal:     Tenderness: There is no abdominal tenderness.  Musculoskeletal:        General: No tenderness.  Skin:    General: Skin is warm.     Capillary Refill: Capillary refill takes less than 2 seconds.  Neurological:     Mental Status: He is alert and oriented to person, place, and time.     ED Results / Procedures / Treatments   Labs (all labs ordered are listed, but only abnormal results are displayed) Labs Reviewed  CBC WITH DIFFERENTIAL/PLATELET - Abnormal; Notable for the following components:      Result Value   WBC 15.6 (*)    Neutro Abs 12.4 (*)    Abs Immature Granulocytes 0.11 (*)    All other components within normal limits  COMPREHENSIVE METABOLIC PANEL  BRAIN NATRIURETIC  PEPTIDE  TROPONIN I (HIGH SENSITIVITY)  TROPONIN I (HIGH SENSITIVITY)    EKG EKG Interpretation  Date/Time:  Friday April 17 2020 14:36:06 EDT Ventricular Rate:  113 PR Interval:  174 QRS Duration: 74 QT Interval:  314 QTC Calculation: 430 R Axis:   -70 Text Interpretation: Sinus tachycardia Left axis deviation Low voltage QRS Inferior infarct , age undetermined Cannot rule out Anterior infarct , age undetermined Abnormal ECG Confirmed by Davonna Belling [phone]) on 04/17/2020 3:07:34 PM   Radiology DG Chest Port 1 View  Result Date: 04/17/2020 CLINICAL DATA:  Shortness of breath.  Chest pain. EXAM: PORTABLE  CHEST 1 VIEW COMPARISON:  12/16/2019 FINDINGS: Previous heterogeneous bilateral airspace disease has resolved. There is minimal residual subsegmental opacity at the right greater than left lung base suggestive of scarring. No new or progressive consolidation. Low lung volumes with bronchovascular crowding. Upper normal heart size, stable. No pleural effusion or pneumothorax. No acute osseous abnormalities are seen IMPRESSION: 1. Low lung volumes with bronchovascular crowding. 2. Previous bilateral COVID pneumonia has resolved with minimal residual subsegmental opacity at the bases, likely scarring. Electronically Signed   By: Keith Rake M.D.   On: 04/17/2020 15:24    Procedures Procedures   Medications Ordered in ED Medications - No data to display  ED Course  I have reviewed the triage vital signs and the nursing notes.  Pertinent labs & imaging results that were available during my care of the patient were reviewed by me and considered in my medical decision making (see chart for details).    MDM Rules/Calculators/A&P                          Patient with shortness of breath.  Acute onset a few days ago.  Had noncontrast CT done that showed likely chronic changes from Covid previously.  However it was not a CT pulmonary artery.  Think patient benefit now to rule out  PE.  Care was turned over to Dr. Joya Gaskins. Final Clinical Impression(s) / ED Diagnoses Final diagnoses:  Dyspnea, unspecified type    Rx / DC Orders ED Discharge Orders    None       Davonna Belling, MD 04/17/20 [phone]

## 2020-04-17 NOTE — Progress Notes (Signed)
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 Dr Jonelle Sidle paged. MD called back and says patient will need to be on insulin for now because he received contrast dye during CT scan and this can cause renal failure if he take metformin. I explained this to the patient and he verbalizes understating but he is insisting he will not take insulin upon discharge.

## 2020-04-17 NOTE — ED Triage Notes (Signed)
Emergency Medicine Provider Triage Evaluation Note  Nicholas [name] , a 50 y.o. male  was evaluated in triage.  Pt complains of shortness of breath.  Started on Tuesday, felt like pulled a muscle in his chest on Tuesday and has been having shortness of breath since than. No change in shortness of breath since than.  Sent here by primary care doctor for abnormal EKG.     Has been on prednisone since Tuesday night.    Repoerrts had CT chest done on this Tuesday.    Review of Systems  Positive: Shortness of breath Negative: Fever, chills, cough, leg swelling, hemoptysis, chest pain  Physical Exam  BP 121/81   Pulse (!) 109   Resp 12   SpO2 98%  Gen:   Awake, no distress   HEENT:  Atraumatic  Resp:  Normal effort, lungs clear to auscultation, able to speak in full complete sentences  MSK:   Moves extremities without difficulty  Neuro:  Speech clear   Medical Decision Making  Medically screening exam initiated at 2:28 PM.  Appropriate orders placed.  Nicholas [name] was informed that the remainder of the evaluation will be completed by another provider, this initial triage assessment does not replace that evaluation, and the importance of remaining in the ED until their evaluation is complete.  Clinical Impression   Shortness of breath x4 days; able to speak in full complete sentences without any difficulty   The patient appears stable so that the remainder of the work up may be completed by another provider.     Nicholas [name], [place] 04/17/20 1457

## 2020-04-17 NOTE — Progress Notes (Signed)
ANTICOAGULATION CONSULT NOTE - Initial Consult  Pharmacy Consult for heparin dosing Indication: pulmonary embolus  Allergies  Allergen Reactions  . Penicillins Other (See Comments)    Childhood reaction    Patient Measurements: Height: 6\' 2"  (188 cm) Weight: (!) 158.8 kg (350 lb) IBW/kg (Calculated) : 82.2 Heparin Dosing Weight: 119.6 KG  Vital Signs: BP: 103/67 (04/08 1840) Pulse Rate: 88 (04/08 1840)  Labs: Recent Labs    04/17/20 1537 04/17/20 1636  HGB 15.9  --   HCT 47.4  --   PLT 206  --   CREATININE 1.29*  --   TROPONINIHS 32* 35*    Estimated Creatinine Clearance: 112.9 mL/min (A) (by C-G formula based on SCr of 1.29 mg/dL (H)).   Medical History: Past Medical History:  Diagnosis Date  . Cellulitis of right leg 09/17/2013  . Dyspnea     Assessment: 50 y.o. male presenting 4/8 with SOB for multiple days with some chest pain. Reports having had COVID in November. Had a noncontrast CT done a few days ago which showed changes consistent with prior COVID infection. It was not able to rule out PE at that time. CTa Chest in ED consistent with submassive PE. Pharmacy has been consulted to start IV heparin.   Goal of Therapy:  Heparin level 0.3-0.7 units/ml Monitor platelets by anticoagulation protocol: Yes   Plan:  Give 7500 units bolus x 1 Start heparin infusion at 2000 units/hr Check anti-Xa level in 6 hours and daily while on heparin Continue to monitor H&H and platelets  Claudina Lick, PharmD PGY1 [place] Resident 04/17/2020 6:54 PM  Please check [email or web address] for unit-specific pharmacy phone numbers.

## 2020-04-17 NOTE — ED Provider Notes (Signed)
  Physical Exam  BP 103/67   Pulse 88   Resp (!) 21   Ht 6\' 2"  (1.88 m)   Wt (!) 158.8 kg   SpO2 95%   BMI 44.94 kg/m   Physical Exam  ED Course/Procedures     Procedures  MDM  Nicholas [name]: Bilateral PEs with right heart strain.  As a result, I do think he needs to be admitted to the hospital.  He was given a dose of Lovenox.  I spoke with Dr. Jonelle Sidle of the hospitalist service.       Arnaldo Natal, MD 04/17/20 [phone]

## 2020-04-17 NOTE — ED Notes (Signed)
Attempted to call report advised nurse was busy to call back

## 2020-04-18 ENCOUNTER — Other Ambulatory Visit: Payer: Self-pay

## 2020-04-18 ENCOUNTER — Inpatient Hospital Stay (HOSPITAL_COMMUNITY): Payer: BC Managed Care – PPO

## 2020-04-18 DIAGNOSIS — I2699 Other pulmonary embolism without acute cor pulmonale: Secondary | ICD-10-CM

## 2020-04-18 DIAGNOSIS — I2693 Single subsegmental pulmonary embolism without acute cor pulmonale: Secondary | ICD-10-CM | POA: Diagnosis not present

## 2020-04-18 LAB — HEPARIN LEVEL (UNFRACTIONATED)
Heparin Unfractionated: 0.47 IU/mL (ref 0.30–0.70)
Heparin Unfractionated: 0.47 IU/mL (ref 0.30–0.70)

## 2020-04-18 LAB — GLUCOSE, CAPILLARY
Glucose-Capillary: 108 mg/dL — ABNORMAL HIGH (ref 70–99)
Glucose-Capillary: 117 mg/dL — ABNORMAL HIGH (ref 70–99)
Glucose-Capillary: 146 mg/dL — ABNORMAL HIGH (ref 70–99)
Glucose-Capillary: 117 mg/dL — ABNORMAL HIGH (ref 70–99)

## 2020-04-18 LAB — ECHOCARDIOGRAM COMPLETE
Area-P 1/2: 3.6 cm2
Height: 73 in
S' Lateral: 2.95 cm
Weight: 5590.4 oz

## 2020-04-18 LAB — HIV ANTIBODY (ROUTINE TESTING W REFLEX): HIV Screen 4th Generation wRfx: NONREACTIVE

## 2020-04-18 LAB — COMPREHENSIVE METABOLIC PANEL
ALT: 45 U/L — ABNORMAL HIGH (ref 0–44)
AST: 32 U/L (ref 15–41)
Albumin: 2.8 g/dL — ABNORMAL LOW (ref 3.5–5.0)
Alkaline Phosphatase: 57 U/L (ref 38–126)
Anion gap: 6 (ref 5–15)
BUN: 16 mg/dL (ref 6–20)
CO2: 24 mmol/L (ref 22–32)
Calcium: 8.8 mg/dL — ABNORMAL LOW (ref 8.9–10.3)
Chloride: 109 mmol/L (ref 98–111)
Creatinine, Ser: 0.96 mg/dL (ref 0.61–1.24)
GFR, Estimated: >60 mL/min (ref >60)
Glucose, Bld: 149 mg/dL — ABNORMAL HIGH (ref 70–99)
Potassium: 3.8 mmol/L (ref 3.5–5.1)
Sodium: 139 mmol/L (ref 135–145)
Total Bilirubin: 1 mg/dL (ref 0.3–1.2)
Total Protein: 5.9 g/dL — ABNORMAL LOW (ref 6.5–8.1)

## 2020-04-18 LAB — CBC
HCT: 41.4 % (ref 39.0–52.0)
Hemoglobin: 14.1 g/dL (ref 13.0–17.0)
MCH: 31.4 pg (ref 26.0–34.0)
MCHC: 34.1 g/dL (ref 30.0–36.0)
MCV: 92.2 fL (ref 80.0–100.0)
Platelets: 196 10*3/uL (ref 150–400)
RBC: 4.49 MIL/uL (ref 4.22–5.81)
RDW: 13 % (ref 11.5–15.5)
WBC: 17.1 10*3/uL — ABNORMAL HIGH (ref 4.0–10.5)
nRBC: 0 % (ref 0.0–0.2)

## 2020-04-18 LAB — SARS CORONAVIRUS 2 (TAT 6-24 HRS): SARS Coronavirus 2: NEGATIVE

## 2020-04-18 MED ORDER — SEMAGLUTIDE 7 MG PO TABS
7.0000 mg | ORAL_TABLET | Freq: Every day | ORAL | Status: DC
  Administered 2020-04-19: 7 mg via ORAL
  Filled 2020-04-18: qty 1

## 2020-04-18 MED ORDER — SEMAGLUTIDE 7 MG PO TABS: 7.0000 mg | ORAL_TABLET | Freq: Every day | ORAL | Status: DC

## 2020-04-18 MED ORDER — PANTOPRAZOLE SODIUM 40 MG PO TBEC
40.0000 mg | DELAYED_RELEASE_TABLET | Freq: Every day | ORAL | Status: DC
  Administered 2020-04-18: 40 mg via ORAL
  Filled 2020-04-18: qty 1

## 2020-04-18 MED ORDER — METFORMIN HCL 500 MG PO TABS
1000.0000 mg | ORAL_TABLET | Freq: Two times a day (BID) | ORAL | Status: DC
  Filled 2020-04-18: qty 2

## 2020-04-18 MED ORDER — PERFLUTREN LIPID MICROSPHERE
1.0000 mL | INTRAVENOUS | Status: AC | PRN
  Administered 2020-04-18: 2 mL via INTRAVENOUS
  Filled 2020-04-18: qty 10

## 2020-04-18 NOTE — Progress Notes (Signed)
PROGRESS NOTE    Nicholas [name] [facility]  TIW:[id number] DOB: [birth year] DOA: 04/17/2020 PCP: Barnetta Chapel, NP    Brief Narrative:  Mr. Cerro was admitted to the hospital with a working diagnosis of submassive bilateral pulmonary embolism.  50 year old male past medical history for obesity class III, type 2 diabetes mellitus, dyslipidemia, DVT, who had a COVID-19 viral infection October 2021 who presented with chest pain and dyspnea.  Patient developed a sudden onset of chest pain, pressure in nature, associated with dyspnea.  Due to persistent symptoms he came to the hospital for further evaluation.  On his initial physical examination blood pressure 150/78, heart rate 100, respiratory rate 21, oxygen saturation 95% on supplemental oxygen.  His lungs were clear to auscultation bilaterally, heart S1-S2, present, rhythmic, tachycardic, abdomen soft nontender, no lower extremity edema.  Sodium 137, potassium 4.2, chloride 104, bicarb 21, glucose 242, BUN 17, creatinine 1.29, troponin I 32-35, white count 15.6, hemoglobin 15.9, hematocrit 47.4, platelets 206. SARS COVID-19 negative.  Chest radiograph with bilateral lower lobe atelectasis. CT chest with nonocclusive thrombus within the right main pulmonary artery.  Partially/nearly occlusive thrombus within the bilateral segmental and subsegmental arterial branches.  CT evidence of right heart strain.  Right base atelectasis.  EKG 113 bpm, left axis deviation, normal intervals, sinus rhythm, poor R wave progression, no ST segment changes, negative T waves V1-V3.  Assessment & Plan:   Principal Problem:   Pulmonary embolism (HCC) Active Problems:   Morbid obesity ([place])   Hyperglycemia   Leucocytosis   Diabetes ([place])   1. Acute bilateral submassive pulmonary embolism. Patient with no chest pain or dyspnea, he has been tachycardic.  Blood pressure this am 112/70 and HR 82 to 94.  Continue anticoagulation with IV heparin, follow up with  echocardiogram and US doppler lower extremities. If patient continue hemodynamically stable and echocardiogram with no RV dysfunction, will plan to transition to oral anticoagulation.   Continue with telemetry and oxymetry monitoring.  Discontinue IV fluids.   2. HTN. Continue blood pressure monitoring, holding BP meds for now.   3. T2DM. Continue glucose cover and monitoring with insulin sliding scale. Will resume metformin and semaglutide  4. Obesity class 3. Calculated BMI is 46.1  5. Reactive leukocytosis. Patient just completed a steroid taper with oral prednisone.   Patient continue to be at high risk for worsening pulmonary embolism.   Status is: Inpatient  Remains inpatient appropriate because:IV treatments appropriate due to intensity of illness or inability to take PO   Dispo: The patient is from: Home              Anticipated d/c is to: Home              Patient currently is not medically stable to d/c.   Difficult to place patient No  DVT prophylaxis: IV heparin   Code Status:   full  Family Communication:  No family at the bedside       Subjective: Patient is feeling well, no chest pain or dyspnea, no nausea or vomiting, he has been tachycardic not at his baseline.   Objective: Vitals:   04/17/20 2100 04/18/20 0015 04/18/20 0159 04/18/20 0519  BP: 138/89 115/73 112/70 114/84  Pulse: 81 77 77 70  Resp: 18 18 18 18   Temp: 97.7 F (36.5 C) 98 F (36.7 C) 98 F (36.7 C) (!) 97.5 F (36.4 C)  TempSrc: Oral Oral Oral Oral  SpO2: 96% 96% 95% 95%  Weight:  Height:        Intake/Output Summary (Last 24 hours) at 04/18/2020 0850 Last data filed at 04/17/2020 2202 Gross per 24 hour  Intake 240 ml  Output --  Net 240 ml   Filed Weights   04/17/20 1800 04/17/20 1936 04/17/20 2057  Weight: (!) 158.8 kg (!) 159.2 kg (!) 158.5 kg    Examination:   General: Not in pain or dyspnea.  Neurology: Awake and alert, non focal  E ENT: no pallor, no icterus,  oral mucosa moist Cardiovascular: No JVD. S1-S2 present, rhythmic, no gallops, rubs, or murmurs. Non pitting bilateral lower extremity edema. Pulmonary: positive breath sounds bilaterally, Gastrointestinal. Abdomen soft and non tender Skin. No rashes Musculoskeletal: no joint deformities     Data Reviewed: I have personally reviewed following labs and imaging studies  CBC: Recent Labs  Lab 04/17/20 1537 04/18/20 0310  WBC 15.6* 17.1*  NEUTROABS 12.4*  --   HGB 15.9 14.1  HCT 47.4 41.4  MCV 92.9 92.2  PLT 206 824   Basic Metabolic Panel: Recent Labs  Lab 04/17/20 1537 04/18/20 0310  NA 137 139  K 4.2 3.8  CL 104 109  CO2 21* 24  GLUCOSE 242* 149*  BUN 17 16  CREATININE 1.29* 0.96  CALCIUM 9.3 8.8*   GFR: Estimated Creatinine Clearance: 149.8 mL/min (by C-G formula based on SCr of 0.96 mg/dL). Liver Function Tests: Recent Labs  Lab 04/17/20 1537 04/18/20 0310  AST 49* 32  ALT 56* 45*  ALKPHOS 63 57  BILITOT 0.8 1.0  PROT 6.7 5.9*  ALBUMIN 3.3* 2.8*   No results for input(s): LIPASE, AMYLASE in the last 168 hours. No results for input(s): AMMONIA in the last 168 hours. Coagulation Profile: No results for input(s): INR, PROTIME in the last 168 hours. Cardiac Enzymes: No results for input(s): CKTOTAL, CKMB, CKMBINDEX, TROPONINI in the last 168 hours. BNP (last 3 results) No results for input(s): PROBNP in the last 8760 hours. HbA1C: Recent Labs    04/17/20 2203  HGBA1C 7.2*   CBG: Recent Labs  Lab 04/17/20 2116 04/18/20 0616  GLUCAP 152* 108*   Lipid Profile: No results for input(s): CHOL, HDL, LDLCALC, TRIG, CHOLHDL, LDLDIRECT in the last 72 hours. Thyroid Function Tests: No results for input(s): TSH, T4TOTAL, FREET4, T3FREE, THYROIDAB in the last 72 hours. Anemia Panel: No results for input(s): VITAMINB12, FOLATE, FERRITIN, TIBC, IRON, RETICCTPCT in the last 72 hours.    Radiology Studies: I have reviewed all of the imaging during this  hospital visit personally     Scheduled Meds: . insulin aspart  0-20 Units Subcutaneous TID WC  . insulin aspart  0-5 Units Subcutaneous QHS   Continuous Infusions: . sodium chloride 75 mL/hr at 04/17/20 2105  . heparin 2,000 Units/hr (04/18/20 0517)     LOS: 1 day        Ellarie Picking Gerome Apley, MD

## 2020-04-18 NOTE — Progress Notes (Signed)
  Echocardiogram 2D Echocardiogram has been performed.  Nicholas [name] 04/18/2020, 12:57 PM

## 2020-04-18 NOTE — Progress Notes (Signed)
Patient heart rate 150 non sustaining, denies any pain at this time. Upon entering his room he was found in bed resting comfortably. Dr Tonie Griffith text paged  04/18/20 0159  Vitals  Temp 35 F (36.7 C)  Temp Source Oral  BP 112/70  MAP (mmHg) 82  BP Location Left Arm  BP Method Automatic  Patient Position (if appropriate) Lying  Pulse Rate 77  Pulse Rate Source Dinamap  Resp 18  Oxygen Therapy  SpO2 95 %  O2 Device Room Air

## 2020-04-18 NOTE — Progress Notes (Signed)
[place] for heparin  Indication: pulmonary embolus  Allergies  Allergen Reactions  . Penicillins Other (See Comments)    Childhood reaction    Patient Measurements: Height: 6\' 1"  (185.4 cm) Weight: (!) 158.5 kg (349 lb 6.4 oz) IBW/kg (Calculated) : 79.9 Heparin Dosing Weight: 119.6 KG  Vital Signs: Temp: 97.5 F (36.4 C) (04/09 0519) Temp Source: Oral (04/09 0519) BP: 114/84 (04/09 0519) Pulse Rate: 70 (04/09 0519)  Labs: Recent Labs    04/17/20 1537 04/17/20 1636 04/18/20 0310 04/18/20 1003  HGB 15.9  --  14.1  --   HCT 47.4  --  41.4  --   PLT 206  --  196  --   HEPARINUNFRC  --   --  0.47 0.47  CREATININE 1.29*  --  0.96  --   TROPONINIHS 32* 35*  --   --     Estimated Creatinine Clearance: 149.8 mL/min (by C-G formula based on SCr of 0.96 mg/dL).  Assessment: 50 y.o. male presenting 4/8 with SOB for multiple days with some chest pain. Reports having had COVID in November. Had a noncontrast CT done a few days ago which showed changes consistent with prior COVID infection. It was not able to rule out PE at that time. CTa Chest in ED consistent with submassive PE. Pharmacy has been consulted to start IV heparin.   Repeat HL remains therapeutic at 0.47.   Goal of Therapy:  Heparin level 0.3-0.7 units/ml Monitor platelets by anticoagulation protocol: Yes   Plan:  Continue heparin infusion at 2000 units/hr - Check anti-Xa level daily while on heparin - Monitor H&H and platelets, and s/sx of bleeding -F/u transition to PO AC   Claudina Lick, PharmD PGY1 [place] Resident 04/18/2020 10:56 AM  Please check [email or web address] for unit-specific pharmacy phone numbers.

## 2020-04-18 NOTE — Progress Notes (Signed)
VASCULAR LAB    Bilateral lower extremity venous duplex has been performed.  See CV proc for preliminary results.  Messaged results to Dr. Cathlean Sauer via secure chat.  Aasia Peavler, RVT 04/18/2020, 10:06 AM

## 2020-04-18 NOTE — Progress Notes (Signed)
[place] for heparin  Indication: pulmonary embolus  Allergies  Allergen Reactions  . Penicillins Other (See Comments)    Childhood reaction    Patient Measurements: Height: 6\' 1"  (185.4 cm) Weight: (!) 158.5 kg (349 lb 6.4 oz) IBW/kg (Calculated) : 79.9 Heparin Dosing Weight: 119.6 KG  Vital Signs: Temp: 98 F (36.7 C) (04/09 0159) Temp Source: Oral (04/09 0159) BP: 112/70 (04/09 0159) Pulse Rate: 77 (04/09 0159)  Labs: Recent Labs    04/17/20 1537 04/17/20 1636 04/18/20 0310  HGB 15.9  --   --   HCT 47.4  --   --   PLT 206  --   --   HEPARINUNFRC  --   --  0.47  CREATININE 1.29*  --   --   TROPONINIHS 32* 35*  --     Estimated Creatinine Clearance: 111.4 mL/min (A) (by C-G formula based on SCr of 1.29 mg/dL (H)).  Assessment: 50 y.o. male with PE for heparin  Goal of Therapy:  Heparin level 0.3-0.7 units/ml Monitor platelets by anticoagulation protocol: Yes   Plan:  Continue Heparin at current rate  Phillis Knack, PharmD, BCPS

## 2020-04-19 DIAGNOSIS — R739 Hyperglycemia, unspecified: Secondary | ICD-10-CM

## 2020-04-19 LAB — CBC WITH DIFFERENTIAL/PLATELET
Abs Immature Granulocytes: 0.18 10*3/uL — ABNORMAL HIGH (ref 0.00–0.07)
Basophils Absolute: 0.1 10*3/uL (ref 0.0–0.1)
Basophils Relative: 0 %
Eosinophils Absolute: 0.2 10*3/uL (ref 0.0–0.5)
Eosinophils Relative: 1 %
HCT: 44.2 % (ref 39.0–52.0)
Hemoglobin: 15 g/dL (ref 13.0–17.0)
Immature Granulocytes: 1 %
Lymphocytes Relative: 34 %
Lymphs Abs: 4.9 10*3/uL — ABNORMAL HIGH (ref 0.7–4.0)
MCH: 31.3 pg (ref 26.0–34.0)
MCHC: 33.9 g/dL (ref 30.0–36.0)
MCV: 92.3 fL (ref 80.0–100.0)
Monocytes Absolute: 0.8 10*3/uL (ref 0.1–1.0)
Monocytes Relative: 6 %
Neutro Abs: 8.2 10*3/uL — ABNORMAL HIGH (ref 1.7–7.7)
Neutrophils Relative %: 58 %
Platelets: 182 10*3/uL (ref 150–400)
RBC: 4.79 MIL/uL (ref 4.22–5.81)
RDW: 13.1 % (ref 11.5–15.5)
WBC: 14.4 10*3/uL — ABNORMAL HIGH (ref 4.0–10.5)
nRBC: 0 % (ref 0.0–0.2)

## 2020-04-19 LAB — BASIC METABOLIC PANEL
Anion gap: 8 (ref 5–15)
BUN: 14 mg/dL (ref 6–20)
CO2: 23 mmol/L (ref 22–32)
Calcium: 8.7 mg/dL — ABNORMAL LOW (ref 8.9–10.3)
Chloride: 108 mmol/L (ref 98–111)
Creatinine, Ser: 1.12 mg/dL (ref 0.61–1.24)
GFR, Estimated: >60 mL/min (ref >60)
Glucose, Bld: 109 mg/dL — ABNORMAL HIGH (ref 70–99)
Potassium: 3.8 mmol/L (ref 3.5–5.1)
Sodium: 139 mmol/L (ref 135–145)

## 2020-04-19 LAB — GLUCOSE, CAPILLARY: Glucose-Capillary: 115 mg/dL — ABNORMAL HIGH (ref 70–99)

## 2020-04-19 LAB — HEPARIN LEVEL (UNFRACTIONATED): Heparin Unfractionated: 0.4 IU/mL (ref 0.30–0.70)

## 2020-04-19 MED ORDER — RIVAROXABAN 20 MG PO TABS: 20.0000 mg | ORAL_TABLET | Freq: Every day | ORAL | Status: DC

## 2020-04-19 MED ORDER — RIVAROXABAN (XARELTO) VTE STARTER PACK (15 & 20 MG): ORAL_TABLET | ORAL | 0 refills | Status: DC

## 2020-04-19 MED ORDER — RIVAROXABAN 15 MG PO TABS
15.0000 mg | ORAL_TABLET | Freq: Two times a day (BID) | ORAL | Status: DC
  Administered 2020-04-19: 15 mg via ORAL
  Filled 2020-04-19: qty 1

## 2020-04-19 NOTE — Progress Notes (Addendum)
[place] for heparin  Indication: pulmonary embolus  Allergies  Allergen Reactions  . Penicillins Other (See Comments)    Childhood reaction    Patient Measurements: Height: 6\' 1"  (185.4 cm) Weight: (!) 158.5 kg (349 lb 6.4 oz) IBW/kg (Calculated) : 79.9 Heparin Dosing Weight: 119.6 KG  Vital Signs: Temp: 97.9 F (36.6 C) (04/10 0453) Temp Source: Oral (04/10 0453) BP: 129/89 (04/10 0453) Pulse Rate: 88 (04/10 0453)  Labs: Recent Labs    04/17/20 1537 04/17/20 1636 04/18/20 0310 04/18/20 1003 04/19/20 0307  HGB 15.9  --  14.1  --  15.0  HCT 47.4  --  41.4  --  44.2  PLT 206  --  196  --  182  HEPARINUNFRC  --   --  0.47 0.47 0.40  CREATININE 1.29*  --  0.96  --  1.12  TROPONINIHS 32* 35*  --   --   --     Estimated Creatinine Clearance: 128.4 mL/min (by C-G formula based on SCr of 1.12 mg/dL).  Assessment: 50 y.o. male presenting 4/8 with SOB for multiple days with some chest pain. Reports having had COVID in November. Had a noncontrast CT done a few days ago which showed changes consistent with prior COVID infection. It was not able to rule out PE at that time. CTa Chest in ED consistent with submassive PE. Pharmacy has been consulted to start IV heparin.   Today, heparin level remains therapeutic at 0.40. CBC remains WNL. LE dopplers showing new right DVT.   Goal of Therapy:  Heparin level 0.3-0.7 units/ml Monitor platelets by anticoagulation protocol: Yes   Plan:  Continue heparin infusion at 2000 units/hr - Check anti-Xa level daily while on heparin - Monitor H&H and platelets, and s/sx of bleeding - F/u transition to PO [facility]  ADDENDUM 08:40  Pharmacy now asked to transition patient from IV heparin to Xarelto.  Plan: - STOP IV Heparin - Start Xarelto 15 mg PO BID x21 days, then 20 mg PO daily with meals   Claudina Lick, PharmD PGY1 [facility] Pharm[facility] Resident 04/19/2020 7:49 AM  Please check [email or web address] for  unit-specific pharmacy phone numbers.

## 2020-04-19 NOTE — Discharge Summary (Signed)
Physician Discharge Summary  [name] QMG:[id number] DOB: 1970/04/18 DOA: 04/17/2020  PCP: Barnetta Chapel, NP  Admit date: 04/17/2020 Discharge date: 04/19/2020  Admitted From: Home  Disposition:  Home   Recommendations for Outpatient Follow-up and new medication changes:  1. Follow up with Duard Larsen NP in 7 days.  2. Patient placed on anticoagulation with Rivaroxaban.  3. Second episode of venous thrombosis, this time with submassive pulmonary embolism, likely will need long term anticoagulation.   Home Health: no   Equipment/Devices: no    Discharge Condition: stable  CODE STATUS: full  Diet recommendation:  Heart healthy and diabetic prudent,   Brief/Interim Summary: Mr. Ritchey was admitted to the hospital with a working diagnosis of submassive bilateral pulmonary embolism.  50 year old male past medical history for obesity class III, type 2 diabetes mellitus, dyslipidemia, DVT, who had a COVID-19 viral infection October 2021 who presented with chest pain and dyspnea.  Patient developed a sudden onset of chest pain, pressure in nature, associated with dyspnea.  Due to persistent symptoms he came to the hospital for further evaluation.  On his initial physical examination blood pressure 150/78, heart rate 100, respiratory rate 21, oxygen saturation 95% on supplemental oxygen.  His lungs were clear to auscultation bilaterally, heart S1-S2, present, rhythmic, tachycardic, abdomen soft nontender, no lower extremity edema.  Sodium 137, potassium 4.2, chloride 104, bicarb 21, glucose 242, BUN 17, creatinine 1.29, troponin I 32-35, white count 15.6, hemoglobin 15.9, hematocrit 47.4, platelets 206. SARS COVID-19 negative.  Chest radiograph with bilateral lower lobe atelectasis. CT chest with nonocclusive thrombus within the right main pulmonary artery.  Partially/nearly occlusive thrombus within the bilateral segmental and subsegmental arterial branches.  CT evidence of right heart  strain.  Right base atelectasis.  EKG 113 bpm, left axis deviation, normal intervals, sinus rhythm, poor R wave progression, no ST segment changes, negative T waves V1-V3.  He was placed on anticoagulation with heparin drip with good toleration, he remained hemodynamically stable, his echocardiography showed no RV dysfunction, he was transitioned to oral anticoagulation.  1.  Acute bilateral submassive pulmonary embolism.  Patient was admitted to the medical ward, he was placed on a telemetry monitor.  Anticoagulation with IV heparin with good toleration. He remained hemodynamically stable, his heart rate remained between 70 and 80 bpm.  Oxygenation 96 to 97%.  Underwent further work-up with echocardiography which showed LV systolic function 65 to 32%, mild concentric left ventricular hypertrophy.  Right ventricle systolic function was normal.  Cavity mildly enlarged.  Mild elevation of pulmonary artery systolic pressure, estimated 36.2 mmHg.  Hyperkinesis of the RV apex compared to the base.   Ultrasonography of the lower extremities showed acute deep vein thrombosis involving the right popliteal vein, right posterior tibial veins and right peroneal veins.  Left lower extremity negative for DVT.  Troponin elevation due to acute pulmonary embolism.  Patient has been transitioned to rivaroxaban to optimize medical compliance.  Follow-up with primary care within 7 to 10 days.  2.  Hypertension.  His blood pressure remained well controlled, continue outpatient follow-up.  3.  Type 2 diabetes mellitus uncontrolled with hyperglycemia.  Patient was placed on insulin sliding scale with good toleration, his oral antihyperglycemic agents were resumed with good toleration.  At discharge his fasting glucose is 109.  4.  Obesity class III.  Calculated BMI 46.1, will need lifestyle modifications.  5.  Reactive leukocytosis.  White cell count trending down, at discharge 14.4, patient recently completed a  prednisone  taper.  Discharge Diagnoses:  Principal Problem:   Pulmonary embolism ([place]) Active Problems:   Morbid obesity ([place])   Hyperglycemia   Leucocytosis   Diabetes ([place])    Discharge Instructions   Allergies as of 04/19/2020      Reactions   Penicillins Other (See Comments)   Childhood reaction      Medication List    STOP taking these medications   ibuprofen 400 MG tablet Commonly known as: ADVIL   Nucynta 50 MG tablet Generic drug: tapentadol   omeprazole 20 MG capsule Commonly known as: PriLOSEC   ondansetron 8 MG disintegrating tablet Commonly known as: Zofran ODT   predniSONE 10 MG tablet Commonly known as: DELTASONE     TAKE these medications   metFORMIN 500 MG tablet Commonly known as: GLUCOPHAGE Take 1,000 mg by mouth 2 (two) times daily.   Rivaroxaban Stater Pack (15 mg and 20 mg) Commonly known as: XARELTO STARTER PACK Follow package directions: Take one 15mg  tablet by mouth twice a day. On day 22, switch to one 20mg  tablet once a day. Take with food.   Rybelsus 7 MG Tabs Generic drug: Semaglutide Take 7 mg by mouth daily.   tamsulosin 0.4 MG Caps capsule Commonly known as: FLOMAX Take 1 capsule (0.4 mg total) by mouth daily. What changed:   when to take this  reasons to take this       Allergies  Allergen Reactions  . Penicillins Other (See Comments)    Childhood reaction        Procedures/Studies: CT Angio Chest PE W and/or Wo Contrast  Result Date: 04/17/2020 CLINICAL DATA:  Shortness of breath EXAM: CT ANGIOGRAPHY CHEST WITH CONTRAST TECHNIQUE: Multidetector CT imaging of the chest was performed using the standard protocol during bolus administration of intravenous contrast. Multiplanar CT image reconstructions and MIPs were obtained to evaluate the vascular anatomy. CONTRAST:  58mL OMNIPAQUE IOHEXOL 350 MG/ML SOLN COMPARISON:  None. FINDINGS: Cardiovascular: There is a optimal opacification of the pulmonary arteries. No  central delayed filling defects are seen. There is a nonocclusive thrombus however seen within the right main pulmonary artery. Partially occlusive thrombus is seen within the right upper lobe, right middle lobe and right lower lobes segmental and subsegmental arterial branches. There is partially occlusive thrombus seen in the posterior left lower lobe and lingular segmental and subsegmental arterial branches. There is a trace pericardial effusion. There is evidence of right ventricular heart strain, RV/LV ratio 1.47. There is normal three-vessel brachiocephalic anatomy without proximal stenosis. The thoracic aorta is normal in appearance. Mediastinum/Nodes: No hilar, mediastinal, or axillary adenopathy. Thyroid gland, trachea, and esophagus demonstrate no significant findings. Lungs/Pleura: Rounded patchy airspace opacities are seen at the posterior right lung base. There is also streaky airspace opacity seen at the left lung base. No large airspace consolidation is noted. No pleural effusion. Upper Abdomen: No acute abnormalities present in the visualized portions of the upper abdomen. Musculoskeletal: No chest wall abnormality. No acute or significant osseous findings. Anterior flowing osteophytes seen in the mid to lower thoracic spine. Review of the MIP images confirms the above findings. IMPRESSION: 1. Nonocclusive thrombus seen within the right main pulmonary artery. There is partially/nearly occlusive thrombus seen within the bilateral segmental and subsegmental arterial branches as described above. CTevidence of right heart strain (RV/LV Ratio = 1.47) consistent with at least submassive (intermediate risk) PE. The presence of right heart strain has been associated with an increased risk of morbidity and mortality. 2. Rounded patchy airspace  opacity at the right lung base which may be due to atelectasis or mild pulmonary infarction 3. These results were called by telephone at the time of interpretation on  04/17/2020 at 6:08 pm to provider Dr. Joya Gaskins, who verbally acknowledged these results. Electronically Signed   By: Prudencio Pair M.D.   On: 04/17/2020 18:10   DG Chest Port 1 View  Result Date: 04/17/2020 CLINICAL DATA:  Shortness of breath.  Chest pain. EXAM: PORTABLE CHEST 1 VIEW COMPARISON:  12/16/2019 FINDINGS: Previous heterogeneous bilateral airspace disease has resolved. There is minimal residual subsegmental opacity at the right greater than left lung base suggestive of scarring. No new or progressive consolidation. Low lung volumes with bronchovascular crowding. Upper normal heart size, stable. No pleural effusion or pneumothorax. No acute osseous abnormalities are seen IMPRESSION: 1. Low lung volumes with bronchovascular crowding. 2. Previous bilateral COVID pneumonia has resolved with minimal residual subsegmental opacity at the bases, likely scarring. Electronically Signed   By: Keith Rake M.D.   On: 04/17/2020 15:24   ECHOCARDIOGRAM COMPLETE  Result Date: 04/18/2020    ECHOCARDIOGRAM REPORT   Patient Name:   BRELAND TROUTEN Date of Exam: 04/18/2020 Medical Rec #:  [id number]        Height:       73.0 in Accession #:    [id number]       Weight:       349.4 lb Date of Birth:  1970/01/28        BSA:          2.727 m Patient Age:    50 years         BP:           130/85 mmHg Patient Gender: M                HR:           74 bpm. Exam Location:  Inpatient Procedure: 2D Echo, Cardiac Doppler, Color Doppler and Intracardiac            Opacification Agent Indications:    Pulmonary embolus  History:        Patient has no prior history of Echocardiogram examinations.                 DVT, Signs/Symptoms:Shortness of Breath and Chest Pain; Risk                 Factors:Morbid obesity, Diabetes, Dyslipidemia and Hypertension.  Sonographer:    Dustin Flock Referring Phys: [place]  1. Left ventricular ejection fraction, by estimation, is 65 to 70%. The left ventricle has normal  function. The left ventricle has no regional wall motion abnormalities. There is mild concentric left ventricular hypertrophy most notably of the basal-septal segment. Left ventricular diastolic parameters were normal.  2. Right ventricular systolic function is normal. The right ventricular size is mildly enlarged. There is mildly elevated pulmonary artery systolic pressure. The estimated right ventricular systolic pressure is 82.9 mmHg. There is hyperkinesis of the RV  apex compared to the base consistent with known PE.  3. The mitral valve is normal in structure. No evidence of mitral valve regurgitation.  4. The aortic valve is tricuspid. Aortic valve regurgitation is not visualized. No aortic stenosis is present.  5. The inferior vena cava is dilated in size with <50% respiratory variability, suggesting right atrial pressure of 15 mmHg. Comparison(s): No prior Echocardiogram. FINDINGS  Left Ventricle: Left ventricular ejection fraction, by estimation, is  65 to 70%. The left ventricle has normal function. The left ventricle has no regional wall motion abnormalities. The left ventricular internal cavity size was normal in size. There is  mild concentric left ventricular hypertrophy most notably of the basal-septal segment. Left ventricular diastolic parameters were normal. Right Ventricle: The right ventricular size is mildly enlarged. No increase in right ventricular wall thickness. Right ventricular systolic function is normal. There is mildly elevated pulmonary artery systolic pressure. The tricuspid regurgitant velocity is 2.30 m/s, and with an assumed right atrial pressure of 15 mmHg, the estimated right ventricular systolic pressure is 65.0 mmHg. There is hyperkinesis of the RV apex compared to the base consistent with known PE. Left Atrium: Left atrial size was normal in size. Right Atrium: Right atrial size was normal in size. Pericardium: There is no evidence of pericardial effusion. Mitral Valve: The  mitral valve is normal in structure. No evidence of mitral valve regurgitation. Tricuspid Valve: The tricuspid valve is normal in structure. Tricuspid valve regurgitation is trivial. Aortic Valve: The aortic valve is tricuspid. Aortic valve regurgitation is not visualized. No aortic stenosis is present. Pulmonic Valve: The pulmonic valve was normal in structure. Pulmonic valve regurgitation is not visualized. Aorta: The aortic root is normal in size and structure. Venous: The inferior vena cava is dilated in size with less than 50% respiratory variability, suggesting right atrial pressure of 15 mmHg. IAS/Shunts: No atrial level shunt detected by color flow Doppler.  LEFT VENTRICLE PLAX 2D LVIDd:         5.20 cm  Diastology LVIDs:         2.95 cm  LV e' medial:    10.20 cm/s LV PW:         1.30 cm  LV E/e' medial:  7.1 LV IVS:        1.20 cm  LV e' lateral:   9.14 cm/s LVOT diam:     2.30 cm  LV E/e' lateral: 8.0 LV SV:         67 LV SV Index:   25 LVOT Area:     4.15 cm  RIGHT VENTRICLE RV Basal diam:  4.10 cm RV S prime:     12.10 cm/s TAPSE (M-mode): 3.0 cm LEFT ATRIUM             Index       RIGHT ATRIUM           Index LA diam:        3.40 cm 1.25 cm/m  RA Area:     18.60 cm LA Vol (A2C):   57.1 ml 20.94 ml/m RA Volume:   56.30 ml  20.64 ml/m LA Vol (A4C):   83.6 ml 30.65 ml/m LA Biplane Vol: 69.2 ml 25.37 ml/m  AORTIC VALVE LVOT Vmax:   79.00 cm/s LVOT Vmean:  58.000 cm/s LVOT VTI:    0.161 m  AORTA Ao Root diam: 3.70 cm MITRAL VALVE               TRICUSPID VALVE MV Area (PHT): 3.60 cm    TR Peak grad:   21.2 mmHg MV Decel Time: 211 msec    TR Vmax:        230.00 cm/s MV E velocity: 72.80 cm/s MV A velocity: 58.70 cm/s  SHUNTS MV E/A ratio:  1.24        Systemic VTI:  0.16 m  Systemic Diam: 2.30 cm Gwyndolyn Kaufman MD Electronically signed by Gwyndolyn Kaufman MD Signature Date/Time: 04/18/2020/1:25:24 PM    Final    VAS [place] LOWER EXTREMITY VENOUS (DVT)  Result Date:  04/18/2020  Lower Venous DVT Study Indications: Pulmonary embolism.  Risk Factors: History of Covid 19 11/21. Limitations: Body habitus and pain with compression. Comparison Study: No prior study on file Performing Technologist: Sharion Dove RVS  Examination Guidelines: A complete evaluation includes B-mode imaging, spectral Doppler, color Doppler, and power Doppler as needed of all accessible portions of each vessel. Bilateral testing is considered an integral part of a complete examination. Limited examinations for reoccurring indications may be performed as noted. The reflux portion of the exam is performed with the patient in reverse Trendelenburg.  +---------+---------------+---------+-----------+----------+-------------------+ RIGHT    CompressibilityPhasicitySpontaneityPropertiesThrombus Aging      +---------+---------------+---------+-----------+----------+-------------------+ CFV                     Yes      Yes                  patent by color and                                                       Doppler             +---------+---------------+---------+-----------+----------+-------------------+ SFJ                     Yes      Yes                  patent by color     +---------+---------------+---------+-----------+----------+-------------------+ FV Mid   Full                                                             +---------+---------------+---------+-----------+----------+-------------------+ FV DistalFull                                                             +---------+---------------+---------+-----------+----------+-------------------+ PFV                     Yes      Yes                  patent by color and                                                       Doppler             +---------+---------------+---------+-----------+----------+-------------------+ POP      None           No       No  Acute                +---------+---------------+---------+-----------+----------+-------------------+ PTV      None                                         Acute               +---------+---------------+---------+-----------+----------+-------------------+ PERO     None                                         Acute               +---------+---------------+---------+-----------+----------+-------------------+   +---------+---------------+---------+-----------+----------+--------------+ LEFT     CompressibilityPhasicitySpontaneityPropertiesThrombus Aging +---------+---------------+---------+-----------+----------+--------------+ CFV      Full           Yes      Yes                                 +---------+---------------+---------+-----------+----------+--------------+ SFJ      Full                                                        +---------+---------------+---------+-----------+----------+--------------+ FV Prox  Full                                                        +---------+---------------+---------+-----------+----------+--------------+ FV Mid   Full                                                        +---------+---------------+---------+-----------+----------+--------------+ FV DistalFull                                                        +---------+---------------+---------+-----------+----------+--------------+ PFV      Full                                                        +---------+---------------+---------+-----------+----------+--------------+ POP      Full           No       Yes                                 +---------+---------------+---------+-----------+----------+--------------+ PTV      Full                                                        +---------+---------------+---------+-----------+----------+--------------+  PERO     Full                                                         +---------+---------------+---------+-----------+----------+--------------+    Summary: RIGHT: - Findings consistent with acute deep vein thrombosis involving the right popliteal vein, right posterior tibial veins, and right peroneal veins.  LEFT: - No evidence of common femoral vein obstruction.  *See table(s) above for measurements and observations. Electronically signed by Deitra Mayo MD on 04/18/2020 at 1:54:23 PM.    Final       Subjective: Patient is feeling better, no nausea or vomiting, no chest pain or dyspnea,   Discharge Exam: Vitals:   04/19/20 0022 04/19/20 0453  BP: 121/83 129/89  Pulse: 85 88  Resp: 18 18  Temp: 97.7 F (36.5 C) 97.9 F (36.6 C)  SpO2: 97% 97%   Vitals:   04/18/20 1223 04/18/20 1702 04/19/20 0022 04/19/20 0453  BP: 130/85 128/83 121/83 129/89  Pulse: 74 73 85 88  Resp: 16 17 18 18   Temp: 97.9 F (36.6 C) 98 F (36.7 C) 97.7 F (36.5 C) 97.9 F (36.6 C)  TempSrc: Oral Oral Oral Oral  SpO2: 95% 97% 97% 97%  Weight:      Height:        General: Not in pain or dyspnea.,  Neurology: Awake and alert, non focal  E ENT: no pallor, no icterus, oral mucosa moist Cardiovascular: No JVD. S1-S2 present, rhythmic, no gallops, rubs, or murmurs. No lower extremity edema. Pulmonary: positive breath sounds bilaterally, with no wheezing, rhonchi or rales. Gastrointestinal. Abdomen soft and non tender Skin. No rashes Musculoskeletal: no joint deformities   The results of significant diagnostics from this hospitalization (including imaging, microbiology, ancillary and laboratory) are listed below for reference.     Microbiology: Recent Results (from the past 240 hour(s))  SARS CORONAVIRUS 2 (TAT 6-24 HRS) Nasopharyngeal Nasopharyngeal Swab     Status: None   Collection Time: 04/17/20  8:20 PM   Specimen: Nasopharyngeal Swab  Result Value Ref Range Status   SARS Coronavirus 2 NEGATIVE NEGATIVE Final    Comment: (NOTE) SARS-CoV-2 target nucleic  acids are NOT DETECTED.  The SARS-CoV-2 RNA is generally detectable in upper and lower respiratory specimens during the acute phase of infection. Negative results do not preclude SARS-CoV-2 infection, do not rule out co-infections with other pathogens, and should not be used as the sole basis for treatment or other patient management decisions. Negative results must be combined with clinical observations, patient history, and epidemiological information. The expected result is Negative.  Fact Sheet for Patients: SugarRoll.be  Fact Sheet for Healthcare Providers: https://www.woods-mathews.com/  This test is not yet approved or cleared by the [place] FDA and  has been authorized for detection and/or diagnosis of SARS-CoV-2 by FDA under an Emergency Use Authorization (EUA). This EUA will remain  in effect (meaning this test can be used) for the duration of the COVID-19 declaration under Se ction 564(b)(1) of the Act, 21 U.S.C. section 360bbb-3(b)(1), unless the authorization is terminated or revoked sooner.  Performed at [facility], [place] [address] [place], [place] [address]      Labs: BNP (last 3 results) Recent Labs    04/17/20 1537  BNP 64.4   Basic Metabolic Panel: Recent Labs  Lab 04/17/20 1537 04/18/20 0310 04/19/20 0307  NA 137 139 139  K 4.2 3.8 3.8  CL 104 109 108  CO2 21* 24 23  GLUCOSE 242* 149* 109*  BUN 17 16 14   CREATININE 1.29* 0.96 1.12  CALCIUM 9.3 8.8* 8.7*   Liver Function Tests: Recent Labs  Lab 04/17/20 1537 04/18/20 0310  AST 49* 32  ALT 56* 45*  ALKPHOS 63 57  BILITOT 0.8 1.0  PROT 6.7 5.9*  ALBUMIN 3.3* 2.8*   No results for input(s): LIPASE, AMYLASE in the last 168 hours. No results for input(s): AMMONIA in the last 168 hours. CBC: Recent Labs  Lab 04/17/20 1537 04/18/20 0310 04/19/20 0307  WBC 15.6* 17.1* 14.4*  NEUTROABS 12.4*  --  8.2*  HGB 15.9 14.1 15.0  HCT 47.4  41.4 44.2  MCV 92.9 92.2 92.3  PLT 206 196 182   Cardiac Enzymes: No results for input(s): CKTOTAL, CKMB, CKMBINDEX, TROPONINI in the last 168 hours. BNP: Invalid input(s): POCBNP CBG: Recent Labs  Lab 04/18/20 0616 04/18/20 1059 04/18/20 1611 04/18/20 2125 04/19/20 0559  GLUCAP 108* 117* 146* 117* 115*   D-Dimer No results for input(s): DDIMER in the last 72 hours. Hgb A1c Recent Labs    04/17/20 2203  HGBA1C 7.2*   Lipid Profile No results for input(s): CHOL, HDL, LDLCALC, TRIG, CHOLHDL, LDLDIRECT in the last 72 hours. Thyroid function studies No results for input(s): TSH, T4TOTAL, T3FREE, THYROIDAB in the last 72 hours.  Invalid input(s): FREET3 Anemia work up No results for input(s): VITAMINB12, FOLATE, FERRITIN, TIBC, IRON, RETICCTPCT in the last 72 hours. Urinalysis No results found for: COLORURINE, APPEARANCEUR, [place], [place], [place], [place], [place], [place], PROTEINUR, UROBILINOGEN, NITRITE, LEUKOCYTESUR Sepsis Labs Invalid input(s): PROCALCITONIN,  WBC,  LACTICIDVEN Microbiology Recent Results (from the past 240 hour(s))  SARS CORONAVIRUS 2 (TAT 6-24 HRS) Nasopharyngeal Nasopharyngeal Swab     Status: None   Collection Time: 04/17/20  8:20 PM   Specimen: Nasopharyngeal Swab  Result Value Ref Range Status   SARS Coronavirus 2 NEGATIVE NEGATIVE Final    Comment: (NOTE) SARS-CoV-2 target nucleic acids are NOT DETECTED.  The SARS-CoV-2 RNA is generally detectable in upper and lower respiratory specimens during the acute phase of infection. Negative results do not preclude SARS-CoV-2 infection, do not rule out co-infections with other pathogens, and should not be used as the sole basis for treatment or other patient management decisions. Negative results must be combined with clinical observations, patient history, and epidemiological information. The expected result is Negative.  Fact Sheet for  Patients: SugarRoll.be  Fact Sheet for Healthcare Providers: https://www.woods-mathews.com/  This test is not yet approved or cleared by the [place] FDA and  has been authorized for detection and/or diagnosis of SARS-CoV-2 by FDA under an Emergency Use Authorization (EUA). This EUA will remain  in effect (meaning this test can be used) for the duration of the COVID-19 declaration under Se ction 564(b)(1) of the Act, 21 U.S.C. section 360bbb-3(b)(1), unless the authorization is terminated or revoked sooner.  Performed at [facility], [place] [address] [place], [place] [address]      Time coordinating discharge: 45 minutes  SIGNED:   Tawni Millers, MD  [facility] 04/19/2020, 8:24 AM

## 2020-04-19 NOTE — TOC Transition Note (Signed)
Transition of Care (TOC) - CM/SW Discharge Note Marvetta Gibbons RN, BSN Transitions of Care Unit 4E- RN Case Manager See Treatment Team for direct phone # Weekend cross coverage    Patient Details  Name: ASHTON BELOTE MRN: [id number] Date of Birth: 05-Feb-1970  Transition of Care [facility]) CM/SW Contact:  Dawayne Patricia, RN Phone Number: 04/19/2020, 10:52 AM   Clinical Narrative:    Pt stable for transition home today, referral received from MD regarding Xarelto cost. Unable to check cost with insurance today as they are not open on the weekend. MD has sent script to [facility] which is open from 10am-6pm today.  Several attempts have been made to [facility] to speak with pharmacist regarding pt's cost under his insurance have not been able to get through to speak with anyone.  Cm spoke with pt at bedside, provided pt with both 30 day free card to use on discharge today as well as copay assist card for future use. Pt has decided to go on and will speak with pharmacy when he gets there regarding his copay cost under his insurance.     Final next level of care: Home/Self Care Barriers to Discharge: No Barriers Identified   Patient Goals and CMS Choice     Choice offered to / list presented to : NA  Discharge Placement               home        Discharge Plan and Services In-house Referral: NA Discharge Planning Services: CM Consult Post Acute Care Choice: NA          DME Arranged: N/A DME Agency: NA       HH Arranged: NA HH Agency: NA        Social Determinants of Health (SDOH) Interventions     Readmission Risk Interventions Readmission Risk Prevention Plan 04/19/2020  Post Dischage Appt Complete  Medication Screening Complete  Transportation Screening Complete  Some recent data might be hidden

## 2020-04-19 NOTE — Discharge Instructions (Signed)

## 2020-05-15 ENCOUNTER — Ambulatory Visit: Payer: BC Managed Care – PPO | Admitting: Pulmonary Disease

## 2020-05-15 ENCOUNTER — Other Ambulatory Visit: Payer: Self-pay

## 2020-05-15 ENCOUNTER — Encounter: Payer: Self-pay | Admitting: Pulmonary Disease

## 2020-05-15 VITALS — BP 124/68 | HR 91 | Ht 73.0 in | Wt 352.6 lb

## 2020-05-15 DIAGNOSIS — I2699 Other pulmonary embolism without acute cor pulmonale: Secondary | ICD-10-CM

## 2020-05-15 NOTE — Patient Instructions (Signed)
Will recommend you stay on the blood thinners for a year  Call with any significant concerns  I will see you back in 6 months  Pay attention to the color of the urine, so far not showing any signs that you are losing blood

## 2020-05-15 NOTE — Progress Notes (Signed)
Nicholas [name]    [id number]    02-16-1970  Primary Care Physician:Inman, Faylene Million, NP  Referring Physician: Barnetta Chapel, NP [address] [place],  [place] [address]  Chief complaint:  Patient being seen for recent blood clot  HPI:  Diagnosed with COVID  Progressive shortness of breath led to evaluation in the hospital when he had some chest discomfort-showed that he had blood clots Currently on Xarelto  Stated he had what I see documented in the chart as cellulitis in 2015, there was an associated blood clot at the time as well  No family history of pulm embolic disease  History of thrombophlebitis, diabetes, morbid obesity  Never smoker  Morbid obesity  Outpatient Encounter Medications as of 05/15/2020  Medication Sig  . metFORMIN (GLUCOPHAGE) 500 MG tablet Take 1,000 mg by mouth 2 (two) times daily.  Marland Kitchen RIVAROXABAN (XARELTO) VTE STARTER PACK (15 & 20 MG) Follow package directions: Take one 15mg  tablet by mouth twice a day. On day 22, switch to one 20mg  tablet once a day. Take with food.  . RYBELSUS 7 MG TABS Take 7 mg by mouth daily.  . tamsulosin (FLOMAX) 0.4 MG CAPS capsule Take 1 capsule (0.4 mg total) by mouth daily. (Patient not taking: Reported on 05/15/2020)   No facility-administered encounter medications on file as of 05/15/2020.    Allergies as of 05/15/2020 - Review Complete 05/15/2020  Allergen Reaction Noted  . Penicillins Other (See Comments)     Past Medical History:  Diagnosis Date  . Cellulitis of right leg 09/17/2013  . Dyspnea     Past Surgical History:  Procedure Laterality Date  . CLAVICLE HARDWARE REMOVAL Left 2007  . EXTRACORPOREAL SHOCK WAVE LITHOTRIPSY Right 05/16/2019   Procedure: EXTRACORPOREAL SHOCK WAVE LITHOTRIPSY (ESWL);  Surgeon: [name], MD;  Location: [facility] ORS;  Service: Urology;  Laterality: Right;  . EXTRACORPOREAL SHOCK WAVE LITHOTRIPSY Right 07/18/2019   Procedure: EXTRACORPOREAL SHOCK WAVE LITHOTRIPSY  (ESWL);  Surgeon: [name], MD;  Location: [facility] OR[facility];  Service: Urology;  Laterality: Right;  . EXTRACORPOREAL SHOCK WAVE LITHOTRIPSY Left 10/24/2019   Procedure: EXTRACORPOREAL SHOCK WAVE LITHOTRIPSY (ESWL);  Surgeon: [name], MD;  Location: [facility] ORS;  Service: Urology;  Laterality: Left;  . FRACTURE SURGERY Left 2007   "shattered clavicle; broke it in 9 places"    Family History  Problem Relation Age of Onset  . Leukemia Mother   . Kidney disease Father   . Heart attack Maternal Grandfather   . Heart attack Paternal Grandfather   . Stroke Other   . Heart disease Other   . Cancer Other     Social History   Socioeconomic History  . Marital status: Single    Spouse name: Not on file  . Number of children: Not on file  . Years of education: Not on file  . Highest education level: Not on file  Occupational History  . Occupation: Dealer    Comment: [employer]  Tobacco Use  . Smoking status: Never Smoker  . Smokeless tobacco: Never Used  Vaping Use  . Vaping Use: Never used  Substance and Sexual Activity  . Alcohol use: No  . Drug use: No  . Sexual activity: Not Currently  Other Topics Concern  . Not on file  Social History Narrative  . Not on file   Social Determinants of Health   Financial Resource Strain: Not on file  Food  Insecurity: Not on file  Transportation Needs: Not on file  Physical Activity: Not on file  Stress: Not on file  Social Connections: Not on file  Intimate Partner Violence: Not on file    Review of Systems  Constitutional: Negative for fatigue.  Respiratory: Negative for chest tightness and shortness of breath.     Vitals:   05/15/20 1600  BP: 124/68  Pulse: 91  SpO2: 96%     Physical Exam Constitutional:      Appearance: He is obese.  HENT:     Head: Normocephalic and atraumatic.     Nose: No congestion.     Mouth/Throat:     Mouth: Mucous membranes are moist.  Cardiovascular:     Rate and  Rhythm: Normal rate and regular rhythm.     Heart sounds: No murmur heard. No friction rub. No gallop.   Pulmonary:     Effort: No respiratory distress.     Breath sounds: No stridor. No wheezing or rhonchi.  Musculoskeletal:     Cervical back: No rigidity or tenderness.  Neurological:     Mental Status: He is alert.  Psychiatric:        Mood and Affect: Mood normal.      Data Reviewed: CT scan of the chest was reviewed with the patient showing multiple PEs  Assessment:  Pulmonary embolism  Recent COVID infection  Morbid obesity  Clinically stable at the present time on Xarelto  Plan/Recommendations: Continue Xarelto  Will recommend 1 year of treatment  I will see him back in about 6 months  Encouraged to continue current treatment, weight loss efforts encouraged   Sherrilyn Rist MD [facility] 05/15/2020, 4:04 PM  CC: Barnetta Chapel, NP

## 2020-11-26 ENCOUNTER — Other Ambulatory Visit: Payer: Self-pay | Admitting: Urology

## 2020-11-26 DIAGNOSIS — R310 Gross hematuria: Secondary | ICD-10-CM

## 2020-12-02 ENCOUNTER — Encounter: Payer: Self-pay | Admitting: Pulmonary Disease

## 2020-12-02 ENCOUNTER — Ambulatory Visit: Payer: BC Managed Care – PPO | Admitting: Pulmonary Disease

## 2020-12-02 ENCOUNTER — Other Ambulatory Visit: Payer: Self-pay

## 2020-12-02 VITALS — BP 122/78 | HR 74 | Temp 97.6°F | Ht 73.0 in | Wt 342.0 lb

## 2020-12-02 DIAGNOSIS — I2699 Other pulmonary embolism without acute cor pulmonale: Secondary | ICD-10-CM

## 2020-12-02 NOTE — Progress Notes (Signed)
Nicholas [name]    [id number]    [birth year]  Primary Care Physician:Inman, Faylene Million, NP  Referring Physician: Barnetta Chapel, NP [address] [place],  [place] [address]  Chief complaint:   Follow-up for pulmonary embolism  HPI:  Developed pulmonary embolism following COVID diagnosis  Has been on anticoagulation for about 6 months now, the goal was to have him on anticoagulation for about a year  He did have what was felt to be hematuria and its been evaluated by urology at present Stated that his most recent blood work did not show anemia -Being evaluated by urology at present  He remains on Xarelto at present  Stated he had what I see documented in the chart as cellulitis in 2015, there was an associated blood clot at the time as well  No family history of pulm embolic disease  History of thrombophlebitis, diabetes, morbid obesity  Never smoker  Morbid obesity  Outpatient Encounter Medications as of 12/02/2020  Medication Sig   losartan (COZAAR) 25 MG tablet Take 12.5 mg by mouth daily.   metFORMIN (GLUCOPHAGE) 500 MG tablet Take 1,000 mg by mouth 2 (two) times daily.   RIVAROXABAN (XARELTO) VTE STARTER PACK (15 & 20 MG) Follow package directions: Take one 15mg  tablet by mouth twice a day. On day 22, switch to one 20mg  tablet once a day. Take with food.   rosuvastatin (CRESTOR) 5 MG tablet Take 5 mg by mouth at bedtime.   RYBELSUS 7 MG TABS Take 7 mg by mouth daily.   [DISCONTINUED] levofloxacin (LEVAQUIN) 500 MG tablet Take by mouth. (Patient not taking: Reported on 12/02/2020)   [DISCONTINUED] tamsulosin (FLOMAX) 0.4 MG CAPS capsule Take 1 capsule (0.4 mg total) by mouth daily. (Patient not taking: Reported on 05/15/2020)   No facility-administered encounter medications on file as of 12/02/2020.    Allergies as of 12/02/2020 - Review Complete 12/02/2020  Allergen Reaction Noted   Penicillins Other (See Comments)     Past Medical History:  Diagnosis  Date   Cellulitis of right leg 09/17/2013   Dyspnea     Past Surgical History:  Procedure Laterality Date   CLAVICLE HARDWARE REMOVAL Left 2007   EXTRACORPOREAL SHOCK WAVE LITHOTRIPSY Right 05/16/2019   Procedure: EXTRACORPOREAL SHOCK WAVE LITHOTRIPSY (ESWL);  Surgeon: [name], MD;  Location: [facility];  Service: Urology;  Laterality: Right;   EXTRACORPOREAL SHOCK WAVE LITHOTRIPSY Right 07/18/2019   Procedure: EXTRACORPOREAL SHOCK WAVE LITHOTRIPSY (ESWL);  Surgeon: [name], MD;  Location: [facility];  Service: Urology;  Laterality: Right;   EXTRACORPOREAL SHOCK WAVE LITHOTRIPSY Left 10/24/2019   Procedure: EXTRACORPOREAL SHOCK WAVE LITHOTRIPSY (ESWL);  Surgeon: [name], MD;  Location: [facility];  Service: Urology;  Laterality: Left;   FRACTURE SURGERY Left 2007   "shattered clavicle; broke it in 9 places"    Family History  Problem Relation Age of Onset   Leukemia Mother    Kidney disease Father    Heart attack Maternal Grandfather    Heart attack Paternal Grandfather    Stroke Other    Heart disease Other    Cancer Other     Social History   Socioeconomic History   Marital status: Single    Spouse name: Not on file   Number of children: Not on file   Years of education: Not on file   Highest education level: Not on file  Occupational History   Occupation: Dealer  Comment: [employer]  Tobacco Use   Smoking status: Never   Smokeless tobacco: Never  Vaping Use   Vaping Use: Never used  Substance and Sexual Activity   Alcohol use: No   Drug use: No   Sexual activity: Not Currently  Other Topics Concern   Not on file  Social History Narrative   Not on file   Social Determinants of Health   Financial Resource Strain: Not on file  Food Insecurity: Not on file  Transportation Needs: Not on file  Physical Activity: Not on file  Stress: Not on file  Social Connections: Not on file  Intimate Partner Violence: Not on file     Review of Systems  Constitutional:  Negative for fatigue.  Respiratory:  Negative for chest tightness and shortness of breath.    Vitals:   12/02/20 0917  BP: 122/78  Pulse: 74  Temp: 97.6 F (36.4 C)  SpO2: 97%     Physical Exam Constitutional:      Appearance: He is obese.  HENT:     Head: Normocephalic and atraumatic.     Nose: No congestion.     Mouth/Throat:     Mouth: Mucous membranes are moist.  Cardiovascular:     Rate and Rhythm: Normal rate and regular rhythm.     Heart sounds: No murmur heard.   No friction rub. No gallop.  Pulmonary:     Effort: No respiratory distress.     Breath sounds: No stridor. No wheezing or rhonchi.  Musculoskeletal:     Cervical back: No rigidity or tenderness.  Neurological:     Mental Status: He is alert.  Psychiatric:        Mood and Affect: Mood normal.     Data Reviewed: CT scan of the chest was reviewed with the patient showing multiple PEs  Assessment:   Pulmonary embolism  Recent COVID infection  Morbid obesity  Remains clinically stable on Xarelto at present -Goal is to stop at a year  Plan/Recommendations: Continue Xarelto  Continue Xarelto for another 6 months  I will see him back in about 6 months  Sherrilyn Rist MD [facility] 12/02/2020, 9:44 AM  CC: Barnetta Chapel, NP

## 2020-12-02 NOTE — Patient Instructions (Signed)
Continue blood thinners for another 6 months to make a year in total  We can stop the blood thinner after that time  If the risk with bleeding starts to become significant where were thinking we need blood transfusions, this risk then outweighs continuing blood thinners-at that time, a decision is to be made about discontinuing blood thinners  Call with significant concerns   I will see you 6 months from here

## 2020-12-21 ENCOUNTER — Ambulatory Visit
Admission: RE | Admit: 2020-12-21 | Discharge: 2020-12-21 | Disposition: A | Discharge status: Home / Self Care | Payer: BC Managed Care – PPO | Source: Ambulatory Visit | Attending: Urology | Admitting: Urology

## 2020-12-21 ENCOUNTER — Other Ambulatory Visit: Payer: Self-pay

## 2020-12-21 DIAGNOSIS — R310 Gross hematuria: Secondary | ICD-10-CM

## 2020-12-21 LAB — POCT I-STAT CREATININE: Creatinine, Ser: 0.9 mg/dL (ref 0.61–1.24)

## 2020-12-21 MED ORDER — IOHEXOL 300 MG/ML  SOLN
100.0000 mL | Freq: Once | INTRAMUSCULAR | Status: AC | PRN
  Administered 2020-12-21: 100 mL via INTRAVENOUS

## 2020-12-25 ENCOUNTER — Telehealth: Payer: Self-pay | Admitting: Pulmonary Disease

## 2020-12-25 ENCOUNTER — Other Ambulatory Visit: Payer: Self-pay

## 2020-12-25 ENCOUNTER — Encounter
Admission: RE | Admit: 2020-12-25 | Discharge: 2020-12-25 | Disposition: A | Discharge status: Home / Self Care | Payer: BC Managed Care – PPO | Source: Ambulatory Visit | Attending: Urology | Admitting: Urology

## 2020-12-25 DIAGNOSIS — E119 Type 2 diabetes mellitus without complications: Secondary | ICD-10-CM

## 2020-12-25 DIAGNOSIS — I2699 Other pulmonary embolism without acute cor pulmonale: Secondary | ICD-10-CM

## 2020-12-25 HISTORY — DX: Acute embolism and thrombosis of unspecified deep veins of unspecified lower extremity: I82.409

## 2020-12-25 HISTORY — DX: Type 2 diabetes mellitus without complications: E11.9

## 2020-12-25 HISTORY — DX: Malignant (primary) neoplasm, unspecified: C80.1

## 2020-12-25 HISTORY — DX: Morbid (severe) obesity due to excess calories: E66.01

## 2020-12-25 HISTORY — DX: COVID-19: U07.1

## 2020-12-25 HISTORY — DX: Personal history of urinary calculi: Z87.442

## 2020-12-25 NOTE — Telephone Encounter (Signed)
Called and spoke with patient. He stated that he is currently scheduled for surgery on 12/22. He is having a procedure done to break up a large kidney stone. He called his surgeon's office for recommendations but they have already closed for the day.   He wants to know when to stop taking the Xarelto and when to begin again after surgery.   AO, can you please advise? Thanks!

## 2020-12-25 NOTE — Progress Notes (Signed)
Perioperative Services Pre-Admission/Anesthesia Testing   Date: 12/25/20 Name: Nicholas [name] MRN:   [id number]  Re: Consideration of preoperative prophylactic antibiotic change   Request sent to: [name], MD (routed and/or faxed via [facility])  Planned Surgical Procedure(s):    Case: [id number] Date/Time: 12/31/20 6144   Procedure: CYSTOSCOPY/URETEROSCOPY/HOLMIUM LASER/STENT PLACEMENT (Right)   Anesthesia type: Choice   Pre-op diagnosis:      KIDNEY/URETER CALCULUS     HYDRONEPHROSIS   Location: [facility]   Surgeons: [name], MD   Clinical Notes:  Patient has a documented allergy to PCN  The actual reaction to PCN is unknown/. Reaction occurred as a child.   Screened as appropriate for cephalosporin use during medication reconciliation No immediate angioedema, dysphagia, SOB, anaphylaxis symptoms. No severe rash involving mucous membranes or skin necrosis. No hospital admissions related to side effects of PCN/cephalosporin use.  No documented reaction to PCN or cephalosporin in the last 10 years.  Request:  As an evidence based approach to reducing the rate of incidence for post-operative SSI and the development of MDROs, could an agent with narrower coverage for preoperative prophylaxis in this patient's upcoming surgical course be considered?   Currently ordered preoperative prophylactic ABX:  levofloxacin .   Specifically requesting change to cephalosporin (CEFAZOLIN).   Please communicate decision with me and I will change the orders in Epic as per your direction.   Things to consider: Many patients report that they were "allergic" to PCN earlier in life, however this does not translate into a true lifelong allergy. Patients can lose sensitivity to specific IgE antibodies over time if PCN is avoided ([name], 2019).  Up to 10% of the adult population and 15% of hospitalized patients report an allergy to PCN,  however clinical studies suggest that 90% of those reporting an allergy can tolerate PCN antibiotics ([name], 2019).  Cross-sensitivity between PCN and cephalosporins has been documented as being as high as 10%, however this estimation included data believed to have been collected in a setting where there was contamination. Newer data suggests that the prevalence of cross-sensitivity between PCN and cephalosporins is actually estimated to be closer to 1% (Hermanides et al., 2018).   Patients labeled as PCN allergic, whether they are truly allergic or not, have been found to have inferior outcomes in terms of rates of serious infection, and these patients tend to have longer hospital stays ([name], 2019).  Treatment related secondary infections, such as Clostridioides difficile, have been linked to the improper use of broad spectrum antibiotics in patients improperly labeled as PCN allergic ([name], 2019).  Anaphylaxis from cephalosporins is rare and the evidence suggests that there is no increased risk of an anaphylactic type reaction when cephalosporins are used in a PCN allergic patient (Pichichero, 2006).  Citations: [name], [name], [name], [name]. Presumed ?-Lactam Allergy and Cross-reactivity in the Operating Theater: A Practical Approach. Anesthesiology. 2018 Aug;129(2):335-342. doi: 10.1097/ALN.0000000000002252. PMID: 31540086.  Kleris, [place]., & Lugar, P. L. (2019). Things We Do For No Reason: Failing to Question a Penicillin Allergy History. Journal of hospital medicine, 14(10), 586 830 4052. Advance online publication. https://www.wallace-middleton.info/  Pichichero, M. E. (2006). Cephalosporins can be prescribed safely for penicillin-allergic patients. Journal of family medicine, 55(2), 106-112. Accessed: https://cdn.mdedge.com/files/s55fs-public/Document/September-2017/5502JFP_AppliedEvidence1.pdf   [name], MSN, APRN, FNP-C, CEN [facility]  Peri-operative Services Nurse Practitioner FAX: [phone] 12/25/20 3:04 PM

## 2020-12-25 NOTE — H&P (Signed)
NAME: Nicholas [name], Nicholas [name] MEDICAL RECORD NO: [id number] ACCOUNT NO: [id number] DATE OF BIRTH: [birth year] FACILITY: [facility] LOCATION: [facility] PHYSICIAN: Otelia Limes. Yves Dill, MD  History and Physical   DATE OF ADMISSION: 12/31/2020  Same day surgery scheduled 12/31/2020.  CHIEF COMPLAINT:  Right flank pain.  HISTORY OF PRESENT ILLNESS:  The patient is a 50 year old white male with intermittent gross hematuria since November associated with right flank pain radiating anteriorly.  A CT scan was done on 12/12 indicating right hydronephrosis to the level of an 8  mm stone in the distal right ureter just proximal to the UVJ.  He comes in now for right ureteroscopic ureterolithotomy.  He has a past history of kidney stones and underwent bilateral lithotripsy in 2021.  PAST MEDICAL HISTORY: ALLERGIES:  PATIENT WAS ALLERGIC TO PENICILLIN.  CURRENT MEDICATIONS:  Xarelto, Rybelsus, metformin, losartan, and Crestor.  PAST SURGICAL HISTORY:  1.  Left shoulder surgery 2007. 2.  Bilateral ESWL 2021.  PAST AND CURRENT MEDICAL CONDITIONS: 1.  History of deep venous thrombosis 2011. 2.  Pulmonary embolus April 2022. 3.  Diabetes.  REVIEW OF SYSTEMS:  The patient denied chest pain, but has exertional shortness of breath.  He denied history of heart disease or stroke. He denied hypertension.  SOCIAL HISTORY:  The patient denied cigarette use.  He denied alcohol use.  FAMILY HISTORY:  Father died at age 12 of chronic renal disease.  Mother died at age 48 of unspecified cancer.  PHYSICAL EXAMINATION:   VITAL SIGNS:  Height 6 feet 1 inch, weight 396 pounds, BMI of 50. GENERAL:  Obese white male in no distress. HEENT:  Sclerae were clear.  Pupils are equally round, reactive to light and accommodation.  Extraocular movements are intact. NECK:  No palpable neck masses. LYMPHATIC:  No palpable cervical or inguinal adenopathy. LUNGS:  Clear to auscultation. CARDIOVASCULAR:  Regular rhythm and rate  without audible murmurs. ABDOMEN:  Soft and nontender abdomen. BACK:  No CVA tenderness. GENITOURINARY:  Deferred. RECTAL:  Deferred. NEUROMUSCULAR:  Alert and oriented x3.  IMPRESSION:  1.  Right distal ureterolithiasis with hydronephrosis and renal colic. 2.  Bilateral nephrolithiasis.  PLAN:  Cystoscopy with right retrograde and ureteroscopic ureterolithotomy with holmium laser lithotripsy and possible stent placement.   PUS D: 12/25/2020 11:15:46 am T: 12/25/2020 11:45:00 am  JOB: [id number]/ [id number]

## 2020-12-25 NOTE — Telephone Encounter (Signed)
Can stop Xarelto about 72 hours prior to intervention, may resume 24 hours afterwards  -Surgeon should be able to guide when to resume if intervention is more complex

## 2020-12-25 NOTE — Patient Instructions (Signed)
Your procedure is scheduled on:12-31-20 Thursday Report to the Registration Desk on the 1st floor of the [place].Then proceed to the 2nd floor Surgery Desk in the [facility] To find out your arrival time, please call [phone] between 1PM - 3PM on:12-30-20 Wednesday  REMEMBER: Instructions that are not followed completely may result in serious medical risk, up to and including death; or upon the discretion of your surgeon and anesthesiologist your surgery may need to be rescheduled.  Do not eat food after midnight the night before surgery.  No gum chewing, lozengers or hard candies.  You may however, drink water up to 2 hours before you are scheduled to arrive for your surgery. Do not drink anything within 2 hours of your scheduled arrival time.  Type 1 and Type 2 diabetics should only drink water.  Do NOT take any medication the day of surgery  Stop your metFORMIN (GLUCOPHAGE) 2 days prior to surgery-Last dose on 12-28-20 Monday  Call [facility] office today (12-25-20) about when you need to stop your rivaroxaban Alveda Reasons)  One week prior to surgery: Stop Anti-inflammatories (NSAIDS) such as Advil, Aleve, Ibuprofen, Motrin, Naproxen, Naprosyn and Aspirin based products such as Excedrin, Goodys Powder, BC Powder.You may however, take Tylenol if needed for pain up until the day of surgery.  Stop ANY OVER THE COUNTER supplements/vitamins NOW (12-25-20) until after surgery.  No Alcohol for 24 hours before or after surgery.  No Smoking including e-cigarettes for 24 hours prior to surgery.  No chewable tobacco products for at least 6 hours prior to surgery.  No nicotine patches on the day of surgery.  Do not use any "recreational" drugs for at least a week prior to your surgery.  Please be advised that the combination of cocaine and anesthesia may have negative outcomes, up to and including death. If you test positive for cocaine, your surgery will be cancelled.  On the  morning of surgery brush your teeth with toothpaste and water, you may rinse your mouth with mouthwash if you wish. Do not swallow any toothpaste or mouthwash.  Do not wear jewelry, make-up, hairpins, clips or nail polish.  Do not wear lotions, powders, or perfumes.   Do not shave body from the neck down 48 hours prior to surgery just in case you cut yourself which could leave a site for infection.  Also, freshly shaved skin may become irritated if using the CHG soap.  Contact lenses, hearing aids and dentures may not be worn into surgery.  Do not bring valuables to the hospital. [facility] is not responsible for any missing/lost belongings or valuables.   Notify your doctor if there is any change in your medical condition (cold, fever, infection).  Wear comfortable clothing (specific to your surgery type) to the hospital.  After surgery, you can help prevent lung complications by doing breathing exercises.  Take deep breaths and cough every 1-2 hours. Your doctor may order a device called an Incentive Spirometer to help you take deep breaths. When coughing or sneezing, hold a pillow firmly against your incision with both hands. This is called splinting. Doing this helps protect your incision. It also decreases belly discomfort.  If you are being admitted to the hospital overnight, leave your suitcase in the car. After surgery it may be brought to your room.  If you are being discharged the day of surgery, you will not be allowed to drive home. You will need a responsible adult (18 years or older) to drive  you home and stay with you that night.   If you are taking public transportation, you will need to have a responsible adult (18 years or older) with you. Please confirm with your physician that it is acceptable to use public transportation.   Please call the [facility] Dept. at [phone] if you have any questions about these instructions.  Surgery Visitation  Policy:  Patients undergoing a surgery or procedure may have one family member or support person with them as long as that person is not COVID-19 positive or experiencing its symptoms.  That person may remain in the waiting area during the procedure and may rotate out with other people.  Inpatient Visitation:    Visiting hours are 7 a.m. to 8 p.m. Up to two visitors ages 16+ are allowed at one time in a patient room. The visitors may rotate out with other people during the day. Visitors must check out when they leave, or other visitors will not be allowed. One designated support person may remain overnight. The visitor must pass COVID-19 screenings, use hand sanitizer when entering and exiting the patients room and wear a mask at all times, including in the patients room. Patients must also wear a mask when staff or their visitor are in the room. Masking is required regardless of vaccination status.

## 2020-12-25 NOTE — Telephone Encounter (Signed)
Called and spoke with patient. He verbalized understanding and appreciated AO answering for him.   Nothing further needed at time of call.

## 2020-12-28 ENCOUNTER — Encounter
Admission: RE | Admit: 2020-12-28 | Discharge: 2020-12-28 | Disposition: A | Discharge status: Home / Self Care | Payer: BC Managed Care – PPO | Source: Ambulatory Visit | Attending: Orthopedic Surgery | Admitting: Orthopedic Surgery

## 2020-12-28 ENCOUNTER — Other Ambulatory Visit: Payer: Self-pay

## 2020-12-28 DIAGNOSIS — I2699 Other pulmonary embolism without acute cor pulmonale: Secondary | ICD-10-CM

## 2020-12-28 DIAGNOSIS — E119 Type 2 diabetes mellitus without complications: Secondary | ICD-10-CM | POA: Insufficient documentation

## 2020-12-28 DIAGNOSIS — N132 Hydronephrosis with renal and ureteral calculous obstruction: Secondary | ICD-10-CM | POA: Diagnosis present

## 2020-12-28 DIAGNOSIS — Z01818 Encounter for other preprocedural examination: Secondary | ICD-10-CM | POA: Insufficient documentation

## 2020-12-28 DIAGNOSIS — I10 Essential (primary) hypertension: Secondary | ICD-10-CM | POA: Insufficient documentation

## 2020-12-28 LAB — BASIC METABOLIC PANEL
Anion gap: 6 (ref 5–15)
BUN: 14 mg/dL (ref 6–20)
CO2: 28 mmol/L (ref 22–32)
Calcium: 9.5 mg/dL (ref 8.9–10.3)
Chloride: 104 mmol/L (ref 98–111)
Creatinine, Ser: 1.09 mg/dL (ref 0.61–1.24)
GFR, Estimated: >60 mL/min (ref >60)
Glucose, Bld: 115 mg/dL — ABNORMAL HIGH (ref 70–99)
Potassium: 3.9 mmol/L (ref 3.5–5.1)
Sodium: 138 mmol/L (ref 135–145)

## 2020-12-28 LAB — CBC
HCT: 43.8 % (ref 39.0–52.0)
Hemoglobin: 14.7 g/dL (ref 13.0–17.0)
MCH: 30.5 pg (ref 26.0–34.0)
MCHC: 33.6 g/dL (ref 30.0–36.0)
MCV: 90.9 fL (ref 80.0–100.0)
Platelets: 269 10*3/uL (ref 150–400)
RBC: 4.82 MIL/uL (ref 4.22–5.81)
RDW: 13.3 % (ref 11.5–15.5)
WBC: 11.1 10*3/uL — ABNORMAL HIGH (ref 4.0–10.5)
nRBC: 0 % (ref 0.0–0.2)

## 2020-12-28 NOTE — Telephone Encounter (Signed)
OV notes and clearance form faxed back to [facility] . Nothing further needed at this time.

## 2020-12-28 NOTE — Telephone Encounter (Signed)
Fax received from Dr. Maryan Puls with [facility] to perform a cysto/retrograde/stent/uu with laser on patient.  Patient needs surgery clearance he is scheduled for 12/31/20. Patient was seen on 12/02/2020. Office protocol is a risk assessment can be sent to surgeon if patient has been seen in 60 days or less.   Sending to Dr. Ander Slade for risk assessment

## 2020-12-28 NOTE — Telephone Encounter (Signed)
Patient is cleared for surgery  Was not having significant respiratory symptoms at the time he was seen No acutely reversible problems  To hold Xarelto for at least 72 hours prior to intervention May resume Xarelto depending on surgical intervention provided for at least 24 hours after surgery

## 2020-12-31 ENCOUNTER — Ambulatory Visit: Payer: BC Managed Care – PPO | Admitting: Registered Nurse

## 2020-12-31 ENCOUNTER — Encounter
Admission: RE | Disposition: A | Discharge status: Home / Self Care | Payer: Self-pay | Source: Home / Self Care | Attending: Urology

## 2020-12-31 ENCOUNTER — Encounter: Payer: Self-pay | Admitting: Urology

## 2020-12-31 ENCOUNTER — Other Ambulatory Visit: Payer: Self-pay

## 2020-12-31 ENCOUNTER — Ambulatory Visit
Admission: RE | Admit: 2020-12-31 | Discharge: 2020-12-31 | Disposition: A | Discharge status: Home / Self Care | Payer: BC Managed Care – PPO | Attending: Urology | Admitting: Urology

## 2020-12-31 ENCOUNTER — Ambulatory Visit: Payer: BC Managed Care – PPO

## 2020-12-31 ENCOUNTER — Ambulatory Visit: Payer: BC Managed Care – PPO | Admitting: Urgent Care

## 2020-12-31 DIAGNOSIS — N132 Hydronephrosis with renal and ureteral calculous obstruction: Secondary | ICD-10-CM | POA: Insufficient documentation

## 2020-12-31 DIAGNOSIS — E119 Type 2 diabetes mellitus without complications: Secondary | ICD-10-CM | POA: Insufficient documentation

## 2020-12-31 DIAGNOSIS — N201 Calculus of ureter: Secondary | ICD-10-CM

## 2020-12-31 HISTORY — PX: CYSTOSCOPY/URETEROSCOPY/HOLMIUM LASER/STENT PLACEMENT: SHX6546

## 2020-12-31 LAB — GLUCOSE, CAPILLARY
Glucose-Capillary: 116 mg/dL — ABNORMAL HIGH (ref 70–99)
Glucose-Capillary: 103 mg/dL — ABNORMAL HIGH (ref 70–99)

## 2020-12-31 SURGERY — CYSTOSCOPY/URETEROSCOPY/HOLMIUM LASER/STENT PLACEMENT
Anesthesia: General | Laterality: Right

## 2020-12-31 MED ORDER — CHLORHEXIDINE GLUCONATE 0.12 % MT SOLN
OROMUCOSAL | Status: AC
  Filled 2020-12-31: qty 15

## 2020-12-31 MED ORDER — MEPERIDINE HCL 25 MG/ML IJ SOLN
6.2500 mg | INTRAMUSCULAR | Status: DC | PRN
Start: 2020-12-31 — End: 2020-12-31

## 2020-12-31 MED ORDER — DEXAMETHASONE SODIUM PHOSPHATE 10 MG/ML IJ SOLN
INTRAMUSCULAR | Status: DC | PRN
  Administered 2020-12-31: 5 mg via INTRAVENOUS

## 2020-12-31 MED ORDER — LEVOFLOXACIN IN D5W 500 MG/100ML IV SOLN
INTRAVENOUS | Status: AC
  Filled 2020-12-31: qty 100

## 2020-12-31 MED ORDER — DEXAMETHASONE SODIUM PHOSPHATE 10 MG/ML IJ SOLN
INTRAMUSCULAR | Status: AC
  Filled 2020-12-31: qty 1

## 2020-12-31 MED ORDER — FENTANYL CITRATE (PF) 100 MCG/2ML IJ SOLN
INTRAMUSCULAR | Status: AC
  Filled 2020-12-31: qty 2

## 2020-12-31 MED ORDER — ORAL CARE MOUTH RINSE: 15.0000 mL | Freq: Once | OROMUCOSAL | Status: AC

## 2020-12-31 MED ORDER — FENTANYL CITRATE (PF) 100 MCG/2ML IJ SOLN: 25.0000 ug | INTRAMUSCULAR | Status: DC | PRN

## 2020-12-31 MED ORDER — PROPOFOL 10 MG/ML IV BOLUS
INTRAVENOUS | Status: DC | PRN
  Administered 2020-12-31: 80 mg via INTRAVENOUS
  Administered 2020-12-31: 220 mg via INTRAVENOUS

## 2020-12-31 MED ORDER — ROCURONIUM BROMIDE 100 MG/10ML IV SOLN
INTRAVENOUS | Status: DC | PRN
  Administered 2020-12-31: 50 mg via INTRAVENOUS
  Administered 2020-12-31: 30 mg via INTRAVENOUS

## 2020-12-31 MED ORDER — BELLADONNA ALKALOIDS-OPIUM 16.2-60 MG RE SUPP
RECTAL | Status: DC | PRN
  Administered 2020-12-31: 1 via RECTAL

## 2020-12-31 MED ORDER — ONDANSETRON HCL 4 MG/2ML IJ SOLN
INTRAMUSCULAR | Status: AC
  Filled 2020-12-31: qty 2

## 2020-12-31 MED ORDER — LIDOCAINE HCL (PF) 2 % IJ SOLN
INTRAMUSCULAR | Status: AC
  Filled 2020-12-31: qty 5

## 2020-12-31 MED ORDER — BELLADONNA ALKALOIDS-OPIUM 16.2-60 MG RE SUPP
RECTAL | Status: AC
  Filled 2020-12-31: qty 1

## 2020-12-31 MED ORDER — SODIUM CHLORIDE 0.9 % IR SOLN
Status: DC | PRN
  Administered 2020-12-31: 3000 mL via INTRAVESICAL

## 2020-12-31 MED ORDER — LIDOCAINE HCL (CARDIAC) PF 100 MG/5ML IV SOSY
PREFILLED_SYRINGE | INTRAVENOUS | Status: DC | PRN
  Administered 2020-12-31: 100 mg via INTRAVENOUS

## 2020-12-31 MED ORDER — SODIUM CHLORIDE 0.9 % IV SOLN: INTRAVENOUS | Status: DC

## 2020-12-31 MED ORDER — LIDOCAINE HCL URETHRAL/MUCOSAL 2 % EX GEL
CUTANEOUS | Status: AC
  Filled 2020-12-31: qty 10

## 2020-12-31 MED ORDER — ACETAMINOPHEN 10 MG/ML IV SOLN
INTRAVENOUS | Status: DC | PRN
  Administered 2020-12-31: 1000 mg via INTRAVENOUS

## 2020-12-31 MED ORDER — CHLORHEXIDINE GLUCONATE 0.12 % MT SOLN
15.0000 mL | Freq: Once | OROMUCOSAL | Status: AC
  Administered 2020-12-31: 08:00:00 15 mL via OROMUCOSAL

## 2020-12-31 MED ORDER — SUGAMMADEX SODIUM 500 MG/5ML IV SOLN
INTRAVENOUS | Status: DC | PRN
  Administered 2020-12-31: 500 mg via INTRAVENOUS

## 2020-12-31 MED ORDER — FAMOTIDINE 20 MG PO TABS
ORAL_TABLET | ORAL | Status: AC
  Filled 2020-12-31: qty 1

## 2020-12-31 MED ORDER — URIBEL 118 MG PO CAPS: 1.0000 | ORAL_CAPSULE | Freq: Four times a day (QID) | ORAL | 3 refills | Status: DC | PRN

## 2020-12-31 MED ORDER — FAMOTIDINE 20 MG PO TABS
20.0000 mg | ORAL_TABLET | Freq: Once | ORAL | Status: AC
  Administered 2020-12-31: 08:00:00 20 mg via ORAL

## 2020-12-31 MED ORDER — CIPROFLOXACIN HCL 500 MG PO TABS: 500.0000 mg | ORAL_TABLET | Freq: Two times a day (BID) | ORAL | 0 refills | Status: DC

## 2020-12-31 MED ORDER — MIDAZOLAM HCL 2 MG/2ML IJ SOLN
INTRAMUSCULAR | Status: DC | PRN
  Administered 2020-12-31: 2 mg via INTRAVENOUS

## 2020-12-31 MED ORDER — LEVOFLOXACIN IN D5W 500 MG/100ML IV SOLN
500.0000 mg | Freq: Once | INTRAVENOUS | Status: AC
  Administered 2020-12-31: 09:00:00 500 mg via INTRAVENOUS

## 2020-12-31 MED ORDER — LIDOCAINE HCL URETHRAL/MUCOSAL 2 % EX GEL
CUTANEOUS | Status: DC | PRN
  Administered 2020-12-31: 1 via URETHRAL

## 2020-12-31 MED ORDER — FENTANYL CITRATE (PF) 100 MCG/2ML IJ SOLN
INTRAMUSCULAR | Status: DC | PRN
  Administered 2020-12-31 (×2): 50 ug via INTRAVENOUS

## 2020-12-31 MED ORDER — ONDANSETRON HCL 4 MG/2ML IJ SOLN: 4.0000 mg | Freq: Once | INTRAMUSCULAR | Status: DC | PRN

## 2020-12-31 MED ORDER — MIDAZOLAM HCL 2 MG/2ML IJ SOLN
INTRAMUSCULAR | Status: AC
  Filled 2020-12-31: qty 2

## 2020-12-31 MED ORDER — ROCURONIUM BROMIDE 10 MG/ML (PF) SYRINGE
PREFILLED_SYRINGE | INTRAVENOUS | Status: AC
  Filled 2020-12-31: qty 10

## 2020-12-31 MED ORDER — ONDANSETRON HCL 4 MG/2ML IJ SOLN
INTRAMUSCULAR | Status: DC | PRN
  Administered 2020-12-31: 4 mg via INTRAVENOUS

## 2020-12-31 MED ORDER — IOHEXOL 180 MG/ML  SOLN
INTRAMUSCULAR | Status: DC | PRN
  Administered 2020-12-31: 10:00:00 10 mL

## 2020-12-31 SURGICAL SUPPLY — 23 items
BAG DRAIN CYSTO-URO LG1000N (MISCELLANEOUS) ×3 IMPLANT
BALLN URETL DIL 6X4 (BALLOONS) ×3
BALLOON URETL DIL 6X4 (BALLOONS) IMPLANT
FIBER LASER MOSES 365 DFL (Laser) ×2 IMPLANT
GAUZE 4X4 16PLY ~~LOC~~+RFID DBL (SPONGE) ×6 IMPLANT
GLOVE SURG ENC MOIS LTX SZ7 (GLOVE) ×6 IMPLANT
GLOVE SURG ENC MOIS LTX SZ7.5 (GLOVE) ×3 IMPLANT
GOWN STRL REUS W/ TWL LRG LVL4 (GOWN DISPOSABLE) ×1 IMPLANT
GOWN STRL REUS W/ TWL XL LVL3 (GOWN DISPOSABLE) ×1 IMPLANT
GOWN STRL REUS W/TWL LRG LVL4 (GOWN DISPOSABLE) ×3
GOWN STRL REUS W/TWL XL LVL3 (GOWN DISPOSABLE) ×3
GUIDEWIRE STR ZIPWIRE 035X150 (MISCELLANEOUS) ×3 IMPLANT
IV NS IRRIG 3000ML ARTHROMATIC (IV SOLUTION) ×3 IMPLANT
KIT TURNOVER CYSTO (KITS) ×3 IMPLANT
MANIFOLD NEPTUNE II (INSTRUMENTS) ×3 IMPLANT
PACK CYSTO AR (MISCELLANEOUS) ×3 IMPLANT
SET CYSTO W/LG BORE CLAMP LF (SET/KITS/TRAYS/PACK) ×3 IMPLANT
SOL PREP PVP 2OZ (MISCELLANEOUS) ×3
SOLUTION PREP PVP 2OZ (MISCELLANEOUS) ×1 IMPLANT
STENT URET 6FRX24 CONTOUR (STENTS) ×3 IMPLANT
STENT URET 6FRX26 CONTOUR (STENTS) ×5 IMPLANT
WATER STERILE IRR 1000ML POUR (IV SOLUTION) ×3 IMPLANT
WATER STERILE IRR 500ML POUR (IV SOLUTION) ×3 IMPLANT

## 2020-12-31 NOTE — Op Note (Signed)
Preoperative diagnosis: 1.  Right ureterolithiasis (N20.1)                                           2.  Right hydronephrosis (N13.2)  Postoperative diagnosis: Same  Procedure: 1.  Right ureteroscopic ureterolithotomy with holmium laser lithotripsy (CPT 6186868479)                     2.  Right double-pigtail ureteral stent placement (CPT 88916)                     3.  Fluoroscopy (CPT 76000)  Surgeon: Otelia Limes. Yves Dill MD  Anesthesia: General  Indications:See the history and physical. After informed consent the above procedure(s) were requested     Technique and findings: After adequate general anesthesia been obtained the patient was placed into dorsal lithotomy position and the perineum was prepped and draped in the usual fashion.  Fluoroscopy revealed a distal 8 mm right ureteral calculus.  The 21 French cystoscope was then coupled to the camera and visually advanced into the bladder.  Bladder was thoroughly inspected.  No bladder tumors were identified.  The right ureteral orifice was identified and a 0.035 guidewire passed up the ureter beyond the stone.  Using the balloon dilating ureteral catheter the intramural ureter was dilated to 6 mm.  The balloon dilating catheter was removed taking care to leave the guidewire in position.  The cystoscope was also removed again taking care to leave the guidewire in position.  The short mini rigid ureteroscope was coupled the camera and advanced up to the level of the stone.  The 365 m holmium laser fiber was introduced through the scope and set at the dusting setting.  The stone was then completely pulverized.  The ureteroscope was then removed and cystoscope backloaded over the guidewire.  A 6 x 26 cm double-pigtail ureteral stent with retrieval suture attached was advanced over the guidewire using fluoroscopic guidance.  Guidewire was then removed taking care leave the stent in position.  The bladder was drained and cystoscope removed.  10 cc of viscous  Xylocaine was instilled within the urethra.  A B&O suppository was placed.  Blood loss was minimal.  Procedure was then terminated and patient transferred to the recovery room in stable condition.

## 2020-12-31 NOTE — Discharge Instructions (Addendum)
AMBULATORY SURGERY  DISCHARGE INSTRUCTIONS   The drugs that you were given will stay in your system until tomorrow so for the next 24 hours you should not:  Drive an automobile Make any legal decisions Drink any alcoholic beverage   You may resume regular meals tomorrow.  Today it is better to start with liquids and gradually work up to solid foods.  You may eat anything you prefer, but it is better to start with liquids, then soup and crackers, and gradually work up to solid foods.   Please notify your doctor immediately if you have any unusual bleeding, trouble breathing, redness and pain at the surgery site, drainage, fever, or pain not relieved by medication.    Additional Instructions:    Please contact your physician with any problems or Same Day Surgery at [phone], Monday through Friday 6 am to 4 pm, or [facility] number at [phone].     Ureteral Stent Implantation, Care After This sheet gives you information about how to care for yourself after your procedure. Your health care provider may also give you more specific instructions. If you have problems or questions, contact your health care provider. What can I expect after the procedure? After the procedure, it is common to have: Nausea. Mild pain when you urinate. You may feel this pain in your lower back or lower abdomen. The pain should stop within a few minutes after you urinate. This may last for up to 1 week. A small amount of blood in your urine for several days. Follow these instructions at home: Medicines Take over-the-counter and prescription medicines only as told by your health care provider. If you were prescribed an antibiotic medicine, take it as told by your health care provider. Do not stop taking the antibiotic even if you start to feel better. Do not drive for 24 hours if you were given a sedative during your procedure. Ask your health care provider if the medicine  prescribed to you requires you to avoid driving or using heavy machinery. Activity Rest as told by your health care provider. Avoid sitting for a long time without moving. Get up to take short walks every 1-2 hours. This is important to improve blood flow and breathing. Ask for help if you feel weak or unsteady. Return to your normal activities as told by your health care provider. Ask your health care provider what activities are safe for you. General instructions Watch for any blood in your urine. Call your health care provider if the amount of blood in your urine increases. If you have a catheter: Follow instructions from your health care provider about taking care of your catheter and collection bag. Do not take baths, swim, or use a hot tub until your health care provider approves. Ask your health care provider if you may take showers. You may only be allowed to take sponge baths. Drink enough fluid to keep your urine pale yellow. Do not use any products that contain nicotine or tobacco, such as cigarettes, e-cigarettes, and chewing tobacco. These can delay healing after surgery. If you need help quitting, ask your health care provider. Keep all follow-up visits as told by your health care provider. This is important. Contact a health care provider if: You have pain that gets worse or does not get better with medicine, especially pain when you urinate. You have difficulty urinating. You feel nauseous or you vomit repeatedly during a period of more than 2 days after the procedure. Get  help right away if: Your urine is dark red or has blood clots in it. You are leaking urine (have incontinence). The end of the stent comes out of your urethra. You cannot urinate. You have sudden, sharp, or severe pain in your abdomen or lower back. You have a fever. You have swelling or pain in your legs. You have difficulty breathing. Summary After the procedure, it is common to have mild pain when you  urinate that goes away within a few minutes after you urinate. This may last for up to 1 week. Watch for any blood in your urine. Call your health care provider if the amount of blood in your urine increases. Take over-the-counter and prescription medicines only as told by your health care provider. Drink enough fluid to keep your urine pale yellow. This information is not intended to replace advice given to you by your health care provider. Make sure you discuss any questions you have with your health care provider. Document Revised: 10/03/2017 Document Reviewed: 10/04/2017 Elsevier Patient Education  2022 [employer].

## 2020-12-31 NOTE — H&P (Signed)
Date of Initial H&P: 12/25/20  History reviewed, patient examined, no change in status, stable for surgery.

## 2020-12-31 NOTE — Transfer of Care (Signed)
Immediate Anesthesia Transfer of Care Note  Patient: Nicholas [name] [facility]  Procedure(s) Performed: CYSTOSCOPY/URETEROSCOPY/HOLMIUM LASER/STENT PLACEMENT (Right)  Patient Location: PACU  Anesthesia Type:General  Level of Consciousness: drowsy  Airway & Oxygen Therapy: Patient Spontanous Breathing and Patient connected to face mask oxygen  Post-op Assessment: Report given to RN and Post -op Vital signs reviewed and stable  Post vital signs: Reviewed and stable  Last Vitals:  Vitals Value Taken Time  BP 131/95 12/31/20 1003  Temp    Pulse 77 12/31/20 1006  Resp 11 12/31/20 1006  SpO2 94 % 12/31/20 1006  Vitals shown include unvalidated device data.  Last Pain:  Vitals:   12/31/20 0752  TempSrc: Oral  PainSc: 3          Complications: No notable events documented.

## 2020-12-31 NOTE — Anesthesia Procedure Notes (Signed)
Procedure Name: Intubation Date/Time: 12/31/2020 8:59 AM Performed by: Lia Foyer, CRNA Pre-anesthesia Checklist: Patient identified, Emergency Drugs available, Suction available and Patient being monitored Patient Re-evaluated:Patient Re-evaluated prior to induction Oxygen Delivery Method: Circle system utilized Preoxygenation: Pre-oxygenation with 100% oxygen Induction Type: IV induction Ventilation: Mask ventilation without difficulty Laryngoscope Size: McGraph and 4 Grade View: Grade I Tube type: Oral Number of attempts: 1 Airway Equipment and Method: Stylet and Video-laryngoscopy Placement Confirmation: ETT inserted through vocal cords under direct vision, positive ETCO2 and breath sounds checked- equal and bilateral Secured at: 23 cm Tube secured with: Tape Dental Injury: Teeth and Oropharynx as per pre-operative assessment

## 2020-12-31 NOTE — Anesthesia Preprocedure Evaluation (Signed)
Anesthesia Evaluation  Patient identified by MRN, date of birth, ID band Patient awake    Reviewed: Allergy & Precautions, NPO status , Patient's Chart, lab work & pertinent test results  Airway Mallampati: III  TM Distance: >3 FB Neck ROM: Full    Dental no notable dental hx.    Pulmonary shortness of breath and with exertion,    Pulmonary exam normal        Cardiovascular hypertension, Pt. on medications Normal cardiovascular exam     Neuro/Psych negative neurological ROS  negative psych ROS   GI/Hepatic negative GI ROS, Neg liver ROS,   Endo/Other  diabetes, Well Controlled, Oral Hypoglycemic AgentsMorbid obesity  Renal/GU negative Renal ROS  negative genitourinary   Musculoskeletal negative musculoskeletal ROS (+)   Abdominal   Peds negative pediatric ROS (+)  Hematology negative hematology ROS (+)   Anesthesia Other Findings Cancer ([place])  basal cell skin cancer  Cellulitis of right leg 09/17/2013  COVID-19    Diabetes mellitus without complication ([place])    DVT (deep venous thrombosis) ([facility]) Dyspnea  due to PE  History of kidney stones   Morbid obesity ([facility])    Pulmonary embolism ([place]) 04/2020      Reproductive/Obstetrics negative OB ROS                             Anesthesia Physical Anesthesia Plan  ASA: 3  Anesthesia Plan: General   Post-op Pain Management:    Induction: Intravenous  PONV Risk Score and Plan: 2 and Propofol infusion, Ondansetron and Midazolam  Airway Management Planned: Oral ETT  Additional Equipment:   Intra-op Plan:   Post-operative Plan: Extubation in OR  Informed Consent: I have reviewed the patients History and Physical, chart, labs and discussed the procedure including the risks, benefits and alternatives for the proposed anesthesia with the patient or authorized representative who has indicated his/her understanding and acceptance.        Plan Discussed with: CRNA, Anesthesiologist and Surgeon  Anesthesia Plan Comments:         Anesthesia Quick Evaluation

## 2020-12-31 NOTE — Anesthesia Postprocedure Evaluation (Signed)
Anesthesia Post Note  Patient: Nicholas [name]  Procedure(s) Performed: CYSTOSCOPY/URETEROSCOPY/HOLMIUM LASER/STENT PLACEMENT (Right)  Patient location during evaluation: PACU Anesthesia Type: General Level of consciousness: awake and alert, awake and oriented Pain management: pain level controlled Vital Signs Assessment: post-procedure vital signs reviewed and stable Respiratory status: spontaneous breathing, nonlabored ventilation and respiratory function stable Cardiovascular status: blood pressure returned to baseline and stable Postop Assessment: no apparent nausea or vomiting Anesthetic complications: no   No notable events documented.   Last Vitals:  Vitals:   12/31/20 1026 12/31/20 1048  BP: 119/81 122/84  Pulse: 75 (!) 55  Resp: 15 16  Temp:  (!) 36.1 C  SpO2: 98% 100%    Last Pain:  Vitals:   12/31/20 1048  TempSrc: Temporal  PainSc: 0-No pain                 Phill Mutter

## 2021-05-26 ENCOUNTER — Ambulatory Visit: Payer: BC Managed Care – PPO | Admitting: Pulmonary Disease

## 2021-05-26 ENCOUNTER — Encounter: Payer: Self-pay | Admitting: Pulmonary Disease

## 2021-05-26 VITALS — BP 126/78 | HR 92 | Temp 98.6°F | Ht 73.0 in | Wt 352.0 lb

## 2021-05-26 DIAGNOSIS — I2699 Other pulmonary embolism without acute cor pulmonale: Secondary | ICD-10-CM | POA: Diagnosis not present

## 2021-05-26 DIAGNOSIS — R0609 Other forms of dyspnea: Secondary | ICD-10-CM | POA: Diagnosis not present

## 2021-05-26 NOTE — Patient Instructions (Signed)
Schedule for ultrasound of the heart-echocardiogram to assess cardiac function ? ?Blood work for predisposition to blood clots ? ?I will see you in about 3 to 4 weeks ? ? ? ?Stop the blood thinner ? ?Information about obstructive sleep apnea ? ?Living With Sleep Apnea ?Sleep apnea is a condition in which breathing pauses or becomes shallow during sleep. Sleep apnea is most commonly caused by a collapsed or blocked airway. People with sleep apnea usually snore loudly. They may have times when they gasp and stop breathing for 10 seconds or more during sleep. This may happen many times during the night. ?The breaks in breathing also interrupt the deep sleep that you need to feel rested. Even if you do not completely wake up from the gaps in breathing, your sleep may not be restful and you feel tired during the day. You may also have a headache in the morning and low energy during the day, and you may feel anxious or depressed. ?How can sleep apnea affect me? ?Sleep apnea increases your chances of extreme tiredness during the day (daytime fatigue). It can also increase your risk for health conditions, such as: ?Heart attack. ?Stroke. ?Obesity. ?Type 2 diabetes. ?Heart failure. ?Irregular heartbeat. ?High blood pressure. ?If you have daytime fatigue as a result of sleep apnea, you may be more likely to: ?Perform poorly at school or work. ?Fall asleep while driving. ?Have difficulty with attention. ?Develop depression or anxiety. ?Have sexual dysfunction. ?What actions can I take to manage sleep apnea? ?Sleep apnea treatment ? ?If you were given a device to open your airway while you sleep, use it only as told by your health care provider. You may be given: ?An oral appliance. This is a custom-made mouthpiece that shifts your lower jaw forward. ?A continuous positive airway pressure (CPAP) device. This device blows air through a mask when you breathe out (exhale). ?A nasal expiratory positive airway pressure (EPAP)  device. This device has valves that you put into each nostril. ?A bi-level positive airway pressure (BIPAP) device. This device blows air through a mask when you breathe in (inhale) and breathe out (exhale). ?You may need surgery if other treatments do not work for you. ?Sleep habits ?Go to sleep and wake up at the same time every day. This helps set your internal clock (circadian rhythm) for sleeping. ?If you stay up later than usual, such as on weekends, try to get up in the morning within 2 hours of your normal wake time. ?Try to get at least 7-9 hours of sleep each night. ?Stop using a computer, tablet, and mobile phone a few hours before bedtime. ?Do not take long naps during the day. If you nap, limit it to 30 minutes. ?Have a relaxing bedtime routine. Reading or listening to music may relax you and help you sleep. ?Use your bedroom only for sleep. ?Keep your television and computer out of your bedroom. ?Keep your bedroom cool, dark, and quiet. ?Use a supportive mattress and pillows. ?Follow your health care provider's instructions for other changes to sleep habits. ?Nutrition ?Do not eat heavy meals in the evening. ?Do not have caffeine in the later part of the day. The effects of caffeine can last for more than 5 hours. ?Follow your health care provider's or dietitian's instructions for any diet changes. ?Lifestyle ? ?  ? ?Do not drink alcohol before bedtime. Alcohol can cause you to fall asleep at first, but then it can cause you to wake up in the middle of  the night and have trouble getting back to sleep. ?Do not use any products that contain nicotine or tobacco. These products include cigarettes, chewing tobacco, and vaping devices, such as e-cigarettes. If you need help quitting, ask your health care provider. ?Medicines ?Take over-the-counter and prescription medicines only as told by your health care provider. ?Do not use over-the-counter sleep medicine. You can become dependent on this medicine, and  it can make sleep apnea worse. ?Do not use medicines, such as sedatives and narcotics, unless told by your health care provider. ?Activity ?Exercise on most days, but avoid exercising in the evening. Exercising near bedtime can interfere with sleeping. ?If possible, spend time outside every day. Natural light helps regulate your circadian rhythm. ?General information ?Lose weight if you need to, and maintain a healthy weight. ?Keep all follow-up visits. This is important. ?If you are having surgery, make sure to tell your health care provider that you have sleep apnea. You may need to bring your device with you. ?Where to find more information ?Learn more about sleep apnea and daytime fatigue from: ?American Sleep Association: sleepassociation.org ?National Sleep Foundation: sleepfoundation.org ?National Heart, Lung, and Blood Institute: https://www.hartman-hill.biz/ ?Summary ?Sleep apnea is a condition in which breathing pauses or becomes shallow during sleep. ?Sleep apnea can cause daytime fatigue and other serious health conditions. ?You may need to wear a device while sleeping to help keep your airway open. ?If you are having surgery, make sure to tell your health care provider that you have sleep apnea. You may need to bring your device with you. ?Making changes to sleep habits, diet, lifestyle, and activity can help you manage sleep apnea. ?This information is not intended to replace advice given to you by your health care provider. Make sure you discuss any questions you have with your health care provider. ?Document Revised: 08/05/2020 Document Reviewed: 12/06/2019 ?Elsevier Patient Education ? [employer]. ? ?

## 2021-05-27 ENCOUNTER — Encounter: Payer: Self-pay | Admitting: Pulmonary Disease

## 2021-05-27 NOTE — Progress Notes (Signed)
Nicholas [name]    [id number]    01/12/1970  Primary Care Physician:Inman, Faylene Million, NP  Referring Physician: Barnetta Chapel, NP [address] [place],  [place] [address]  Chief complaint:   Follow-up for pulmonary embolism Has been on blood thinners for about a year  HPI:  Developed pulmonary embolism following COVID diagnosis  Has been on anticoagulation for about 1 year now, the goal was to have him on anticoagulation for about a year  He did have what was felt to be hematuria, evaluated by urology, found to have kidney stones  He remains on Xarelto at present  Stated he had what I see documented in the chart as cellulitis in 2015, there was an associated blood clot at the time as well  No family history of pulm embolic disease  History of thrombophlebitis, diabetes, morbid obesity  Never smoker  Morbid obesity  Outpatient Encounter Medications as of 05/26/2021  Medication Sig   losartan (COZAAR) 25 MG tablet Take 12.5 mg by mouth at bedtime.   metFORMIN (GLUCOPHAGE) 500 MG tablet Take 1,000 mg by mouth 2 (two) times daily.   Meth-Hyo-M Bl-Na Phos-Ph Sal (URIBEL) 118 MG CAPS Take 1 capsule (118 mg total) by mouth every 6 (six) hours as needed.   rivaroxaban (XARELTO) 20 MG TABS tablet Take 20 mg by mouth daily with supper.   RIVAROXABAN (XARELTO) VTE STARTER PACK (15 & 20 MG) Follow package directions: Take one '15mg'$  tablet by mouth twice a day. On day 22, switch to one '20mg'$  tablet once a day. Take with food.   rosuvastatin (CRESTOR) 5 MG tablet Take 5 mg by mouth at bedtime.   RYBELSUS 7 MG TABS Take 7 mg by mouth every morning.   [DISCONTINUED] ciprofloxacin (CIPRO) 500 MG tablet Take 1 tablet (500 mg total) by mouth 2 (two) times daily. (Patient not taking: Reported on 05/26/2021)   No facility-administered encounter medications on file as of 05/26/2021.    Allergies as of 05/26/2021 - Review Complete 05/26/2021  Allergen Reaction Noted   Penicillins  Other (See Comments)     Past Medical History:  Diagnosis Date   Cancer ([place])    basal cell skin cancer   Cellulitis of right leg 09/17/2013   COVID-19    Diabetes mellitus without complication ([place])    DVT (deep venous thrombosis) ([place])    Dyspnea    due to PE   History of kidney stones    Morbid obesity ([place])    Pulmonary embolism ([place]) 04/2020    Past Surgical History:  Procedure Laterality Date   CLAVICLE HARDWARE REMOVAL Left 2007   CYSTOSCOPY/URETEROSCOPY/HOLMIUM LASER/STENT PLACEMENT Right 12/31/2020   Procedure: CYSTOSCOPY/URETEROSCOPY/HOLMIUM LASER/STENT PLACEMENT;  Surgeon: [name], MD;  Location: [facility];  Service: Urology;  Laterality: Right;   EXTRACORPOREAL SHOCK WAVE LITHOTRIPSY Right 05/16/2019   Procedure: EXTRACORPOREAL SHOCK WAVE LITHOTRIPSY (ESWL);  Surgeon: [name], MD;  Location: [facility];  Service: Urology;  Laterality: Right;   EXTRACORPOREAL SHOCK WAVE LITHOTRIPSY Right 07/18/2019   Procedure: EXTRACORPOREAL SHOCK WAVE LITHOTRIPSY (ESWL);  Surgeon: [name], MD;  Location: [facility];  Service: Urology;  Laterality: Right;   EXTRACORPOREAL SHOCK WAVE LITHOTRIPSY Left 10/24/2019   Procedure: EXTRACORPOREAL SHOCK WAVE LITHOTRIPSY (ESWL);  Surgeon: [name], MD;  Location: [facility];  Service: Urology;  Laterality: Left;   FRACTURE SURGERY Left 2007   "shattered clavicle; broke it in 9 places"  Family History  Problem Relation Age of Onset   Leukemia Mother    Kidney disease Father    Heart attack Maternal Grandfather    Heart attack Paternal Grandfather    Stroke Other    Heart disease Other    Cancer Other     Social History   Socioeconomic History   Marital status: Single    Spouse name: Not on file   Number of children: Not on file   Years of education: Not on file   Highest education level: Not on file  Occupational History   Occupation: Dealer    Comment: [employer]  Tobacco Use   Smoking  status: Never   Smokeless tobacco: Current    Types: Snuff  Vaping Use   Vaping Use: Never used  Substance and Sexual Activity   Alcohol use: Not Currently   Drug use: No   Sexual activity: Not Currently  Other Topics Concern   Not on file  Social History Narrative   Not on file   Social Determinants of Health   Financial Resource Strain: Not on file  Food Insecurity: Not on file  Transportation Needs: Not on file  Physical Activity: Not on file  Stress: Not on file  Social Connections: Not on file  Intimate Partner Violence: Not on file    Review of Systems  Constitutional:  Negative for fatigue.  Respiratory:  Negative for chest tightness and shortness of breath.    Vitals:   05/26/21 1211  BP: 126/78  Pulse: 92  Temp: 98.6 F (37 C)  SpO2: 98%     Physical Exam Constitutional:      Appearance: He is obese.  HENT:     Head: Normocephalic and atraumatic.     Nose: No congestion.     Mouth/Throat:     Mouth: Mucous membranes are moist.  Cardiovascular:     Rate and Rhythm: Normal rate and regular rhythm.     Heart sounds: No murmur heard.   No friction rub. No gallop.  Pulmonary:     Effort: No respiratory distress.     Breath sounds: No stridor. No wheezing or rhonchi.  Musculoskeletal:     Cervical back: No rigidity or tenderness.  Neurological:     Mental Status: He is alert.  Psychiatric:        Mood and Affect: Mood normal.     Data Reviewed: CT scan of the chest was reviewed with the patient showing multiple Pes  Last echocardiogram 04/18/2020, ejection fraction of 65 to 70% Normal right ventricular systolic function, mildly elevated pulmonary artery systolic pressure of 09.3  Assessment:   Pulmonary embolism -Has been on anticoagulation for about a year -Tolerating anticoagulation well -Plan was started on anticoagulation for a year  COVID infection in 2022 -Diagnosis of pulmonary embolism was around the time he had COVID  Morbid  obesity  Remains clinically stable on Xarelto at present  Plan/Recommendations: We will discontinue Xarelto  Obtain blood work for hypercoagulable panel -Orders placed for antiphospholipid antibody, factor V Leiden, protein C and protein S panels, Antithrombin III  Order echocardiogram  I will see him back in about 3 to 4 weeks  Did discuss extensively risks and benefit of discontinuing anticoagulation  Sherrilyn Rist MD  Pulmonary and Critical Care 05/27/2021, 10:14 AM  CC: Barnetta Chapel, NP

## 2021-06-03 ENCOUNTER — Other Ambulatory Visit: Payer: BC Managed Care – PPO

## 2021-06-03 DIAGNOSIS — I2699 Other pulmonary embolism without acute cor pulmonale: Secondary | ICD-10-CM

## 2021-06-05 LAB — ANTITHROMBIN PANEL
AT III AG PPP IMM-ACNC: 78 % (ref 72–124)
AntiThromb III Func: 106 % (ref 75–135)

## 2021-06-05 LAB — LUPUS ANTICOAGULANT PANEL
Dilute Viper Venom Time: 37.9 s (ref 0.0–47.0)
PTT Lupus Anticoagulant: 35.4 s (ref 0.0–43.5)

## 2021-06-14 LAB — ANTIPHOSPHOLIPID SYNDROME DIAGNOSTIC PANEL
Anticardiolipin IgA: <2 APL-U/mL (ref <20.0)
Anticardiolipin IgG: <2 GPL-U/mL (ref <20.0)
Anticardiolipin IgM: <2 MPL-U/mL (ref <20.0)
Beta-2 Glyco 1 IgA: <2 U/mL (ref <20.0)
Beta-2 Glyco 1 IgM: 2.1 U/mL (ref <20.0)
Beta-2 Glyco I IgG: <2 U/mL (ref <20.0)
PTT-LA Screen: 35 s (ref <=40)
dRVVT: 46 s — ABNORMAL HIGH (ref <=45)

## 2021-06-14 LAB — FACTOR 5 LEIDEN: Result: NEGATIVE

## 2021-06-14 LAB — PROTEIN C ACTIVITY: Protein C Activity: 130 % normal (ref 70–180)

## 2021-06-14 LAB — PROTEIN C, TOTAL: Protein C Antigen: 96 % normal (ref 70–140)

## 2021-06-14 LAB — RFLX DRVVT CONFRIM: DRVVT CONFIRM: NEGATIVE

## 2021-06-14 LAB — PROTEIN S, TOTAL: PROTEIN S ANTIGEN, TOTAL: 107 % normal (ref 70–140)

## 2021-06-14 LAB — PROTEIN S ACTIVITY: Protein S Activity: 130 % normal (ref 70–150)

## 2021-06-21 ENCOUNTER — Ambulatory Visit (HOSPITAL_COMMUNITY)
Admission: RE | Admit: 2021-06-21 | Discharge: 2021-06-21 | Disposition: A | Discharge status: Home / Self Care | Payer: BC Managed Care – PPO | Source: Ambulatory Visit | Attending: Pulmonary Disease | Admitting: Pulmonary Disease

## 2021-06-21 ENCOUNTER — Ambulatory Visit: Payer: BC Managed Care – PPO | Admitting: Pulmonary Disease

## 2021-06-21 ENCOUNTER — Encounter: Payer: Self-pay | Admitting: Pulmonary Disease

## 2021-06-21 VITALS — BP 118/76 | HR 85 | Ht 73.0 in | Wt 356.3 lb

## 2021-06-21 DIAGNOSIS — I2609 Other pulmonary embolism with acute cor pulmonale: Secondary | ICD-10-CM

## 2021-06-21 DIAGNOSIS — R0600 Dyspnea, unspecified: Secondary | ICD-10-CM | POA: Insufficient documentation

## 2021-06-21 DIAGNOSIS — E119 Type 2 diabetes mellitus without complications: Secondary | ICD-10-CM | POA: Diagnosis not present

## 2021-06-21 DIAGNOSIS — I2699 Other pulmonary embolism without acute cor pulmonale: Secondary | ICD-10-CM | POA: Insufficient documentation

## 2021-06-21 DIAGNOSIS — R0609 Other forms of dyspnea: Secondary | ICD-10-CM | POA: Diagnosis not present

## 2021-06-21 LAB — ECHOCARDIOGRAM COMPLETE
Area-P 1/2: 3.91 cm2
S' Lateral: 3.5 cm

## 2021-06-21 NOTE — Patient Instructions (Signed)
I will see you back a year from now  Continue graded exercise as tolerated  Increase activity as tolerated  Call with significant concerns  All the testing we did came back negative, you do not have any significant predisposition to having another blood clot  Call us with significant concerns

## 2021-06-21 NOTE — Progress Notes (Signed)
Nicholas [name]    [id number]    June 13, 1970  Primary Care Physician:Inman, Faylene Million, NP  Referring Physician: Barnetta Chapel, NP [address] [place],  [place] [address]  Chief complaint:   Follow-up for pulmonary embolism Has been on blood thinners for about a year Stopped blood thinners about 4 weeks ago  HPI:  Developed pulmonary embolism following COVID diagnosis  Has been doing relatively well  No significant change in activity level  Continues to try to stay active on a regular basis  No worsening shortness of breath No chest pain or chest discomfort  Has been trying to increase his activities, he does get short of breath with activities -He is not limited  Has been on anticoagulation for about 1 year now, the goal was to have him on anticoagulation for about a year  He did have what was felt to be hematuria, evaluated by urology, found to have kidney stones  He remains on Xarelto at present  Stated he had what I see documented in the chart as cellulitis in 2015, there was an associated blood clot at the time as well  No family history of pulm embolic disease  History of thrombophlebitis, diabetes, morbid obesity  Never smoker  Morbid obesity  Outpatient Encounter Medications as of 06/21/2021  Medication Sig   losartan (COZAAR) 25 MG tablet Take 12.5 mg by mouth at bedtime.   metFORMIN (GLUCOPHAGE) 500 MG tablet Take 1,000 mg by mouth 2 (two) times daily.   Meth-Hyo-M Bl-Na Phos-Ph Sal (URIBEL) 118 MG CAPS Take 1 capsule (118 mg total) by mouth every 6 (six) hours as needed.   rivaroxaban (XARELTO) 20 MG TABS tablet Take 20 mg by mouth daily with supper.   RIVAROXABAN (XARELTO) VTE STARTER PACK (15 & 20 MG) Follow package directions: Take one '15mg'$  tablet by mouth twice a day. On day 22, switch to one '20mg'$  tablet once a day. Take with food.   rosuvastatin (CRESTOR) 5 MG tablet Take 5 mg by mouth at bedtime.   RYBELSUS 7 MG TABS Take 7 mg by  mouth every morning.   No facility-administered encounter medications on file as of 06/21/2021.    Allergies as of 06/21/2021 - Review Complete 06/21/2021  Allergen Reaction Noted   Penicillins Other (See Comments)     Past Medical History:  Diagnosis Date   Cancer ([place])    basal cell skin cancer   Cellulitis of right leg 09/17/2013   COVID-19    Diabetes mellitus without complication ([place])    DVT (deep venous thrombosis) ([place])    Dyspnea    due to PE   History of kidney stones    Morbid obesity ([place])    Pulmonary embolism ([place]) 04/2020    Past Surgical History:  Procedure Laterality Date   CLAVICLE HARDWARE REMOVAL Left 2007   CYSTOSCOPY/URETEROSCOPY/HOLMIUM LASER/STENT PLACEMENT Right 12/31/2020   Procedure: CYSTOSCOPY/URETEROSCOPY/HOLMIUM LASER/STENT PLACEMENT;  Surgeon: [name], MD;  Location: [facility];  Service: Urology;  Laterality: Right;   EXTRACORPOREAL SHOCK WAVE LITHOTRIPSY Right 05/16/2019   Procedure: EXTRACORPOREAL SHOCK WAVE LITHOTRIPSY (ESWL);  Surgeon: [name], MD;  Location: [facility];  Service: Urology;  Laterality: Right;   EXTRACORPOREAL SHOCK WAVE LITHOTRIPSY Right 07/18/2019   Procedure: EXTRACORPOREAL SHOCK WAVE LITHOTRIPSY (ESWL);  Surgeon: [name], MD;  Location: [facility];  Service: Urology;  Laterality: Right;   EXTRACORPOREAL SHOCK WAVE LITHOTRIPSY Left 10/24/2019   Procedure: EXTRACORPOREAL SHOCK  WAVE LITHOTRIPSY (ESWL);  Surgeon: [name], MD;  Location: [facility];  Service: Urology;  Laterality: Left;   FRACTURE SURGERY Left 2007   "shattered clavicle; broke it in 9 places"    Family History  Problem Relation Age of Onset   Leukemia Mother    Kidney disease Father    Heart attack Maternal Grandfather    Heart attack Paternal Grandfather    Stroke Other    Heart disease Other    Cancer Other     Social History   Socioeconomic History   Marital status: Single    Spouse name: Not on file   Number of  children: Not on file   Years of education: Not on file   Highest education level: Not on file  Occupational History   Occupation: Dealer    Comment: [employer]  Tobacco Use   Smoking status: Never   Smokeless tobacco: Current    Types: Snuff  Vaping Use   Vaping Use: Never used  Substance and Sexual Activity   Alcohol use: Not Currently   Drug use: No   Sexual activity: Not Currently  Other Topics Concern   Not on file  Social History Narrative   Not on file   Social Determinants of Health   Financial Resource Strain: Not on file  Food Insecurity: Not on file  Transportation Needs: Not on file  Physical Activity: Not on file  Stress: Not on file  Social Connections: Not on file  Intimate Partner Violence: Not on file    Review of Systems  Constitutional:  Negative for fatigue.  Respiratory:  Negative for chest tightness and shortness of breath.     There were no vitals filed for this visit.    Physical Exam Constitutional:      Appearance: He is obese.  HENT:     Head: Normocephalic and atraumatic.     Nose: No congestion.     Mouth/Throat:     Mouth: Mucous membranes are moist.  Cardiovascular:     Rate and Rhythm: Normal rate and regular rhythm.     Heart sounds: No murmur heard.    No friction rub. No gallop.  Pulmonary:     Effort: No respiratory distress.     Breath sounds: No stridor. No wheezing or rhonchi.  Musculoskeletal:     Cervical back: No rigidity or tenderness.  Neurological:     Mental Status: He is alert.  Psychiatric:        Mood and Affect: Mood normal.     Data Reviewed: CT scan of the chest was reviewed with the patient showing multiple Pes  Last echocardiogram 04/18/2020, ejection fraction of 65 to 70% Normal right ventricular systolic function, mildly elevated pulmonary artery systolic pressure of 01.6  Echocardiogram within normal limits with normal ejection fraction, no evidence of pulmonary  hypertension  Assessment:   Pulmonary embolism -Has been on anticoagulation for about a year Stopped anticoagulation about 4 weeks ago -Has been doing relatively well  COVID infection in 2022 -Diagnosis of pulmonary embolism was around the time he had COVID  Morbid obesity  Currently of Xarelto  Plan/Recommendations: Hypercoagulable panel was negative  Echocardiogram with normal right-sided pressures  No significant predisposition to blood clots  I will see him back in a year  Encouraged to call with significant concerns Sherrilyn Rist MD [facility] 06/21/2021, 12:05 PM  CC: Barnetta Chapel, NP

## 2022-01-02 ENCOUNTER — Emergency Department: Payer: BC Managed Care – PPO

## 2022-01-02 ENCOUNTER — Emergency Department
Admission: EM | Admit: 2022-01-02 | Discharge: 2022-01-02 | Disposition: A | Discharge status: Home / Self Care | Payer: BC Managed Care – PPO | Attending: Emergency Medicine | Admitting: Emergency Medicine

## 2022-01-02 ENCOUNTER — Other Ambulatory Visit: Payer: Self-pay

## 2022-01-02 DIAGNOSIS — E119 Type 2 diabetes mellitus without complications: Secondary | ICD-10-CM | POA: Diagnosis not present

## 2022-01-02 DIAGNOSIS — M25512 Pain in left shoulder: Secondary | ICD-10-CM | POA: Diagnosis not present

## 2022-01-02 DIAGNOSIS — S42112A Displaced fracture of body of scapula, left shoulder, initial encounter for closed fracture: Secondary | ICD-10-CM

## 2022-01-02 DIAGNOSIS — S299XXA Unspecified injury of thorax, initial encounter: Secondary | ICD-10-CM | POA: Insufficient documentation

## 2022-01-02 DIAGNOSIS — S4992XA Unspecified injury of left shoulder and upper arm, initial encounter: Secondary | ICD-10-CM | POA: Diagnosis present

## 2022-01-02 MED ORDER — OXYCODONE-ACETAMINOPHEN 5-325 MG PO TABS: 1.0000 | ORAL_TABLET | ORAL | 0 refills | Status: DC | PRN

## 2022-01-02 MED ORDER — ONDANSETRON 4 MG PO TBDP
4.0000 mg | ORAL_TABLET | Freq: Once | ORAL | Status: AC
  Administered 2022-01-02: 4 mg via ORAL
  Filled 2022-01-02: qty 1

## 2022-01-02 MED ORDER — MORPHINE SULFATE (PF) 4 MG/ML IV SOLN
4.0000 mg | Freq: Once | INTRAVENOUS | Status: AC
  Administered 2022-01-02: 4 mg via INTRAMUSCULAR
  Filled 2022-01-02: qty 1
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
## 2022-01-02 NOTE — Discharge Instructions (Addendum)
Please take your pain medication as needed as prescribed.  Do not drink alcohol or drive machinery while using this medication.  Please wear your sling as needed for comfort.  Please call the number provided for orthopedics on Tuesday to arrange a follow-up appointment in approximately 1 week.  Return to the emergency department for any significant worsening pain or any other symptom personally concerning to yourself.

## 2022-04-07 ENCOUNTER — Emergency Department (HOSPITAL_COMMUNITY)
Admission: EM | Admit: 2022-04-07 | Discharge: 2022-04-07 | Disposition: A | Discharge status: Home / Self Care | Payer: BC Managed Care – PPO | Attending: Emergency Medicine | Admitting: Emergency Medicine

## 2022-04-07 ENCOUNTER — Emergency Department (HOSPITAL_COMMUNITY): Payer: BC Managed Care – PPO

## 2022-04-07 ENCOUNTER — Encounter (HOSPITAL_COMMUNITY): Payer: Self-pay

## 2022-04-07 ENCOUNTER — Other Ambulatory Visit: Payer: Self-pay

## 2022-04-07 DIAGNOSIS — Z7901 Long term (current) use of anticoagulants: Secondary | ICD-10-CM | POA: Insufficient documentation

## 2022-04-07 DIAGNOSIS — Z7984 Long term (current) use of oral hypoglycemic drugs: Secondary | ICD-10-CM | POA: Diagnosis not present

## 2022-04-07 DIAGNOSIS — E119 Type 2 diabetes mellitus without complications: Secondary | ICD-10-CM | POA: Diagnosis not present

## 2022-04-07 DIAGNOSIS — R079 Chest pain, unspecified: Secondary | ICD-10-CM

## 2022-04-07 DIAGNOSIS — R0789 Other chest pain: Secondary | ICD-10-CM | POA: Diagnosis not present

## 2022-04-07 LAB — TROPONIN I (HIGH SENSITIVITY)
Troponin I (High Sensitivity): 3 ng/L (ref <18)
Troponin I (High Sensitivity): 3 ng/L (ref <18)

## 2022-04-07 LAB — CBC
HCT: 41 % (ref 39.0–52.0)
Hemoglobin: 13.7 g/dL (ref 13.0–17.0)
MCH: 29.8 pg (ref 26.0–34.0)
MCHC: 33.4 g/dL (ref 30.0–36.0)
MCV: 89.3 fL (ref 80.0–100.0)
Platelets: 224 10*3/uL (ref 150–400)
RBC: 4.59 MIL/uL (ref 4.22–5.81)
RDW: 13.6 % (ref 11.5–15.5)
WBC: 9.1 10*3/uL (ref 4.0–10.5)
nRBC: 0 % (ref 0.0–0.2)

## 2022-04-07 LAB — D-DIMER, QUANTITATIVE: D-Dimer, Quant: 0.27 ug/mL-FEU (ref 0.00–0.50)

## 2022-04-07 LAB — BASIC METABOLIC PANEL
Anion gap: 11 (ref 5–15)
BUN: 11 mg/dL (ref 6–20)
CO2: 24 mmol/L (ref 22–32)
Calcium: 8.9 mg/dL (ref 8.9–10.3)
Chloride: 105 mmol/L (ref 98–111)
Creatinine, Ser: 1.03 mg/dL (ref 0.61–1.24)
GFR, Estimated: >60 mL/min (ref >60)
Glucose, Bld: 160 mg/dL — ABNORMAL HIGH (ref 70–99)
Potassium: 3.8 mmol/L (ref 3.5–5.1)
Sodium: 140 mmol/L (ref 135–145)

## 2022-04-07 NOTE — ED Triage Notes (Signed)
Pt states he has a sharp pain in left side of neck that radiated to left side of chest at 0800 today. Pt went to PCP and had an EKG and it was abnormal and told to come here. Pt states the pain is now int and not as sharp. Pt denies N/V, SOB.

## 2022-04-07 NOTE — Discharge Instructions (Signed)
It was a pleasure taking care of you today.  As discussed, all of your labs are reassuring.  I have placed a referral to cardiology.  Expect a phone call within 1 week to schedule an appointment.  Please return to the ER if you develop further episodes of chest pain.  Return to the ER for new or worsening symptoms.

## 2022-04-07 NOTE — ED Provider Notes (Signed)
[facility] Provider Note   CSN: [id number] Arrival date & time: 04/07/22  1312     History  Chief Complaint  Patient presents with   Chest Pain    Nicholas [name] is a 52 y.o. 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  Patient states he was walking into work and developed left-sided pain.  Patient states pain started in the left side of his neck and radiated to below left breast.  No shortness of breath, nausea, vomiting, or diaphoresis with chest pain.  Patient states chest pain was so severe it caused him to stop what he was doing.  Patient then was evaluated by EMS with a reassuring EKG and advised to follow-up with PCP.  Patient then saw PCP and another EKG was performed which was abnormal per MD.  Patient was given ASA 324 prior to arrival.  Patient notes that his chest pain has significantly improved.  He admits to a pressure-like sensation to left breast.  Denies lower extremity edema.  Patient is a Dealer and Administrator.  He notes he recently drove to [employer].   History obtained from patient and past medical records. No interpreter used during encounter.       Home Medications Prior to Admission medications   Medication Sig Start Date End Date Taking? Authorizing Provider  losartan (COZAAR) 25 MG tablet Take 12.5 mg by mouth at bedtime. 08/28/20   [provider]  metFORMIN (GLUCOPHAGE) 500 MG tablet Take 1,000 mg by mouth 2 (two) times daily. 02/05/20   [provider]  Meth-Hyo-M Bl-Na Phos-Ph Sal (URIBEL) 118 MG CAPS Take 1 capsule (118 mg total) by mouth every 6 (six) hours as needed. 12/31/20   [name], MD  oxyCODONE-acetaminophen (PERCOCET) 5-325 MG tablet Take 1 tablet by mouth every 4 (four) hours  as needed for severe pain. 01/02/22   Harvest Dark, MD  rivaroxaban (XARELTO) 20 MG TABS tablet Take 20 mg by mouth daily with supper.    [provider]  rosuvastatin (CRESTOR) 5 MG tablet Take 5 mg by mouth at bedtime. 08/28/20   [provider]  RYBELSUS 7 MG TABS Take 7 mg by mouth every morning. 04/16/20   [provider]      Allergies    Penicillins    Review of Systems   Review of Systems  Constitutional:  Negative for chills and fever.  Respiratory:  Negative for shortness of breath.   Cardiovascular:  Positive for chest pain. Negative for leg swelling.    Physical Exam Updated Vital Signs BP 126/80   Pulse 70   Temp 97.6 F (36.4 C) (Oral)   Resp 15   Ht 6\' 1"  (1.854 m)   Wt (!) 161.5 kg   SpO2 95%   BMI 46.97 kg/m  Physical Exam Vitals and nursing note reviewed.  Constitutional:      General: He is not in acute distress.    Appearance: He is not ill-appearing.  HENT:     Head: Normocephalic.  Eyes:     Pupils: Pupils are equal, round, and reactive to light.  Cardiovascular:     Rate and Rhythm: Normal rate and regular rhythm.     Pulses: Normal pulses.     Heart sounds: Normal heart sounds.  No murmur heard.    No friction rub. No gallop.  Pulmonary:     Effort: Pulmonary effort is normal.     Breath sounds: Normal breath sounds.  Abdominal:     General: Abdomen is flat. There is no distension.     Palpations: Abdomen is soft.     Tenderness: There is no abdominal tenderness. There is no guarding or rebound.  Musculoskeletal:        General: Normal range of motion.     Cervical back: Neck supple.     Comments: No lower extremity edema.  Skin:    General: Skin is warm and dry.  Neurological:     General: No focal deficit present.     Mental Status: He is alert.  Psychiatric:        Mood and Affect: Mood normal.        Behavior: Behavior normal.          ED Results / Procedures / Treatments   Labs (all labs  ordered are listed, but only abnormal results are displayed) Labs Reviewed  BASIC METABOLIC PANEL - Abnormal; Notable for the following components:      Result Value   Glucose, Bld 160 (*)    All other components within normal limits  CBC  D-DIMER, QUANTITATIVE  TROPONIN I (HIGH SENSITIVITY)  TROPONIN I (HIGH SENSITIVITY)    EKG EKG Interpretation  Date/Time:  Thursday April 07 2022 13:44:47 EDT Ventricular Rate:  86 PR Interval:  213 QRS Duration: 99 QT Interval:  365 QTC Calculation: 437 R Axis:   -37 Text Interpretation: Sinus rhythm Prolonged PR interval Left axis deviation Low voltage, precordial leads Abnormal R-wave progression, early transition No significant change since last tracing Confirmed by Wandra Arthurs [phone]) on 04/07/2022 4:12:52 PM  Radiology DG Chest 2 View  Result Date: 04/07/2022 CLINICAL DATA:  Chest pain EXAM: CHEST - 2 VIEW COMPARISON:  04/17/2020 and older.  CT 01/02/2022 and older FINDINGS: No pneumothorax, effusion or edema. Normal cardiopericardial silhouette. There is some linear opacity lung bases likely scar or atelectasis. Dense nodule left lung base consistent with calcified nodule on prior examination. Old granulomatous disease. Old left-sided rib fractures. Overlapping cardiac leads. IMPRESSION: Slight basilar atelectasis.  No acute cardiopulmonary disease Electronically Signed   By: Jill Side M.D.   On: 04/07/2022 14:33    Procedures Procedures    Medications Ordered in ED Medications - No data to display  ED Course/ Medical Decision Making/ A&P Clinical Course as of 04/07/22 2246  Thu Apr 07, 2022  1718 Reassessed patient, patient currently chest pain free. No further episodes of chest pain while here in the ED.  [CA]    Clinical Course User Index [CA] Suzy Bouchard, PA-C                             Medical Decision Making Amount and/or Complexity of Data Reviewed Independent Historian: EMS External Data Reviewed:  ECG. Labs: ordered. Decision-making details documented in ED Course. Radiology: ordered and independent interpretation performed. Decision-making details documented in ED Course. ECG/medicine tests: ordered and independent interpretation performed. Decision-making details documented in ED Course.   This patient presents to the ED for concern of chest pain, this involves an extensive number of treatment options, and is a complaint that carries with it a high risk of complications and morbidity.  The differential diagnosis includes ACS, PE, dissection, PNA, MSK, etc  52 year old  male presents to the ED due to sudden onset of left-sided chest pain that started earlier this morning while walking into work.  History of PE/DVT thought to be secondary to COVID-19.  Not currently on any anticoagulants.  No shortness of breath, nausea, vomiting, diaphoresis.  No history of CAD.  Not followed by cardiology.  Patient is a truck Holiday representative.  Recently traveled to [employer].  Upon arrival, patient is afebrile, not tachycardic or hypoxic.  Patient in no acute distress.  Some tenderness to left-sided chest wall without crepitus or deformity.  No lower extremity edema. Cardiac labs ordered. D-dimer to rule out PE given history. EKGs from EMS and PCP office are above with no evidence of ischemia.  CBC unremarkable.  No leukocytosis.  Normal hemoglobin.  BMP reassuring.  Normal renal function.  No major electrolyte derangements.  Hyperglycemia 160.  No anion gap.  D-dimer normal.  Doubt PE.  Troponin negative x 2.  EKG normal sinus rhythm.  No signs of acute ischemia.  Chest x-ray personally reviewed and interpreted which is negative for signs of pneumonia, pneumothorax or widened mediastinum.  Presentation nonconcerning for dissection.  Discussed with Dr. Jearld Pies with cardiology who reviewed previous EKGs, no concerning features.  Recommends outpatient follow-up and return if chest pain recurs.   Patient stable  for discharge.  Patient is currently chest pain-free.  Cardiology referral placed. Strict ED precautions discussed with patient. Patient states understanding and agrees to plan. Patient discharged home in no acute distress and stable vitals  Has PCP       Final Clinical Impression(s) / ED Diagnoses Final diagnoses:  Nonspecific chest pain    Rx / DC Orders ED Discharge Orders          Ordered    Ambulatory referral to Cardiology       Comments: If you have not heard from the Cardiology office within the next 72 hours please call [phone].   04/07/22 1751              Suzy Bouchard, PA-C 04/07/22 2248    Dorie Rank, MD 04/08/22 [phone]

## 2022-04-15 DIAGNOSIS — Z6841 Body Mass Index (BMI) 40.0 and over, adult: Secondary | ICD-10-CM | POA: Insufficient documentation

## 2022-04-15 DIAGNOSIS — E559 Vitamin D deficiency, unspecified: Secondary | ICD-10-CM

## 2022-04-26 ENCOUNTER — Encounter: Payer: Self-pay | Admitting: *Deleted

## 2022-04-26 ENCOUNTER — Ambulatory Visit: Payer: BC Managed Care – PPO | Attending: Interventional Cardiology | Admitting: Interventional Cardiology

## 2022-04-26 ENCOUNTER — Encounter: Payer: Self-pay | Admitting: Interventional Cardiology

## 2022-04-26 VITALS — BP 98/70 | HR 88 | Ht 73.0 in | Wt 360.0 lb

## 2022-04-26 DIAGNOSIS — R0789 Other chest pain: Secondary | ICD-10-CM | POA: Diagnosis not present

## 2022-04-26 DIAGNOSIS — E1159 Type 2 diabetes mellitus with other circulatory complications: Secondary | ICD-10-CM | POA: Diagnosis not present

## 2022-04-26 DIAGNOSIS — I10 Essential (primary) hypertension: Secondary | ICD-10-CM | POA: Diagnosis not present

## 2022-04-26 NOTE — Patient Instructions (Signed)
Medication Instructions:  Your physician recommends that you continue on your current medications as directed. Please refer to the Current Medication list given to you today.  *If you need a refill on your cardiac medications before your next appointment, please call your pharmacy*   Lab Work: none If you have labs (blood work) drawn today and your tests are completely normal, you will receive your results only by: MyChart Message (if you have MyChart) OR A paper copy in the mail If you have any lab test that is abnormal or we need to change your treatment, we will call you to review the results.   Testing/Procedures: none   Follow-Up: At [facility], you and your health needs are our priority.  As part of our continuing mission to provide you with exceptional heart care, we have created designated Provider Care Teams.  These Care Teams include your primary Cardiologist (physician) and Advanced Practice Providers (APPs -  Physician Assistants and Nurse Practitioners) who all work together to provide you with the care you need, when you need it.  We recommend signing up for the patient portal called "MyChart".  Sign up information is provided on this After Visit Summary.  MyChart is used to connect with patients for Virtual Visits (Telemedicine).  Patients are able to view lab/test results, encounter notes, upcoming appointments, etc.  Non-urgent messages can be sent to your provider as well.   To learn more about what you can do with MyChart, go to [email or web address].    Your next appointment:   As needed  Provider:   Lance Muss, MD     Other Instructions Please contact office if you would like to proceed with testing

## 2022-04-26 NOTE — Progress Notes (Signed)
Cardiology Office Note   Date:  04/26/2022   ID:  Nicholas [name], DOB [birth year], MRN [id number]  PCP:  Alveria Apley, NP (Inactive)    No chief complaint on file.  Chest pain  Wt Readings from Last 3 Encounters:  04/26/22 (!) 360 lb (163.3 kg)  04/07/22 (!) 356 lb 0.7 oz (161.5 kg)  01/02/22 (!) 356 lb (161.5 kg)       History of Present Illness: Nicholas [name] is a 52 y.o. male who is being seen today for the evaluation of chest pain at the request of Marcellus Scott, MD.   2023 echo: "Left ventricular ejection fraction, by estimation, is 55 to 60%. The  left ventricle has normal function. The left ventricle has no regional  wall motion abnormalities. Left ventricular diastolic parameters were  normal.   2. Right ventricular systolic function is normal. The right ventricular  size is normal.   3. The mitral valve is normal in structure. No evidence of mitral valve  regurgitation. No evidence of mitral stenosis.   4. The aortic valve is tricuspid. Aortic valve regurgitation is not  visualized. No aortic stenosis is present.   5. The inferior vena cava is normal in size with greater than 50%  respiratory variability, suggesting right atrial pressure of 3 mmHg. "  He works as a Curator and drives a truck.  Walked in to work and had a sudden pain from the left neck to the left chest.  Sharp pain lasted 15-20 minutes.  Not related to deep breathing.  He has had a residual pressure on the left side of the chest, not related to exertion.  Had a broken left scapula in 12/23. [place] surgery in 2007 on the left.   Father with CHF.   Was on Xarelto for DVT/PE in 2022.  Stopped after 1 year.  Denies : exertional Chest pain. Dizziness. Leg edema. Nitroglycerin use. Orthopnea. Palpitations. Paroxysmal nocturnal dyspnea.  Syncope.      Past Medical History:  Diagnosis Date   Cancer    basal cell skin cancer   Cellulitis of right leg 09/17/2013   COVID-19     Diabetes mellitus without complication    DVT (deep venous thrombosis)    Dyspnea    due to PE   History of kidney stones    Morbid obesity    Pulmonary embolism 04/2020    Past Surgical History:  Procedure Laterality Date   CLAVICLE HARDWARE REMOVAL Left 2007   CYSTOSCOPY/URETEROSCOPY/HOLMIUM LASER/STENT PLACEMENT Right 12/31/2020   Procedure: CYSTOSCOPY/URETEROSCOPY/HOLMIUM LASER/STENT PLACEMENT;  Surgeon: Orson Ape, MD;  Location: [facility] ORS;  Service: Urology;  Laterality: Right;   EXTRACORPOREAL SHOCK WAVE LITHOTRIPSY Right 05/16/2019   Procedure: EXTRACORPOREAL SHOCK WAVE LITHOTRIPSY (ESWL);  Surgeon: Orson Ape, MD;  Location: [facility] ORS;  Service: Urology;  Laterality: Right;   EXTRACORPOREAL SHOCK WAVE LITHOTRIPSY Right 07/18/2019   Procedure: EXTRACORPOREAL SHOCK WAVE LITHOTRIPSY (ESWL);  Surgeon: Orson Ape, MD;  Location: [facility] ORS;  Service: Urology;  Laterality: Right;   EXTRACORPOREAL SHOCK WAVE LITHOTRIPSY Left 10/24/2019   Procedure: EXTRACORPOREAL SHOCK WAVE LITHOTRIPSY (ESWL);  Surgeon: Orson Ape, MD;  Location: [facility] ORS;  Service: Urology;  Laterality: Left;   FRACTURE SURGERY Left 2007   "shattered clavicle; broke it in 9 places"     Current Outpatient Medications  Medication Sig Dispense Refill   Cholecalciferol (VITAMIN D-3) 25 MCG (1000 UT) CAPS Take 1 capsule by mouth daily at 6 (six) AM.  losartan (COZAAR) 25 MG tablet Take 12.5 mg by mouth at bedtime.     metFORMIN (GLUCOPHAGE) 500 MG tablet Take 1,000 mg by mouth 2 (two) times daily.     rosuvastatin (CRESTOR) 5 MG tablet Take 5 mg by mouth at bedtime.     RYBELSUS 7 MG TABS Take 7 mg by mouth every morning.     meloxicam (MOBIC) 7.5 MG tablet Take 7.5 mg by mouth 2 (two) times daily. (Patient not taking: Reported on 04/26/2022)     Meth-Hyo-M Bl-Na Phos-Ph Sal (URIBEL) 118 MG CAPS Take 1 capsule (118 mg total) by mouth every 6 (six) hours as needed. (Patient not taking: Reported on  04/26/2022) 40 capsule 3   oxyCODONE-acetaminophen (PERCOCET) 5-325 MG tablet Take 1 tablet by mouth every 4 (four) hours as needed for severe pain. (Patient not taking: Reported on 04/26/2022) 20 tablet 0   rivaroxaban (XARELTO) 20 MG TABS tablet Take 20 mg by mouth daily with supper. (Patient not taking: Reported on 04/26/2022)     No current facility-administered medications for this visit.    Allergies:   Lisinopril, Amoxicillin, and Penicillins    Social History:  The patient  reports that he has never smoked. He has quit using smokeless tobacco.  His smokeless tobacco use included snuff. He reports that he does not currently use alcohol. He reports that he does not use drugs.   Family History:  [name] family history includes Cancer in an other family member; Heart attack in his maternal grandfather and paternal grandfather; Heart disease in an other family member; Kidney disease in his father; Leukemia in his mother; Stroke in an other family member.    ROS:  Please see the history of present illness.   Otherwise, review of systems are positive for DOE.   All other systems are reviewed and negative.    PHYSICAL EXAM: VS:  BP 98/70   Pulse 88   Ht  (1.854 m)   Wt (!) 360 lb (163.3 kg)   SpO2 97%   BMI 47.50 kg/m  , BMI Body mass index is 47.5 kg/m. GEN: Well nourished, well developed, in no acute distress HEENT: normal Neck: no JVD, carotid bruits, or masses Cardiac: RRR; no murmurs, rubs, or gallops,no edema  Respiratory:  clear to auscultation bilaterally, normal work of breathing GI: soft, nontender, nondistended, + BS, obese MS: no deformity or atrophy Skin: warm and dry, no rash Neuro:  Strength and sensation are intact Psych: euthymic mood, full affect   EKG:   The ekg ordered 3/24 demonstrates NSR, no ST changes   Recent Labs: 04/07/2022: BUN 11; Creatinine, Ser 1.03; Hemoglobin 13.7; Platelets 224; Potassium 3.8; Sodium 140   Lipid Panel No results  found for: "CHOL", "TRIG", "HDL", "CHOLHDL", "VLDL", "LDLCALC", "LDLDIRECT"   Other studies Reviewed: Additional studies/ records that were reviewed today with results demonstrating: .   ASSESSMENT AND PLAN:  Chest pain: Several atypical features.  We talked about further evaluation with a CTA coronaries.  He is generally frustrated with the healthcare system.  He has to pay a co-pay for all of his visits.  He feels like he has been sent to multiple different doctors.  We went over the results of his current testing.  His troponins were negative.  His ECG does not show any acute changes.  There are no high risk features and I explained to him that is why he was not admitted.  He prefers to wait and see whether or  not symptoms worsen before doing a CTA.  Symptoms have gotten better from that day in March.  He will let us know if he does decide on having the CTA. Diabetes: He states his last A1c was less than 7.  Continue current medicines.  Whole food, plant-based diet.  High-fiber diet.  Avoid processed foods.  I talked about decreasing meat consumption at length with him.  We talked about avoiding processed sugar. Morbid obesity: Does admit to enjoying greens and vegetables.  We talked about portion control of on healthier foods.  His job is physical.  He does not want to do other exercise outside of work.  He has no intentions of joining a gym.  We talked about weight loss to help reduce the need for medications and to lower his risk of health problems in the future. Hypertension: Well-controlled on losartan. Prior PE: He has completed 1 year of anticoagulation.   Current medicines are reviewed at length with the patient today.  The patient concerns regarding his medicines were addressed.  The following changes have been made:  No change  Labs/ tests ordered today include:  No orders of the defined types were placed in this encounter.   Recommend 150 minutes/week of aerobic exercise Low  fat, low carb, high fiber diet recommended  Disposition:   FU as needed   Signed, Lance Muss, MD  04/26/2022 3:20 PM    [facility] [address] [place], [place], [place]  [address] Phone: [phone]; Fax: [phone]

## 2022-07-11 HISTORY — PX: ARTHROSCOPY, SHOULDER, WITH SUPERIOR CAPSULE RECONSTRUCTION: SHX7162

## 2023-01-29 ENCOUNTER — Emergency Department (HOSPITAL_COMMUNITY): Payer: 59

## 2023-01-29 ENCOUNTER — Other Ambulatory Visit: Payer: Self-pay

## 2023-01-29 ENCOUNTER — Inpatient Hospital Stay (HOSPITAL_BASED_OUTPATIENT_CLINIC_OR_DEPARTMENT_OTHER): Payer: 59

## 2023-01-29 ENCOUNTER — Encounter (HOSPITAL_COMMUNITY): Payer: Self-pay

## 2023-01-29 ENCOUNTER — Inpatient Hospital Stay (HOSPITAL_COMMUNITY)
Admission: EM | Admit: 2023-01-29 | Discharge: 2023-02-02 | DRG: 173 | Disposition: A | Discharge status: Home / Self Care | Payer: 59 | Attending: Internal Medicine | Admitting: Internal Medicine

## 2023-01-29 DIAGNOSIS — Z8616 Personal history of COVID-19: Secondary | ICD-10-CM | POA: Diagnosis not present

## 2023-01-29 DIAGNOSIS — N132 Hydronephrosis with renal and ureteral calculous obstruction: Secondary | ICD-10-CM | POA: Diagnosis present

## 2023-01-29 DIAGNOSIS — Z86718 Personal history of other venous thrombosis and embolism: Secondary | ICD-10-CM

## 2023-01-29 DIAGNOSIS — Z79899 Other long term (current) drug therapy: Secondary | ICD-10-CM | POA: Diagnosis not present

## 2023-01-29 DIAGNOSIS — Z1152 Encounter for screening for COVID-19: Secondary | ICD-10-CM

## 2023-01-29 DIAGNOSIS — Z87891 Personal history of nicotine dependence: Secondary | ICD-10-CM | POA: Diagnosis not present

## 2023-01-29 DIAGNOSIS — Z888 Allergy status to other drugs, medicaments and biological substances status: Secondary | ICD-10-CM

## 2023-01-29 DIAGNOSIS — Z6841 Body Mass Index (BMI) 40.0 and over, adult: Secondary | ICD-10-CM

## 2023-01-29 DIAGNOSIS — I2699 Other pulmonary embolism without acute cor pulmonale: Secondary | ICD-10-CM | POA: Diagnosis present

## 2023-01-29 DIAGNOSIS — Z85828 Personal history of other malignant neoplasm of skin: Secondary | ICD-10-CM | POA: Diagnosis not present

## 2023-01-29 DIAGNOSIS — Z880 Allergy status to penicillin: Secondary | ICD-10-CM

## 2023-01-29 DIAGNOSIS — Z91128 Patient's intentional underdosing of medication regimen for other reason: Secondary | ICD-10-CM | POA: Diagnosis not present

## 2023-01-29 DIAGNOSIS — D696 Thrombocytopenia, unspecified: Secondary | ICD-10-CM | POA: Diagnosis present

## 2023-01-29 DIAGNOSIS — E119 Type 2 diabetes mellitus without complications: Secondary | ICD-10-CM | POA: Diagnosis present

## 2023-01-29 DIAGNOSIS — I2692 Saddle embolus of pulmonary artery without acute cor pulmonale: Secondary | ICD-10-CM | POA: Diagnosis not present

## 2023-01-29 DIAGNOSIS — E8721 Acute metabolic acidosis: Secondary | ICD-10-CM | POA: Diagnosis present

## 2023-01-29 DIAGNOSIS — I2609 Other pulmonary embolism with acute cor pulmonale: Secondary | ICD-10-CM | POA: Diagnosis not present

## 2023-01-29 DIAGNOSIS — I82431 Acute embolism and thrombosis of right popliteal vein: Secondary | ICD-10-CM | POA: Diagnosis present

## 2023-01-29 DIAGNOSIS — Z7984 Long term (current) use of oral hypoglycemic drugs: Secondary | ICD-10-CM | POA: Diagnosis not present

## 2023-01-29 DIAGNOSIS — E785 Hyperlipidemia, unspecified: Secondary | ICD-10-CM | POA: Diagnosis present

## 2023-01-29 DIAGNOSIS — I10 Essential (primary) hypertension: Secondary | ICD-10-CM | POA: Diagnosis present

## 2023-01-29 DIAGNOSIS — I2602 Saddle embolus of pulmonary artery with acute cor pulmonale: Principal | ICD-10-CM | POA: Diagnosis present

## 2023-01-29 DIAGNOSIS — Z881 Allergy status to other antibiotic agents status: Secondary | ICD-10-CM

## 2023-01-29 DIAGNOSIS — Z8249 Family history of ischemic heart disease and other diseases of the circulatory system: Secondary | ICD-10-CM

## 2023-01-29 DIAGNOSIS — Z86711 Personal history of pulmonary embolism: Secondary | ICD-10-CM | POA: Diagnosis not present

## 2023-01-29 DIAGNOSIS — Z7901 Long term (current) use of anticoagulants: Secondary | ICD-10-CM

## 2023-01-29 HISTORY — PX: IR US GUIDE VASC ACCESS RIGHT: IMG2390

## 2023-01-29 HISTORY — PX: IR INFUSION THROMBOL ARTERIAL INITIAL (MS): IMG5376

## 2023-01-29 HISTORY — PX: IR ANGIOGRAM SELECTIVE EACH ADDITIONAL VESSEL: IMG667

## 2023-01-29 HISTORY — PX: IR ANGIOGRAM PULMONARY BILATERAL SELECTIVE: IMG664

## 2023-01-29 LAB — CBC
HCT: 45.7 % (ref 39.0–52.0)
Hemoglobin: 15.6 g/dL (ref 13.0–17.0)
MCH: 30.6 pg (ref 26.0–34.0)
MCHC: 34.1 g/dL (ref 30.0–36.0)
MCV: 89.8 fL (ref 80.0–100.0)
Platelets: 208 10*3/uL (ref 150–400)
RBC: 5.09 MIL/uL (ref 4.22–5.81)
RDW: 13.6 % (ref 11.5–15.5)
WBC: 20 10*3/uL — ABNORMAL HIGH (ref 4.0–10.5)
nRBC: 0 % (ref 0.0–0.2)

## 2023-01-29 LAB — I-STAT VENOUS BLOOD GAS, ED
Acid-Base Excess: 0 mmol/L (ref 0.0–2.0)
Bicarbonate: 24.7 mmol/L (ref 20.0–28.0)
Calcium, Ion: 1.12 mmol/L — ABNORMAL LOW (ref 1.15–1.40)
HCT: 50 % (ref 39.0–52.0)
Hemoglobin: 17 g/dL (ref 13.0–17.0)
O2 Saturation: 48 %
Potassium: 4.1 mmol/L (ref 3.5–5.1)
Sodium: 142 mmol/L (ref 135–145)
TCO2: 26 mmol/L (ref 22–32)
pCO2, Ven: 41 mm[Hg] — ABNORMAL LOW (ref 44–60)
pH, Ven: 7.387 (ref 7.25–7.43)
pO2, Ven: 26 mm[Hg] — CL (ref 32–45)

## 2023-01-29 LAB — CBC WITH DIFFERENTIAL/PLATELET
Abs Immature Granulocytes: 0.22 10*3/uL — ABNORMAL HIGH (ref 0.00–0.07)
Basophils Absolute: 0.1 10*3/uL (ref 0.0–0.1)
Basophils Relative: 0 %
Eosinophils Absolute: 0.1 10*3/uL (ref 0.0–0.5)
Eosinophils Relative: 1 %
HCT: 50.2 % (ref 39.0–52.0)
Hemoglobin: 16.9 g/dL (ref 13.0–17.0)
Immature Granulocytes: 1 %
Lymphocytes Relative: 11 %
Lymphs Abs: 2.5 10*3/uL (ref 0.7–4.0)
MCH: 31 pg (ref 26.0–34.0)
MCHC: 33.7 g/dL (ref 30.0–36.0)
MCV: 92.1 fL (ref 80.0–100.0)
Monocytes Absolute: 1.4 10*3/uL — ABNORMAL HIGH (ref 0.1–1.0)
Monocytes Relative: 6 %
Neutro Abs: 18 10*3/uL — ABNORMAL HIGH (ref 1.7–7.7)
Neutrophils Relative %: 81 %
Platelets: 238 10*3/uL (ref 150–400)
RBC: 5.45 MIL/uL (ref 4.22–5.81)
RDW: 13.7 % (ref 11.5–15.5)
WBC: 22.2 10*3/uL — ABNORMAL HIGH (ref 4.0–10.5)
nRBC: 0 % (ref 0.0–0.2)

## 2023-01-29 LAB — PROTIME-INR
INR: 1.3 — ABNORMAL HIGH (ref 0.8–1.2)
Prothrombin Time: 16 s — ABNORMAL HIGH (ref 11.4–15.2)

## 2023-01-29 LAB — TROPONIN I (HIGH SENSITIVITY)
Troponin I (High Sensitivity): 326 ng/L (ref <18)
Troponin I (High Sensitivity): 467 ng/L (ref <18)
Troponin I (High Sensitivity): 691 ng/L (ref <18)
Troponin I (High Sensitivity): 963 ng/L (ref <18)

## 2023-01-29 LAB — COMPREHENSIVE METABOLIC PANEL
ALT: 51 U/L — ABNORMAL HIGH (ref 0–44)
AST: 53 U/L — ABNORMAL HIGH (ref 15–41)
Albumin: 3.7 g/dL (ref 3.5–5.0)
Alkaline Phosphatase: 75 U/L (ref 38–126)
Anion gap: 13 (ref 5–15)
BUN: 11 mg/dL (ref 6–20)
CO2: 22 mmol/L (ref 22–32)
Calcium: 9.8 mg/dL (ref 8.9–10.3)
Chloride: 103 mmol/L (ref 98–111)
Creatinine, Ser: 1.19 mg/dL (ref 0.61–1.24)
GFR, Estimated: >60 mL/min (ref >60)
Glucose, Bld: 173 mg/dL — ABNORMAL HIGH (ref 70–99)
Potassium: 4.1 mmol/L (ref 3.5–5.1)
Sodium: 138 mmol/L (ref 135–145)
Total Bilirubin: 1 mg/dL (ref 0.0–1.2)
Total Protein: 7.7 g/dL (ref 6.5–8.1)

## 2023-01-29 LAB — RESP PANEL BY RT-PCR (RSV, FLU A&B, COVID)  RVPGX2
Influenza A by PCR: NEGATIVE
Influenza B by PCR: NEGATIVE
Resp Syncytial Virus by PCR: NEGATIVE
SARS Coronavirus 2 by RT PCR: NEGATIVE

## 2023-01-29 LAB — BRAIN NATRIURETIC PEPTIDE: B Natriuretic Peptide: 29.9 pg/mL (ref 0.0–100.0)

## 2023-01-29 LAB — I-STAT CHEM 8, ED
BUN: 15 mg/dL (ref 6–20)
Calcium, Ion: 1.15 mmol/L (ref 1.15–1.40)
Chloride: 106 mmol/L (ref 98–111)
Creatinine, Ser: 1.1 mg/dL (ref 0.61–1.24)
Glucose, Bld: 169 mg/dL — ABNORMAL HIGH (ref 70–99)
HCT: 51 % (ref 39.0–52.0)
Hemoglobin: 17.3 g/dL — ABNORMAL HIGH (ref 13.0–17.0)
Potassium: 4.2 mmol/L (ref 3.5–5.1)
Sodium: 141 mmol/L (ref 135–145)
TCO2: 27 mmol/L (ref 22–32)

## 2023-01-29 LAB — HEPARIN LEVEL (UNFRACTIONATED): Heparin Unfractionated: 0.71 [IU]/mL — ABNORMAL HIGH (ref 0.30–0.70)

## 2023-01-29 LAB — I-STAT CG4 LACTIC ACID, ED
Lactic Acid, Venous: 2.9 mmol/L (ref 0.5–1.9)
Lactic Acid, Venous: 1.9 mmol/L (ref 0.5–1.9)

## 2023-01-29 LAB — GLUCOSE, CAPILLARY
Glucose-Capillary: 117 mg/dL — ABNORMAL HIGH (ref 70–99)
Glucose-Capillary: 119 mg/dL — ABNORMAL HIGH (ref 70–99)

## 2023-01-29 LAB — HEMOGLOBIN A1C
Hgb A1c MFr Bld: 6.6 % — ABNORMAL HIGH (ref 4.8–5.6)
Mean Plasma Glucose: 142.72 mg/dL

## 2023-01-29 LAB — FIBRINOGEN: Fibrinogen: 392 mg/dL (ref 210–475)

## 2023-01-29 LAB — D-DIMER, QUANTITATIVE: D-Dimer, Quant: >20 ug{FEU}/mL — ABNORMAL HIGH (ref 0.00–0.50)

## 2023-01-29 LAB — MRSA NEXT GEN BY PCR, NASAL: MRSA by PCR Next Gen: NOT DETECTED

## 2023-01-29 LAB — APTT: aPTT: 108 s — ABNORMAL HIGH (ref 24–36)

## 2023-01-29 LAB — HIV ANTIBODY (ROUTINE TESTING W REFLEX): HIV Screen 4th Generation wRfx: NONREACTIVE

## 2023-01-29 MED ORDER — SODIUM CHLORIDE 0.9 % IV SOLN
12.0000 mg | Freq: Once | INTRAVENOUS | Status: AC
  Administered 2023-01-29: 12 mg via INTRAVENOUS
  Filled 2023-01-29: qty 12

## 2023-01-29 MED ORDER — SODIUM CHLORIDE 0.9% FLUSH: 3.0000 mL | INTRAVENOUS | Status: DC | PRN

## 2023-01-29 MED ORDER — IOHEXOL 350 MG/ML SOLN
75.0000 mL | Freq: Once | INTRAVENOUS | Status: AC | PRN
  Administered 2023-01-29: 75 mL via INTRAVENOUS

## 2023-01-29 MED ORDER — FENTANYL CITRATE (PF) 100 MCG/2ML IJ SOLN
INTRAMUSCULAR | Status: AC | PRN
  Administered 2023-01-29: 50 ug via INTRAVENOUS

## 2023-01-29 MED ORDER — MIDAZOLAM HCL 2 MG/2ML IJ SOLN
INTRAMUSCULAR | Status: AC | PRN
  Administered 2023-01-29: 1 mg via INTRAVENOUS

## 2023-01-29 MED ORDER — POLYETHYLENE GLYCOL 3350 17 G PO PACK: 17.0000 g | PACK | Freq: Every day | ORAL | Status: DC | PRN

## 2023-01-29 MED ORDER — SODIUM CHLORIDE 0.9 % IV BOLUS
1000.0000 mL | Freq: Once | INTRAVENOUS | Status: AC
  Administered 2023-01-29: 1000 mL via INTRAVENOUS

## 2023-01-29 MED ORDER — IOHEXOL 300 MG/ML  SOLN
100.0000 mL | Freq: Once | INTRAMUSCULAR | Status: AC | PRN
  Administered 2023-01-29: 40 mL via INTRAVENOUS

## 2023-01-29 MED ORDER — SODIUM CHLORIDE 0.9 % IV SOLN
250.0000 mL | INTRAVENOUS | Status: AC | PRN
  Administered 2023-01-29 (×2): 250 mL via INTRAVENOUS

## 2023-01-29 MED ORDER — LIDOCAINE HCL 1 % IJ SOLN
20.0000 mL | Freq: Once | INTRAMUSCULAR | Status: AC
  Administered 2023-01-29: 10 mL

## 2023-01-29 MED ORDER — ACETAMINOPHEN 325 MG PO TABS
650.0000 mg | ORAL_TABLET | Freq: Four times a day (QID) | ORAL | Status: DC | PRN
  Administered 2023-01-30 – 2023-01-31 (×2): 650 mg via ORAL
  Filled 2023-01-29 (×2): qty 2

## 2023-01-29 MED ORDER — MIDAZOLAM HCL 2 MG/2ML IJ SOLN
INTRAMUSCULAR | Status: AC
  Filled 2023-01-29: qty 2

## 2023-01-29 MED ORDER — IOHEXOL 300 MG/ML  SOLN
100.0000 mL | Freq: Once | INTRAMUSCULAR | Status: AC | PRN
  Administered 2023-01-29: 30 mL via INTRAVENOUS

## 2023-01-29 MED ORDER — HEPARIN BOLUS VIA INFUSION
6500.0000 [IU] | Freq: Once | INTRAVENOUS | Status: AC
  Administered 2023-01-29: 6500 [IU] via INTRAVENOUS
  Filled 2023-01-29: qty 6500

## 2023-01-29 MED ORDER — FENTANYL CITRATE (PF) 100 MCG/2ML IJ SOLN
INTRAMUSCULAR | Status: AC
  Filled 2023-01-29: qty 2

## 2023-01-29 MED ORDER — CHLORHEXIDINE GLUCONATE CLOTH 2 % EX PADS
6.0000 | MEDICATED_PAD | Freq: Every day | CUTANEOUS | Status: DC
  Administered 2023-01-29 – 2023-02-02 (×5): 6 via TOPICAL

## 2023-01-29 MED ORDER — ORAL CARE MOUTH RINSE: 15.0000 mL | OROMUCOSAL | Status: DC | PRN

## 2023-01-29 MED ORDER — INSULIN ASPART 100 UNIT/ML IJ SOLN
0.0000 [IU] | INTRAMUSCULAR | Status: DC
  Administered 2023-01-31: 5 [IU] via SUBCUTANEOUS

## 2023-01-29 MED ORDER — HEPARIN BOLUS VIA INFUSION
6800.0000 [IU] | Freq: Once | INTRAVENOUS | Status: DC
  Filled 2023-01-29: qty 6800

## 2023-01-29 MED ORDER — SODIUM CHLORIDE 0.9 % IV SOLN: INTRAVENOUS | Status: AC

## 2023-01-29 MED ORDER — SODIUM CHLORIDE 0.9% FLUSH
3.0000 mL | Freq: Two times a day (BID) | INTRAVENOUS | Status: DC
  Administered 2023-01-29 – 2023-02-02 (×5): 3 mL via INTRAVENOUS

## 2023-01-29 MED ORDER — LIDOCAINE HCL 1 % IJ SOLN
INTRAMUSCULAR | Status: AC
Start: 2023-01-29 — End: ?
  Filled 2023-01-29: qty 20

## 2023-01-29 MED ORDER — DOCUSATE SODIUM 100 MG PO CAPS: 100.0000 mg | ORAL_CAPSULE | Freq: Two times a day (BID) | ORAL | Status: DC | PRN

## 2023-01-29 MED ORDER — HEPARIN (PORCINE) 25000 UT/250ML-% IV SOLN
2100.0000 [IU]/h | INTRAVENOUS | Status: DC
Start: 2023-01-29 — End: 2023-02-01
  Administered 2023-01-29: 2100 [IU]/h via INTRAVENOUS
  Administered 2023-01-30: 1900 [IU]/h via INTRAVENOUS
  Administered 2023-01-30: 2100 [IU]/h via INTRAVENOUS
  Administered 2023-01-31 (×2): 1900 [IU]/h via INTRAVENOUS
  Filled 2023-01-29 (×6): qty 250

## 2023-01-29 NOTE — ED Triage Notes (Addendum)
Pt to ED via [facility] from home. Pt has had shortness of breath and had syncopal episode today. Pt was outside fell backwards onto mud. Pt was taken off xarelto approximately 1.79yrs ago, does not take any other blood thinner.   Pt A&Ox4, pt states he was working on his fence, started feeling short of breath and lost consciousness. Pt states he has had trouble breathing increasing since yesterday. Pt c/o shortness of breath and chest tightness at this time.   EMS VS Cbg 171 95% RA 120/92 HR 120-130

## 2023-01-29 NOTE — ED Provider Notes (Addendum)
I provided a substantive portion of the care of this patient.  I personally made/approved the management plan for this patient and take responsibility for the patient management.  EKG Interpretation Date/Time:  Sunday January 29 2023 15:13:25 EST Ventricular Rate:  123 PR Interval:  176 QRS Duration:  90 QT Interval:  297 QTC Calculation: 425 R Axis:   269  Text Interpretation: Sinus tachycardia Ventricular premature complex LAD, consider left anterior fascicular block Low voltage, precordial leads Abnormal R-wave progression, late transition No significant change since last tracing Confirmed by Vanetta Mulders [phone]) on 01/29/2023 3:41:20 PM  Patient seen by me along with physician assistant.  Patient has a history of pulmonary embolism in the past.  Currently not on blood thinners.  Patient was experiencing shortness of breath yesterday had to take it easy.  Today when he got up he actually felt better thought he could do some chores.  Patient works as a Visual merchandiser.  Patient was on Xarelto in the past.  Patient was working on his fence also moving some hay and he had to go move the gait.  And when he did that he passed out.  Patient had a warm flushing feeling when he first woke up.  Patient brought in by [facility].  Patient denies any fevers any upper respiratory symptoms.  Past medical history significant for the deep vein thrombosis and pulmonary embolism in 2022 morbid obesity.  Patient is never used tobacco products.  Patient now on 2 L of high flow nasal cannula oxygen 98%.  Patient is tachycardic with heart rate around 123.  Blood pressure systolic is normal.  Patient's venous blood gas pH is 7.387 pO2 26 pCO2 4 1.  I-STAT without any significant abnormalities white count markedly elevated at 22,000.  Hemoglobin 16.0 and platelets 238.  2 view chest does show hypoinflation without any acute cardiopulmonary disease.  So no evidence of any pneumonia.  Patient's lactic acid is elevated at  2.9.  EKG shows sinus tachycardia no significant change compared to old.  Patient will need CT angio..  Clinically suspect that this may be recurrent PE.  CRITICAL CARE Performed by: Vanetta Mulders Total critical care time: 45 minutes Critical care time was exclusive of separately billable procedures and treating other patients. Critical care was necessary to treat or prevent imminent or life-threatening deterioration. Critical care was time spent personally by me on the following activities: development of treatment plan with patient and/or surrogate as well as nursing, discussions with consultants, evaluation of patient's response to treatment, examination of patient, obtaining history from patient or surrogate, ordering and performing treatments and interventions, ordering and review of laboratory studies, ordering and review of radiographic studies, pulse oximetry and re-evaluation of patient's condition.  Patient with significant bilateral PEs.  Small saddle embolus.  Sure right heart strain.  Will have physician assistant discussed with critical care.  Patient probably needs to be started on heparin.  And will need admission.     Vanetta Mulders, MD 01/29/23 1556    Vanetta Mulders, MD 01/29/23 1714

## 2023-01-29 NOTE — Plan of Care (Signed)

## 2023-01-29 NOTE — H&P (Signed)
NAME:  Nicholas [name], MRN:  [id number], DOB:  1970/08/18, LOS: 0 ADMISSION DATE:  01/29/2023, CONSULTATION DATE:  01/29/2023 REFERRING MD:  Dr. Laveda Norman - EDP, CHIEF COMPLAINT:  Submassive PE   History of Present Illness:  Nicholas [name] is a 53 y.o. with a past medical history significant for prior PE and DVT felt secondary to COVID-19 anticoagulated with Xarelto x 1 year then stopped, prior basal cell carcinoma, type 2 diabetes, and morbid obesity who presented to the ED 1/19 after experiencing syncopal episode.  Further history reveals patient was also short of breath with chest pressure.  On arrival to ED patient was seen tachypneic, tachycardic, and mildly hypertensive.  Given clinical concern for PE patient was taken for CTA chest which revealed acute submassive PE with RV LV ratio 2.4.  Lab work significant for at bedtime troponin 326 > 467, lactic 2.9 > 1.9, WBC 22.2.  PCCM consulted for further management and admission given need for mechanical thrombectomy for submassive PE.   Pertinent  Medical History  Prior PE and DVT felt secondary to COVID-19 anticoagulated with Xarelto x 1 year then stopped, prior basal cell carcinoma, type 2 diabetes, and morbid obesity  Significant Hospital Events: Including procedures, antibiotic start and stop dates in addition to other pertinent events   1/19 presented after syncopal episode, found to have submassive saddle PE  Interim History / Subjective:  Seen lying in bed with ongoing complaints of shortness of breath but denies any chest discomfort  Objective   Blood pressure (!) 128/91, pulse (!) 121, temperature 98 F (36.7 C), temperature source Oral, resp. rate 15, height 6\' 1"  (1.854 m), weight (!) 152.4 kg, SpO2 100%.       No intake or output data in the 24 hours ending 01/29/23 1732 Filed Weights   01/29/23 1430  Weight: (!) 152.4 kg    Examination: General: Well-appearing middle-aged male lying in bed in no acute distress HEENT:  [place]/AT, MM pink/moist, PERRL,  Neuro: Oriented x 3, nonfocal CV: s1s2 regular rate and rhythm, no murmur, rubs, or gallops,  PULM: Clear to auscultation bilaterally, no increased work of breathing, no added breath sounds GI: soft, bowel sounds active in all 4 quadrants, non-tender, non-distended Extremities: warm/dry, no edema  Skin: no rashes or lesions   Resolved Hospital Problem list     Assessment & Plan:  Submassive saddle pulmonary embolism with history of prior PE -CTA chest which revealed acute submassive PE with RV LV ratio 2.4. -Patient did syncopized prior to admission with lab work indicating RV stress including high-sensitivity troponin 326 > 467 and positive lactic acid of 2.9 -Patient reports prior PE happened shortly after suffering COVID-19, states he was anticoagulated with Xarelto x 1 year P: IR consulted, appreciate assistance Continue systemic heparin Obtain echocardiogram No absolute or relative contraindications to tPA if needed Will need lifelong anticoagulation moving forward  Essential hypertension Hyperlipidemia -Home medications include Cozaar and Crestor P: Hold home medications, likely resume tomorrow morning  Type 2 diabetes -Hold home metformin P: SSI  CBG goal 140-180 CBG check q4   Best Practice (right click and "Reselect all SmartList Selections" daily)   Diet/type: NPO DVT prophylaxis systemic dose LMWH Pressure ulcer(s): N/A GI prophylaxis: PPI Lines: N/A Foley:  N/A Code Status:  full code Last date of multidisciplinary goals of care discussion: Aggressive interventions, update patient and family daily   Labs   CBC: Recent Labs  Lab 01/29/23 1510 01/29/23 1521 01/29/23 1526  WBC 22.2*  --   --  NEUTROABS 18.0*  --   --   HGB 16.9 17.0 17.3*  HCT 50.2 50.0 51.0  MCV 92.1  --   --   PLT 238  --   --     Basic Metabolic Panel: Recent Labs  Lab 01/29/23 1510 01/29/23 1521 01/29/23 1526  NA 138 142 141  K 4.1 4.1  4.2  CL 103  --  106  CO2 22  --   --   GLUCOSE 173*  --  169*  BUN 11  --  15  CREATININE 1.19  --  1.10  CALCIUM 9.8  --   --    GFR: Estimated Creatinine Clearance: 123.8 mL/min (by C-G formula based on SCr of 1.1 mg/dL). Recent Labs  Lab 01/29/23 1510 01/29/23 1522 01/29/23 1707  WBC 22.2*  --   --   LATICACIDVEN  --  2.9* 1.9    Liver Function Tests: Recent Labs  Lab 01/29/23 1510  AST 53*  ALT 51*  ALKPHOS 75  BILITOT 1.0  PROT 7.7  ALBUMIN 3.7   No results for input(s): "LIPASE", "AMYLASE" in the last 168 hours. No results for input(s): "AMMONIA" in the last 168 hours.  ABG    Component Value Date/Time   PHART 7.435 06/27/2013 0910   PCO2ART 38.5 06/27/2013 0910   PO2ART 78.8 (L) 06/27/2013 0910   HCO3 24.7 01/29/2023 1521   TCO2 27 01/29/2023 1526   O2SAT 48 01/29/2023 1521     Coagulation Profile: No results for input(s): "INR", "PROTIME" in the last 168 hours.  Cardiac Enzymes: No results for input(s): "CKTOTAL", "CKMB", "CKMBINDEX", "TROPONINI" in the last 168 hours.  HbA1C: Hgb A1c MFr Bld  Date/Time Value Ref Range Status  04/17/2020 10:03 PM 7.2 (H) 4.8 - 5.6 % Final    Comment:    (NOTE) Pre diabetes:          5.7%-6.4%  Diabetes:              >6.4%  Glycemic control for   <7.0% adults with diabetes     CBG: No results for input(s): "GLUCAP" in the last 168 hours.  Review of Systems:   Please see the history of present illness. All other systems reviewed and are negative   Past Medical History:  He,  has a past medical history of Cancer (HCC), Cellulitis of right leg (09/17/2013), COVID-19, Diabetes mellitus without complication (HCC), DVT (deep venous thrombosis) (HCC), Dyspnea, History of kidney stones, Morbid obesity (HCC), and Pulmonary embolism (HCC) (04/2020).   Surgical History:   Past Surgical History:  Procedure Laterality Date   CLAVICLE HARDWARE REMOVAL Left 2007   CYSTOSCOPY/URETEROSCOPY/HOLMIUM LASER/STENT  PLACEMENT Right 12/31/2020   Procedure: CYSTOSCOPY/URETEROSCOPY/HOLMIUM LASER/STENT PLACEMENT;  Surgeon: Orson Ape, MD;  Location: [facility];  Service: Urology;  Laterality: Right;   EXTRACORPOREAL SHOCK WAVE LITHOTRIPSY Right 05/16/2019   Procedure: EXTRACORPOREAL SHOCK WAVE LITHOTRIPSY (ESWL);  Surgeon: Orson Ape, MD;  Location: [facility];  Service: Urology;  Laterality: Right;   EXTRACORPOREAL SHOCK WAVE LITHOTRIPSY Right 07/18/2019   Procedure: EXTRACORPOREAL SHOCK WAVE LITHOTRIPSY (ESWL);  Surgeon: Orson Ape, MD;  Location: [facility];  Service: Urology;  Laterality: Right;   EXTRACORPOREAL SHOCK WAVE LITHOTRIPSY Left 10/24/2019   Procedure: EXTRACORPOREAL SHOCK WAVE LITHOTRIPSY (ESWL);  Surgeon: Orson Ape, MD;  Location: [facility];  Service: Urology;  Laterality: Left;   FRACTURE SURGERY Left 2007   "shattered clavicle; broke it in 9 places"     Social History:  reports that he has never smoked. He has quit using smokeless tobacco.  His smokeless tobacco use included snuff. He reports that he does not currently use alcohol. He reports that he does not use drugs.   Family History:  His family history includes Cancer in an other family member; Heart attack in his maternal grandfather and paternal grandfather; Heart disease in an other family member; Kidney disease in his father; Leukemia in his mother; Stroke in an other family member.   Allergies Allergies  Allergen Reactions   Lisinopril     unknown   Amoxicillin Rash   Penicillins Other (See Comments)    Childhood reaction     Home Medications  Prior to Admission medications   Medication Sig Start Date End Date Taking? Authorizing Provider  Cholecalciferol (VITAMIN D-3) 25 MCG (1000 UT) CAPS Take 1 capsule by mouth daily at 6 (six) AM.    [provider]  losartan (COZAAR) 25 MG tablet Take 12.5 mg by mouth at bedtime. 08/28/20   [provider]  meloxicam (MOBIC) 7.5 MG tablet Take 7.5 mg  by mouth 2 (two) times daily. Patient not taking: Reported on 04/26/2022    [provider]  metFORMIN (GLUCOPHAGE) 500 MG tablet Take 1,000 mg by mouth 2 (two) times daily. 02/05/20   [provider]  Meth-Hyo-M Bl-Na Phos-Ph Sal (URIBEL) 118 MG CAPS Take 1 capsule (118 mg total) by mouth every 6 (six) hours as needed. Patient not taking: Reported on 04/26/2022 12/31/20   Orson Ape, MD  oxyCODONE-acetaminophen (PERCOCET) 5-325 MG tablet Take 1 tablet by mouth every 4 (four) hours as needed for severe pain. Patient not taking: Reported on 04/26/2022 01/02/22   Minna Antis, MD  rivaroxaban (XARELTO) 20 MG TABS tablet Take 20 mg by mouth daily with supper. Patient not taking: Reported on 04/26/2022    [provider]  rosuvastatin (CRESTOR) 5 MG tablet Take 5 mg by mouth at bedtime. 08/28/20   [provider]  RYBELSUS 7 MG TABS Take 7 mg by mouth every morning. 04/16/20   [provider]     Critical care time:   CRITICAL CARE Performed by: Brandolyn Shortridge D. Harris  Total critical care time: 42 minutes  Critical care time was exclusive of separately billable procedures and treating other patients.  Critical care was necessary to treat or prevent imminent or life-threatening deterioration.  Critical care was time spent personally by me on the following activities: development of treatment plan with patient and/or surrogate as well as nursing, discussions with consultants, evaluation of patient's response to treatment, examination of patient, obtaining history from patient or surrogate, ordering and performing treatments and interventions, ordering and review of laboratory studies, ordering and review of radiographic studies, pulse oximetry and re-evaluation of patient's condition.  Kora Groom D. Harris, NP-C [facility] Personal contact information can be found on Amion  If no contact or response made please call [phone] 01/29/2023,  6:27 PM

## 2023-01-29 NOTE — ED Provider Notes (Signed)
[place] EMERGENCY DEPARTMENT AT [facility] Provider Note   CSN: [id number] Arrival date & time: 01/29/23  1420     History  Chief Complaint  Patient presents with   Shortness of Breath    Nicholas [name] is a 53 y.o. male.  The history is provided by the patient, the EMS personnel, the spouse and medical records. No language interpreter was used.  Shortness of Breath    53 year old male with significant history of prior COVID induced PE and was on Xarelto for which he is no longer on any anticoagulation, history of obesity, diabetes, cancer, brought here via EMS from home for concerns of a syncopal episode.  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  When he came to he endorsed feeling quite short of breath, seems that he cannot catch his breath as well as having pressure in his chest.  The symptoms started quite acutely.  He denies any significant injury from his passing out episode.  Denies any confusion but did felt very hot prior to passing out.  He did endorse having some increased shortness of breath since yesterday but denies any significant cough no fever or chills no nausea vomiting diarrhea.  Denies alcohol or tobacco use.  No history of seizure.  Denies any recent surgery, prolonged bedrest, active cancer, leg swelling or calf tenderness.  Home Medications Prior to Admission medications   Medication Sig Start Date End Date Taking? Authorizing Provider  Cholecalciferol (VITAMIN D-3) 25 MCG (1000 UT) CAPS Take 1 capsule by mouth daily at 6 (six) AM.    [provider]  losartan (COZAAR) 25 MG tablet Take 12.5 mg by mouth at bedtime. 08/28/20   [provider]  meloxicam (MOBIC) 7.5 MG tablet Take 7.5 mg by mouth 2 (two) times daily. Patient not taking: Reported on 04/26/2022    [provider]  metFORMIN (GLUCOPHAGE) 500 MG tablet Take 1,000  mg by mouth 2 (two) times daily. 02/05/20   [provider]  Meth-Hyo-M Bl-Na Phos-Ph Sal (URIBEL) 118 MG CAPS Take 1 capsule (118 mg total) by mouth every 6 (six) hours as needed. Patient not taking: Reported on 04/26/2022 12/31/20   Orson Ape, MD  oxyCODONE-acetaminophen (PERCOCET) 5-325 MG tablet Take 1 tablet by mouth every 4 (four) hours as needed for severe pain. Patient not taking: Reported on 04/26/2022 01/02/22   Minna Antis, MD  rivaroxaban (XARELTO) 20 MG TABS tablet Take 20 mg by mouth daily with supper. Patient not taking: Reported on 04/26/2022    [provider]  rosuvastatin (CRESTOR) 5 MG tablet Take 5 mg by mouth at bedtime. 08/28/20   [provider]  RYBELSUS 7 MG TABS Take 7 mg by mouth every morning. 04/16/20   [provider]      Allergies    Lisinopril, Amoxicillin, and Penicillins    Review of Systems   Review of Systems  Respiratory:  Positive for shortness of breath.   All other systems reviewed and are negative.   Physical Exam Updated Vital Signs BP (!) 147/93   Pulse (!) 132   Temp 98 F (36.7 C) (Oral)   Resp (!) 24   Ht 6\' 1"  (1.854 m)   Wt (!) 152.4 kg   SpO2 97%   BMI 44.33 kg/m  Physical Exam Vitals and nursing note reviewed.  Constitutional:      General:  He is not in acute distress.    Appearance: He is well-developed. He is obese.     Interventions: He is not intubated. HENT:     Head: Atraumatic.  Eyes:     Conjunctiva/sclera: Conjunctivae normal.  Neck:     Vascular: No JVD.  Cardiovascular:     Rate and Rhythm: Tachycardia present.     Heart sounds: No murmur heard.    No friction rub.  Pulmonary:     Effort: Pulmonary effort is normal. No accessory muscle usage or respiratory distress. He is not intubated.     Breath sounds: Normal breath sounds. No wheezing, rhonchi or rales.  Abdominal:     Palpations: Abdomen is soft.     Tenderness: There is no abdominal tenderness.   Musculoskeletal:     Cervical back: Neck supple.     Right lower leg: No edema.     Left lower leg: No edema.     Comments: No scalp tenderness no midline spine tenderness  Moving all 4 extremities with equal effort  Skin:    Findings: No rash.  Neurological:     Mental Status: He is alert and oriented to person, place, and time. Mental status is at baseline.  Psychiatric:        Mood and Affect: Mood normal.     ED Results / Procedures / Treatments   Labs (all labs ordered are listed, but only abnormal results are displayed) Labs Reviewed  COMPREHENSIVE METABOLIC PANEL - Abnormal; Notable for the following components:      Result Value   Glucose, Bld 173 (*)    AST 53 (*)    ALT 51 (*)    All other components within normal limits  CBC WITH DIFFERENTIAL/PLATELET - Abnormal; Notable for the following components:   WBC 22.2 (*)    Neutro Abs 18.0 (*)    Monocytes Absolute 1.4 (*)    Abs Immature Granulocytes 0.22 (*)    All other components within normal limits  D-DIMER, QUANTITATIVE - Abnormal; Notable for the following components:   D-Dimer, Quant >20.00 (*)    All other components within normal limits  HEMOGLOBIN A1C - Abnormal; Notable for the following components:   Hgb A1c MFr Bld 6.6 (*)    All other components within normal limits  I-STAT VENOUS BLOOD GAS, ED - Abnormal; Notable for the following components:   pCO2, Ven 41.0 (*)    pO2, Ven 26 (*)    Calcium, Ion 1.12 (*)    All other components within normal limits  I-STAT CHEM 8, ED - Abnormal; Notable for the following components:   Glucose, Bld 169 (*)    Hemoglobin 17.3 (*)    All other components within normal limits  I-STAT CG4 LACTIC ACID, ED - Abnormal; Notable for the following components:   Lactic Acid, Venous 2.9 (*)    All other components within normal limits  TROPONIN I (HIGH SENSITIVITY) - Abnormal; Notable for the following components:   Troponin I (High Sensitivity) 326 (*)    All other  components within normal limits  TROPONIN I (HIGH SENSITIVITY) - Abnormal; Notable for the following components:   Troponin I (High Sensitivity) 467 (*)    All other components within normal limits  TROPONIN I (HIGH SENSITIVITY) - Abnormal; Notable for the following components:   Troponin I (High Sensitivity) 691 (*)    All other components within normal limits  RESP PANEL BY RT-PCR (RSV, FLU A&B, COVID)  RVPGX2  BRAIN NATRIURETIC PEPTIDE  HEPARIN LEVEL (UNFRACTIONATED)  HIV ANTIBODY (ROUTINE TESTING W REFLEX)  BASIC METABOLIC PANEL  MAGNESIUM  PHOSPHORUS  PROTIME-INR  APTT  HEPARIN LEVEL (UNFRACTIONATED)  HEPARIN LEVEL (UNFRACTIONATED)  HEPARIN LEVEL (UNFRACTIONATED)  CBC  CBC  CBC  FIBRINOGEN  FIBRINOGEN  FIBRINOGEN  I-STAT CG4 LACTIC ACID, ED  I-STAT CG4 LACTIC ACID, ED  TROPONIN I (HIGH SENSITIVITY)    EKG EKG Interpretation Date/Time:  Sunday January 29 2023 15:13:25 EST Ventricular Rate:  123 PR Interval:  176 QRS Duration:  90 QT Interval:  297 QTC Calculation: 425 R Axis:   269  Text Interpretation: Sinus tachycardia Ventricular premature complex LAD, consider left anterior fascicular block Low voltage, precordial leads Abnormal R-wave progression, late transition No significant change since last tracing Confirmed by Vanetta Mulders [id number]) on 01/29/2023 3:41:20 PM  Radiology CT Angio Chest PE W and/or Wo Contrast Result Date: 01/29/2023 CLINICAL DATA:  Short of breath.  Elevated D-dimer. EXAM: CT ANGIOGRAPHY CHEST WITH CONTRAST TECHNIQUE: Multidetector CT imaging of the chest was performed using the standard protocol during bolus administration of intravenous contrast. Multiplanar CT image reconstructions and MIPs were obtained to evaluate the vascular anatomy. RADIATION DOSE REDUCTION: This exam was performed according to the departmental dose-optimization program which includes automated exposure control, adjustment of the mA and/or kV according to patient size  and/or use of iterative reconstruction technique. CONTRAST:  75mL OMNIPAQUE IOHEXOL 350 MG/ML SOLN COMPARISON:  01/02/2022. FINDINGS: Cardiovascular: There are multiple bilateral pulmonary emboli. Emboli noted in the main pulmonary arteries extending into all segmental branches, slightly higher burden on the right than the left. A thin saddle embolus crosses from the left to right pulmonary arteries. RV LV ratio is 2.4, significantly elevated consistent with right heart strain. Heart is normal in overall size. No pericardial effusion. Aorta is normal in caliber. No dissection or atherosclerosis. Mediastinum/Nodes: No neck base, mediastinal or hilar masses. No enlarged lymph nodes. Trachea esophagus are unremarkable. Lungs/Pleura: Mild linear opacities, mostly in the lower lungs, consistent with atelectasis. No lung consolidation to suggest infarction. No pulmonary edema. No pleural effusion or pneumothorax. Upper Abdomen: No acute abnormality. Musculoskeletal: No acute fracture. No bone lesion. No chest wall mass. Review of the MIP images confirms the above findings. IMPRESSION: 1. Positive for acute bilateral pulmonary emboli. Multiple emboli in the main pulmonary arteries extending into all segmental branches. Thin saddle embolus. 2. Positive for right heart strain (RV/LV Ratio = 2.4 ) consistent with at least submassive (intermediate risk) PE. The presence of right heart strain has been associated with an increased risk of morbidity and mortality. Please refer to the "Code PE Focused" order set in EPIC. Critical Value/emergent results were called by telephone at the time of interpretation on 01/29/2023 at 5:09 pm to provider [facility] , who verbally acknowledged these results. Electronically Signed   By: Amie Portland M.D.   On: 01/29/2023 17:09   DG Chest 2 View Result Date: 01/29/2023 CLINICAL DATA:  Chest pain. EXAM: CHEST - 2 VIEW COMPARISON:  04/07/2022 FINDINGS: Lungs are hypoinflated without focal  airspace consolidation or effusion. Cardiomediastinal silhouette and remainder the exam is unchanged. IMPRESSION: Hypoinflation without acute cardiopulmonary disease. Electronically Signed   By: Elberta Fortis M.D.   On: 01/29/2023 15:18    Procedures .Critical Care  Performed by: Fayrene Helper, PA-C Authorized by: Fayrene Helper, PA-C   Critical care provider statement:    Critical care time (minutes):  45   Critical care was time spent personally by  me on the following activities:  Development of treatment plan with patient or surrogate, discussions with consultants, evaluation of patient's response to treatment, examination of patient, ordering and review of laboratory studies, ordering and review of radiographic studies, ordering and performing treatments and interventions, pulse oximetry, re-evaluation of patient's condition and review of old charts     Medications Ordered in ED Medications  heparin ADULT infusion 100 units/mL (25000 units/219mL) (has no administration in time range)  heparin bolus via infusion 6,500 Units (has no administration in time range)  sodium chloride 0.9 % bolus 1,000 mL (0 mLs Intravenous Stopped 01/29/23 1644)  iohexol (OMNIPAQUE) 350 MG/ML injection 75 mL (75 mLs Intravenous Contrast Given 01/29/23 1639)    ED Course/ Medical Decision Making/ A&P                                 Medical Decision Making Amount and/or Complexity of Data Reviewed Labs: ordered. Radiology: ordered.  Risk Prescription drug management. Decision regarding hospitalization.   BP (!) 147/93   Pulse (!) 132   Temp 98 F (36.7 C) (Oral)   Resp (!) 24   Ht 6\' 1"  (1.854 m)   Wt (!) 152.4 kg   SpO2 97%   BMI 44.33 kg/m   57:70 PM 53 year old male with significant history of prior COVID induced PE and was on Xarelto for which he is no longer on any anticoagulation, history of obesity, diabetes, cancer, brought here via EMS from home for concerns of a syncopal episode.  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  When he came to he endorsed feeling quite short of breath, seems that he cannot catch his breath as well as having pressure in his chest.  The symptoms started quite acutely.  He denies any significant injury from his passing out episode.  Denies any confusion but did felt very hot prior to passing out.  He did endorse having some increased shortness of breath since yesterday but denies any significant cough no fever or chills no nausea vomiting diarrhea.  Denies alcohol or tobacco use.  No history of seizure.  Denies any recent surgery, prolonged bedrest, active cancer, leg swelling or calf tenderness.  On exam, this is an obese male laying in bed appears to be in no acute discomfort.  Exam remarkable for tachycardia however lungs are clear, no evidence of peripheral edema no calf tenderness he is alert and oriented x 4, moving all 4 extremities without difficulty.  No signs of trauma from his syncopal episode.  No seizure activities.  Vitals are notable for heart rate of 132, respiratory of 24, he is afebrile no hypoxia currently.  Due to prior history of PEs and DVT, patient is at high risk of developing blood clot.  Will obtain chest CT angiogram to rule out PE.  -Labs ordered, independently viewed and interpreted by me.  Labs remarkable for troponin 326, likely 2/2 to right heart strain from saddle PE.  WBC 22 likely due to stress demargination,  -The patient was maintained on a cardiac monitor.  I personally viewed and interpreted the cardiac monitored which showed an underlying rhythm of: sinus tachycardia -Imaging independently viewed and interpreted by me and I agree with radiologist's interpretation.  Result remarkable for CHest CTA showing saddle PE with right heart strain -This patient presents to  the ED for concern of syncope, this involves an extensive number of  treatment options, and is a complaint that carries with it a high risk of complications and morbidity.  The differential diagnosis includes saddle PE, anemia, stroke, ACS, hypoglycemia -Co morbidities that complicate the patient evaluation includes hx of PE  -Treatment includes code PE, heparin, supplemental O2, NS -Reevaluation of the patient after these medicines showed that the patient improved -PCP office notes or outside notes reviewed -Discussion with specialist critical care intensivist who will evaluate pt and determine disposition -Escalation to admission/observation considered: patient is amenable with admission  Workup remarkable for evidence of saddle emboli with significant clot burden involving both lungs with evidence of right heart strain.  I have activated code PE, will initiate heparin per pharmacy and I will consult intensivist for hospital admission.        Final Clinical Impression(s) / ED Diagnoses Final diagnoses:  Acute saddle pulmonary embolism with acute cor pulmonale [facility])    Rx / DC Orders ED Discharge Orders     None         Fayrene Helper, PA-C 01/29/23 1942    Vanetta Mulders, MD 02/01/23 1414

## 2023-01-29 NOTE — Progress Notes (Addendum)
eLink Physician-Brief Progress Note Patient Name: IBRAHAM NEDD DOB: 1970/07/10 MRN: [id number]   Date of Service  01/29/2023  HPI/Events of Note  53 yr old man back in Icu after IR intervention for PE. Resting in bed. HR 112. O2 97 and MAP in 90s. No distress. Planned for TPA per IR and heparin .   eICU Interventions  Call E link if needed      Intervention Category Major Interventions: Other: Evaluation Type: New Patient Evaluation  Meili Kleckley G Deysy Schabel 01/29/2023, 9:12 PM  Addendum at 10-50 pm - RN notified us of some intermittent chest pressure. Goes away with cough. Pleuritic. Not new . Has PE so not surprising. No change in vitals. EKG done but not in EMR and RN says no acute changes. Was emailed to [place] as well but has not crossed over to the system. On heparin for the PE and no pain now

## 2023-01-29 NOTE — Consult Note (Signed)
Vascular and Interventional Radiology  PRE PROCEDURE H&P  Assessment  Plan:   [name] is a 53 y.o. year old male who will undergo PULMONARY ARTERIOGRAPHY, POSSIBLE INTERVENTION in Interventional Radiology.  The procedure has been fully reviewed with the patient/patient's authorized representative. The risks, benefits and alternatives have been explained, and the patient/patient's authorized representative has consented to the procedure.  HPI: [name] is a 53 y/o M comorbid including morbid obesity (BMI 44) and prior Hx of DVT / PE in 2022, previously on Xarelto, who presented to ER w SOB and syncopal episode. Pt tachycardic but normotensive in ER. Cardiac enzymes POSITIVE. CTA chest demonstrating saddle embolus and R heart strain with RV/LV ratio of 2.4.     VIR On Call contacted by PCCM to evaluate for potential intervention. Informed consent was obtained, witnessed and placed in the patient's chart.  Allergies:  Allergies  Allergen Reactions   Lisinopril     unknown   Amoxicillin Rash   Penicillins Other (See Comments)    Childhood reaction    Medications:  No current facility-administered medications on file prior to encounter.   Current Outpatient Medications on File Prior to Encounter  Medication Sig Dispense Refill   Cholecalciferol (VITAMIN D-3) 25 MCG (1000 UT) CAPS Take 1 capsule by mouth daily at 6 (six) AM.     losartan (COZAAR) 25 MG tablet Take 12.5 mg by mouth at bedtime.     meloxicam (MOBIC) 7.5 MG tablet Take 7.5 mg by mouth 2 (two) times daily. (Patient not taking: Reported on 04/26/2022)     metFORMIN (GLUCOPHAGE) 500 MG tablet Take 1,000 mg by mouth 2 (two) times daily.     Meth-Hyo-M Bl-Na Phos-Ph Sal (URIBEL) 118 MG CAPS Take 1 capsule (118 mg total) by mouth every 6 (six) hours as needed. (Patient not taking: Reported on 04/26/2022) 40 capsule 3   oxyCODONE-acetaminophen (PERCOCET) 5-325 MG tablet Take 1 tablet by mouth every 4  (four) hours as needed for severe pain. (Patient not taking: Reported on 04/26/2022) 20 tablet 0   rivaroxaban (XARELTO) 20 MG TABS tablet Take 20 mg by mouth daily with supper. (Patient not taking: Reported on 04/26/2022)     rosuvastatin (CRESTOR) 5 MG tablet Take 5 mg by mouth at bedtime.     RYBELSUS 7 MG TABS Take 7 mg by mouth every morning.      PSH:  Past Surgical History:  Procedure Laterality Date   CLAVICLE HARDWARE REMOVAL Left 2007   CYSTOSCOPY/URETEROSCOPY/HOLMIUM LASER/STENT PLACEMENT Right 12/31/2020   Procedure: CYSTOSCOPY/URETEROSCOPY/HOLMIUM LASER/STENT PLACEMENT;  Surgeon: Orson Ape, MD;  Location: [facility] ORS;  Service: Urology;  Laterality: Right;   EXTRACORPOREAL SHOCK WAVE LITHOTRIPSY Right 05/16/2019   Procedure: EXTRACORPOREAL SHOCK WAVE LITHOTRIPSY (ESWL);  Surgeon: Orson Ape, MD;  Location: [facility] ORS;  Service: Urology;  Laterality: Right;   EXTRACORPOREAL SHOCK WAVE LITHOTRIPSY Right 07/18/2019   Procedure: EXTRACORPOREAL SHOCK WAVE LITHOTRIPSY (ESWL);  Surgeon: Orson Ape, MD;  Location: [facility] ORS;  Service: Urology;  Laterality: Right;   EXTRACORPOREAL SHOCK WAVE LITHOTRIPSY Left 10/24/2019   Procedure: EXTRACORPOREAL SHOCK WAVE LITHOTRIPSY (ESWL);  Surgeon: Orson Ape, MD;  Location: [facility] ORS;  Service: Urology;  Laterality: Left;   FRACTURE SURGERY Left 2007   "shattered clavicle; broke it in 9 places"    PMH:  Past Medical History:  Diagnosis Date   Cancer (HCC)    basal cell skin cancer   Cellulitis of right leg 09/17/2013  COVID-19    Diabetes mellitus without complication (HCC)    DVT (deep venous thrombosis) (HCC)    Dyspnea    due to PE   History of kidney stones    Morbid obesity (HCC)    Pulmonary embolism (HCC) 04/2020    Brief Physical Examination: Vitals:   01/29/23 1615 01/29/23 1645  BP: (!) 123/99 (!) 128/91  Pulse: (!) 118 (!) 121  Resp: 14 15  Temp:    SpO2: 98% 100%   General: WD, WN male in mild  distress HEENT: Normocephalic, atraumatic Lungs: Respirations labored   Sedation / Anesthesia: Moderate Sedation Airway assessment: normal Mallampati: III (soft and hard palate and base of uvula visible) ASA scoring: ASA 3 - Patient with moderate systemic disease with functional limitations   Nicholas Banning, MD [facility] Radiology   Pager. [phone] Clinic. [phone]

## 2023-01-29 NOTE — ED Notes (Signed)
Patient taken to IR by IR tech.

## 2023-01-29 NOTE — ED Notes (Signed)
Patient transported to CT by myself on monitor without incident. Patient was noted to have turned purple from nipple line up while lying flat for CT. Patient promptly sat up upon return to stretcher.

## 2023-01-29 NOTE — Procedures (Signed)
Vascular and Interventional Radiology Procedure Note  Patient: Nicholas [name] DOB: 1970/10/04 Medical Record Number: [id number] Note Date/Time: 01/29/23 6:00 PM   Performing Physician: Roanna Banning, MD Assistant(s): None  Diagnosis: Submassive PE. 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 - Pulmonary arterial pressures, 58/19 [35 mean PAP] - PE burden at bilateral pulmonary arterial bifurcations, with successful lytic catheter placement(s).  - RIGHT Uni-Fuse ; 10 cm infusion and 90 cm total length catheter - LEFT Uni-Fuse ; 10 cm infusion and 90 cm total length catheter  Plan: - each infusion catheter will receive 1 mg/hr tPA, x 12 hrs, for a total of 24 mg tPA over 12 hrs - q6h H/h, fibrinogen, and heparin levels - the sheaths will receive each 20 mL/hr of NS gtt for KVO - NPO vs Clear liquid diet recommended - Pharmacy [facility] team) consult for heparin level management.  - Page PCCM and VIR On Call if fibrinogen <150, severe HA, AMS, or new / acute GIB - Bedrest with RLE straight x 12 hrs, while the catheters are in place.  - VIR service to follow and will assess catheter function for removal on rounds in AM.  Final report to follow once all images are reviewed and compared with previous studies.  See detailed dictation with images in PACS. The patient tolerated the procedure well without incident or complication and was returned to ICU in stable condition.    Roanna Banning, MD [facility] Radiology   Pager. [phone] Clinic. [phone]

## 2023-01-29 NOTE — Progress Notes (Signed)
ANTICOAGULATION CONSULT NOTE - Initial Consult  Pharmacy Consult for Heparin Indication: pulmonary embolus  Allergies  Allergen Reactions   Lisinopril     unknown   Amoxicillin Rash   Penicillins Other (See Comments)    Childhood reaction    Patient Measurements: Height: 6\' 1"  (185.4 cm) Weight: (!) 152.4 kg (336 lb) IBW/kg (Calculated) : 79.9 Heparin Dosing Weight: 115.6  Vital Signs: Temp: 98 F (36.7 C) (01/19 1424) Temp Source: Oral (01/19 1424) BP: 128/91 (01/19 1645) Pulse Rate: 121 (01/19 1645)  Labs: Recent Labs    01/29/23 1510 01/29/23 1521 01/29/23 1526  HGB 16.9 17.0 17.3*  HCT 50.2 50.0 51.0  PLT 238  --   --   CREATININE 1.19  --  1.10  TROPONINIHS 326*  --   --     Estimated Creatinine Clearance: 123.8 mL/min (by C-G formula based on SCr of 1.1 mg/dL).   Medical History: Past Medical History:  Diagnosis Date   Cancer (HCC)    basal cell skin cancer   Cellulitis of right leg 09/17/2013   COVID-19    Diabetes mellitus without complication (HCC)    DVT (deep venous thrombosis) (HCC)    Dyspnea    due to PE   History of kidney stones    Morbid obesity (HCC)    Pulmonary embolism (HCC) 04/2020    Medications:  (Not in a hospital admission)  Scheduled:  Infusions:  PRN:   Assessment: 52 yom with a history of COVID induced PE previously on Xarelto (no anticoagulation currently), obesity, DM, cancer. Patient is presenting with syncopal episode. Heparin per pharmacy consult placed for pulmonary embolus.  CTA PE acute bilateral pulmonary emboli. Multiple emboli in the main pulmonary arteries extending into all segmental branches. Thin saddle embolus. W/ RHS  Patient is not on anticoagulation prior to arrival.  Hgb 17.3; plt 238 D-Dimer > 20.00  Goal of Therapy:  Heparin level 0.3-0.7 units/ml Monitor platelets by anticoagulation protocol: Yes   Plan:  Give IV heparin 6500 units bolus x 1 Start heparin infusion at 2100  units/hr Check anti-Xa level in 6 hours and daily while on heparin Continue to monitor H&H and platelets  Delmar Landau, PharmD, BCPS 01/29/2023 5:21 PM ED Clinical Pharmacist -  [phone]

## 2023-01-30 ENCOUNTER — Inpatient Hospital Stay (HOSPITAL_COMMUNITY): Payer: 59

## 2023-01-30 ENCOUNTER — Other Ambulatory Visit (HOSPITAL_COMMUNITY): Payer: Self-pay

## 2023-01-30 ENCOUNTER — Telehealth (HOSPITAL_COMMUNITY): Payer: Self-pay | Admitting: Pharmacy Technician

## 2023-01-30 DIAGNOSIS — I10 Essential (primary) hypertension: Secondary | ICD-10-CM

## 2023-01-30 DIAGNOSIS — E119 Type 2 diabetes mellitus without complications: Secondary | ICD-10-CM | POA: Diagnosis not present

## 2023-01-30 DIAGNOSIS — I2692 Saddle embolus of pulmonary artery without acute cor pulmonale: Secondary | ICD-10-CM

## 2023-01-30 DIAGNOSIS — N132 Hydronephrosis with renal and ureteral calculous obstruction: Secondary | ICD-10-CM

## 2023-01-30 DIAGNOSIS — E785 Hyperlipidemia, unspecified: Secondary | ICD-10-CM | POA: Diagnosis not present

## 2023-01-30 DIAGNOSIS — I2609 Other pulmonary embolism with acute cor pulmonale: Secondary | ICD-10-CM | POA: Diagnosis not present

## 2023-01-30 HISTORY — PX: IR THROMB F/U EVAL ART/VEN FINAL DAY (MS): IMG5379

## 2023-01-30 LAB — ECHOCARDIOGRAM COMPLETE
AR max vel: 4.17 cm2
AV Area VTI: 4.16 cm2
AV Area mean vel: 4.11 cm2
AV Mean grad: 2 mm[Hg]
AV Peak grad: 3.8 mm[Hg]
Ao pk vel: 0.98 m/s
Area-P 1/2: 5.31 cm2
Calc EF: 57.2 %
Height: 73 in
S' Lateral: 3.5 cm
Single Plane A2C EF: 55.2 %
Single Plane A4C EF: 58.8 %
Weight: 5273.4 [oz_av]

## 2023-01-30 LAB — BASIC METABOLIC PANEL
Anion gap: 11 (ref 5–15)
BUN: 11 mg/dL (ref 6–20)
CO2: 19 mmol/L — ABNORMAL LOW (ref 22–32)
Calcium: 8.6 mg/dL — ABNORMAL LOW (ref 8.9–10.3)
Chloride: 110 mmol/L (ref 98–111)
Creatinine, Ser: 0.98 mg/dL (ref 0.61–1.24)
GFR, Estimated: >60 mL/min (ref >60)
Glucose, Bld: 144 mg/dL — ABNORMAL HIGH (ref 70–99)
Potassium: 3.9 mmol/L (ref 3.5–5.1)
Sodium: 140 mmol/L (ref 135–145)

## 2023-01-30 LAB — URINALYSIS, ROUTINE W REFLEX MICROSCOPIC
Bilirubin Urine: NEGATIVE
Glucose, UA: NEGATIVE mg/dL
Ketones, ur: NEGATIVE mg/dL
Nitrite: NEGATIVE
Protein, ur: NEGATIVE mg/dL
RBC / HPF: >50 RBC/hpf (ref 0–5)
Specific Gravity, Urine: 1.025 (ref 1.005–1.030)
pH: 5 (ref 5.0–8.0)

## 2023-01-30 LAB — CBC
HCT: 42.7 % (ref 39.0–52.0)
HCT: 43.2 % (ref 39.0–52.0)
HCT: 45.4 % (ref 39.0–52.0)
Hemoglobin: 14.3 g/dL (ref 13.0–17.0)
Hemoglobin: 14.6 g/dL (ref 13.0–17.0)
Hemoglobin: 15.2 g/dL (ref 13.0–17.0)
MCH: 30.4 pg (ref 26.0–34.0)
MCH: 30.7 pg (ref 26.0–34.0)
MCH: 30.9 pg (ref 26.0–34.0)
MCHC: 33.5 g/dL (ref 30.0–36.0)
MCHC: 33.5 g/dL (ref 30.0–36.0)
MCHC: 33.8 g/dL (ref 30.0–36.0)
MCV: 90.8 fL (ref 80.0–100.0)
MCV: 90.8 fL (ref 80.0–100.0)
MCV: 92.2 fL (ref 80.0–100.0)
Platelets: 156 10*3/uL (ref 150–400)
Platelets: 170 10*3/uL (ref 150–400)
Platelets: 175 10*3/uL (ref 150–400)
RBC: 4.63 MIL/uL (ref 4.22–5.81)
RBC: 4.76 MIL/uL (ref 4.22–5.81)
RBC: 5 MIL/uL (ref 4.22–5.81)
RDW: 13.7 % (ref 11.5–15.5)
RDW: 13.8 % (ref 11.5–15.5)
RDW: 14 % (ref 11.5–15.5)
WBC: 13.7 10*3/uL — ABNORMAL HIGH (ref 4.0–10.5)
WBC: 18.4 10*3/uL — ABNORMAL HIGH (ref 4.0–10.5)
WBC: 19.3 10*3/uL — ABNORMAL HIGH (ref 4.0–10.5)
nRBC: 0 % (ref 0.0–0.2)
nRBC: 0 % (ref 0.0–0.2)
nRBC: 0 % (ref 0.0–0.2)

## 2023-01-30 LAB — HEPARIN LEVEL (UNFRACTIONATED)
Heparin Unfractionated: 0.65 [IU]/mL (ref 0.30–0.70)
Heparin Unfractionated: 0.74 [IU]/mL — ABNORMAL HIGH (ref 0.30–0.70)
Heparin Unfractionated: 0.6 [IU]/mL (ref 0.30–0.70)
Heparin Unfractionated: 0.51 [IU]/mL (ref 0.30–0.70)

## 2023-01-30 LAB — TROPONIN I (HIGH SENSITIVITY): Troponin I (High Sensitivity): 537 ng/L (ref <18)

## 2023-01-30 LAB — GLUCOSE, CAPILLARY
Glucose-Capillary: 141 mg/dL — ABNORMAL HIGH (ref 70–99)
Glucose-Capillary: 120 mg/dL — ABNORMAL HIGH (ref 70–99)
Glucose-Capillary: 119 mg/dL — ABNORMAL HIGH (ref 70–99)
Glucose-Capillary: 125 mg/dL — ABNORMAL HIGH (ref 70–99)
Glucose-Capillary: 162 mg/dL — ABNORMAL HIGH (ref 70–99)
Glucose-Capillary: 133 mg/dL — ABNORMAL HIGH (ref 70–99)

## 2023-01-30 LAB — FIBRINOGEN
Fibrinogen: 377 mg/dL (ref 210–475)
Fibrinogen: 431 mg/dL (ref 210–475)
Fibrinogen: 424 mg/dL (ref 210–475)

## 2023-01-30 LAB — MAGNESIUM: Magnesium: 2 mg/dL (ref 1.7–2.4)

## 2023-01-30 LAB — PHOSPHORUS: Phosphorus: 2.8 mg/dL (ref 2.5–4.6)

## 2023-01-30 MED ORDER — LACTATED RINGERS IV SOLN: INTRAVENOUS | Status: DC

## 2023-01-30 MED ORDER — PERFLUTREN LIPID MICROSPHERE
1.0000 mL | INTRAVENOUS | Status: AC | PRN
  Administered 2023-01-30: 2 mL via INTRAVENOUS

## 2023-01-30 MED ORDER — KETOROLAC TROMETHAMINE 30 MG/ML IJ SOLN
15.0000 mg | Freq: Once | INTRAMUSCULAR | Status: AC
  Administered 2023-01-30: 15 mg via INTRAVENOUS
  Filled 2023-01-30: qty 1

## 2023-01-30 MED ORDER — HYDROCODONE-ACETAMINOPHEN 5-325 MG PO TABS
1.0000 | ORAL_TABLET | Freq: Once | ORAL | Status: AC
Start: 2023-01-30 — End: 2023-01-30
  Administered 2023-01-30: 1 via ORAL
  Filled 2023-01-30: qty 1

## 2023-01-30 MED ORDER — LACTATED RINGERS IV BOLUS
1000.0000 mL | Freq: Once | INTRAVENOUS | Status: AC
  Administered 2023-01-30: 1000 mL via INTRAVENOUS

## 2023-01-30 MED ORDER — HYDROMORPHONE HCL 1 MG/ML IJ SOLN: 0.5000 mg | INTRAMUSCULAR | Status: DC | PRN

## 2023-01-30 MED ORDER — FAMOTIDINE IN NACL 20-0.9 MG/50ML-% IV SOLN
20.0000 mg | Freq: Once | INTRAVENOUS | Status: AC
  Administered 2023-01-30: 20 mg via INTRAVENOUS
  Filled 2023-01-30: qty 50

## 2023-01-30 NOTE — Progress Notes (Signed)
Referring Physician(s): Lynnell Catalan, MD  Supervising Physician: Irish Lack  Patient Status:  [facility] - In-pt  Chief Complaint: Pulmonary embolism s/p catheter directed thrombolysis 01/29/23 in IR (Dr. Milford Cage)  Subjective:  Patient seen in ICU with RN, he reports that his breathing has improved and he feels fairly well overall. Denies chest pain, dizziness. States when he turns his head too fast has a brief headache which quickly resolves. Has not ambulated yet, stood at the side of the bed to use the bathroom earlier and was slightly lightheaded when he first stood up but did not feel like he was going to pass out.   Allergies: Lisinopril, Amoxicillin, and Penicillins  Medications: Prior to Admission medications   Medication Sig Start Date End Date Taking? Authorizing Provider  losartan (COZAAR) 25 MG tablet Take 12.5 mg by mouth at bedtime. 08/28/20  Yes [provider]  metFORMIN (GLUCOPHAGE) 500 MG tablet Take 1,000 mg by mouth 2 (two) times daily. 02/05/20  Yes [provider]  rivaroxaban (XARELTO) 20 MG TABS tablet Take 20 mg by mouth daily with supper.   Yes [provider]  rosuvastatin (CRESTOR) 5 MG tablet Take 5 mg by mouth at bedtime. 08/28/20  Yes [provider]  RYBELSUS 7 MG TABS Take 7 mg by mouth every morning. 04/16/20  Yes [provider]     Vital Signs: BP 101/72   Pulse 82   Temp 98.2 F (36.8 C) (Oral)   Resp 13   Ht 6\' 1"  (1.854 m)   Wt (!) 329 lb 9.4 oz (149.5 kg)   SpO2 95%   BMI 43.48 kg/m   Physical Exam Vitals and nursing note reviewed.  Constitutional:      General: He is not in acute distress. HENT:     Head: Normocephalic.  Cardiovascular:     Rate and Rhythm: Normal rate.     Comments: (+) right groin puncture site clean, dry, dressed appropriately. No bleeding or significant bruising. Soft, non tender, no significant ecchymosis.  Pulmonary:     Effort: Pulmonary effort is normal.   Abdominal:     Palpations: Abdomen is soft.  Skin:    General: Skin is warm and dry.  Neurological:     Mental Status: He is alert. Mental status is at baseline.  Psychiatric:        Mood and Affect: Mood normal.        Behavior: Behavior normal.        Thought Content: Thought content normal.        Judgment: Judgment normal.     Imaging: CT RENAL STONE STUDY Result Date: 01/30/2023 CLINICAL DATA:  Abdominal and flank pain.  Nephrolithiasis. EXAM: CT ABDOMEN AND PELVIS WITHOUT CONTRAST TECHNIQUE: Multidetector CT imaging of the abdomen and pelvis was performed following the standard protocol without IV contrast. RADIATION DOSE REDUCTION: This exam was performed according to the departmental dose-optimization program which includes automated exposure control, adjustment of the mA and/or kV according to patient size and/or use of iterative reconstruction technique. COMPARISON:  Chest CTA on 01/29/2023, and AP CT on 12/21/2020 FINDINGS: Lower chest: New sub-solid opacity in the posterior right lower lobe which measures 4.9 x 3.0 cm, consistent with pulmonary infarct given pulmonary emboli shown on chest CT performed yesterday. Hepatobiliary: No mass visualized on this unenhanced exam. Gallbladder contains contrast material attributed to IV contrast administered yesterday, but is otherwise unremarkable. Pancreas: No mass or inflammatory process visualized on this unenhanced exam. Spleen:  Within normal limits in size. Adrenals/Urinary tract: Bilateral renal calculi are again seen, largest measuring 14 mm in lower pole of left kidney. Mild-to-moderate left hydronephrosis is seen due to a 8 mm calculus at the left UPJ. Normal appearance of bladder. Stomach/Bowel: No evidence of obstruction, inflammatory process, or abnormal fluid collections. Normal appendix visualized. Diverticulosis is seen mainly involving the sigmoid colon, however there is no evidence of diverticulitis. Vascular/Lymphatic: No  pathologically enlarged lymph nodes identified. No evidence of abdominal aortic aneurysm. Reproductive:  No mass or other significant abnormality. Other:  Mild hemorrhage seen in right groin soft tissues. Musculoskeletal:  No suspicious bone lesions identified. IMPRESSION: Mild-to-moderate left hydronephrosis due to 8 mm calculus at the left UPJ. Bilateral nephrolithiasis. Colonic diverticulosis, without radiographic evidence of diverticulitis. Mild hemorrhage in right groin soft tissues. New 4.9 cm sub-solid opacity in posterior right lower lobe, consistent with pulmonary infarct given acute pulmonary embolism shown on chest CTA performed yesterday. Electronically Signed   By: Danae Orleans M.D.   On: 01/30/2023 12:14   IR THROMB F/U EVAL ART/VEN FINAL DAY (MS) Result Date: 01/30/2023 INDICATION: History of sub massive pulmonary embolism, post initiation of bilateral pulmonary arterial thrombolysis on 01/29/2023. Patient has completed prescribed course of bilateral pulmonary arterial lytic infusion and presents today for repeat pulmonary arterial pressure measurements prior to infusion catheter removal. EXAM: PULMONARY ARTERIAL PRESSURE MEASUREMENT AND INFUSION CATHETER(S) REMOVAL COMPARISON:  IR fluoroscopy, 01/29/2023. Chest XR earlier same day. CTA PE, 01/29/2023 MEDICATIONS: None CONTRAST:  None FLUOROSCOPY TIME:  None COMPLICATIONS: None immediate. TECHNIQUE: The patient was positioned supine on there hospital bed. Review of this morning's chest radiograph demonstrates unchanged positioning of bilateral pulmonary arterial infusion catheters with tip of the right-sided pulmonary arterial infusion catheter approximately 5-10 cm peripheral to the expected outflow of the main pulmonary artery. As such, the right pulmonary arterial catheter's infusion wire was removed and the multi side-hole infusion catheter was retracted to the estimated location of the main pulmonary artery. Pressure measurements were acquired  from this location. At this point, the procedure was terminated. All wires, catheters and sheaths were removed from the patient. Hemostasis was achieved at the right groin access site with manual compression. A dressing was placed. The patient tolerated the procedure well without immediate postprocedural complication. FINDINGS: Acquired pressure measurements as follows: Preprocedural main pulmonary artery-58/19; mean-35 (normal: < 25/10) Postprocedural main pulmonary artery-31/14; mean-20 IMPRESSION: Successful BILATERAL catheter directed pulmonary arterial thrombolysis with reduction (15 mmHg) in pressure within the main pulmonary artery. Roanna Banning, MD [facility] Radiology Electronically Signed   By: Roanna Banning M.D.   On: 01/30/2023 09:29   IR INFUSION THROMBOL ARTERIAL INITIAL (MS) Result Date: 01/30/2023 INDICATION: Syncope with submassive PE. RIGHT heart strain RV LV ratio 2.4. POSITIVE troponins. EXAM: Procedures: 1. PULMONARY ARTERIOGRAPHY 2. PLACEMENT OF BILATERAL PULMONARY ARTERIAL LYTIC INFUSION CATHETERS COMPARISON:  Chest XR and CTA PE, 01/29/2023. MEDICATIONS: None ANESTHESIA/SEDATION: Moderate (conscious) sedation was employed during this procedure. A total of Versed 2 mg and Fentanyl 100 mcg was administered intravenously. Moderate Sedation Time: 55 minutes. [name] level of consciousness and vital signs were monitored continuously by radiology nursing throughout the procedure under my direct supervision. CONTRAST:  30mL OMNIPAQUE IOHEXOL 300 MG/ML SOLN, 40mL OMNIPAQUE IOHEXOL 300 MG/ML SOLN FLUOROSCOPY TIME:  Fluoroscopic dose; 423 mGy COMPLICATIONS: None immediate. TECHNIQUE: Informed written consent was obtained from the patient and/or patient's representative after a discussion of the risks, benefits and alternatives to treatment. Questions regarding the  procedure were encouraged and answered. A timeout was performed prior to the  initiation of the procedure. Ultrasound scanning was performed of the RIGHT groin and demonstrated patency of the common femoral vein. As such, the RIGHT common femoral vein was selected venous access. The RIGHT groin was prepped and draped in the usual sterile fashion, and a sterile drape was applied covering the operative field. Maximum barrier sterile technique with sterile gowns and gloves were used for the procedure. A timeout was performed prior to the initiation of the procedure. Local anesthesia was provided with 1% lidocaine. Under direct ultrasound guidance, the RIGHT common femoral vein was accessed with a micro puncture kit ultimately allowing placement of a 6 Fr 10 cm vascular sheath. Slightly cranial to this initial access, the RIGHT common femoral was again accessed with an additional micropuncture kit ultimately allowing placement of an additional 6 Fr 10 cm vascular sheath. Ultrasound images were saved for procedural documentation purposes. With the use of a Bentson wire, an angled pulmonary catheter was advanced into the main pulmonary artery and a limited central pulmonary arteriogram was performed. Pressure measurements were then obtained from the main pulmonary artery. A 5 Fr angled pigtail catheter was advanced into the distal branch of the LEFT lower lobe pulmonary artery. Limited contrast injection confirmed appropriate positioning. Over an exchange length Pollyann Kennedy, the catheter was exchanged for a 90/10 cm multi side-hole infusion catheter. The procedure was then repeated on the patient's RIGHT pulmonary artery, and with the use of a stiff Rosen wire, access was obtained into a distal branch of the RIGHT lower lobe pulmonary artery allowing placement of a 90/10 cm multi side-hole infusion catheter. A postprocedural fluoroscopic image was obtained to document final catheter positioning. The external catheter tubing was secured at the right thigh and the lytic therapy was initiated. The patient  tolerated the procedure well without immediate postprocedural complication. FINDINGS: Central pulmonary arteriogram demonstrates central pulmonary emboli at the BILATERAL pulmonary arterial bifurcations. Acquired pressure measurements: Main  pulmonary artery; 58/19, mean 35 (normal: < 25/10) Following the procedure, both infusion catheter tips terminate within the distal aspects of the bilateral lower lobe sub segmental pulmonary arteries. IMPRESSION: Successful initiation of BILATERAL catheter-directed pulmonary arterial lysis for submassive pulmonary embolism and RIGHT heart strain. PLAN: Lytic catheter infusion rates as described per order set. Vascular Interventional Radiology will follow the patient with anticipated catheter removal during rounds tomorrow. Roanna Banning, MD [facility] Radiology Electronically Signed   By: Roanna Banning M.D.   On: 01/30/2023 09:23   IR US Guide Vasc Access Right Result Date: 01/30/2023 INDICATION: Syncope with submassive PE. RIGHT heart strain RV LV ratio 2.4. POSITIVE troponins. EXAM: Procedures: 1. PULMONARY ARTERIOGRAPHY 2. PLACEMENT OF BILATERAL PULMONARY ARTERIAL LYTIC INFUSION CATHETERS COMPARISON:  Chest XR and CTA PE, 01/29/2023. MEDICATIONS: None ANESTHESIA/SEDATION: Moderate (conscious) sedation was employed during this procedure. A total of Versed 2 mg and Fentanyl 100 mcg was administered intravenously. Moderate Sedation Time: 55 minutes. [name] level of consciousness and vital signs were monitored continuously by radiology nursing throughout the procedure under my direct supervision. CONTRAST:  30mL OMNIPAQUE IOHEXOL 300 MG/ML SOLN, 40mL OMNIPAQUE IOHEXOL 300 MG/ML SOLN FLUOROSCOPY TIME:  Fluoroscopic dose; 423 mGy COMPLICATIONS: None immediate. TECHNIQUE: Informed written consent was obtained from the patient and/or patient's representative after a discussion of the risks, benefits and alternatives to  treatment. Questions regarding the procedure were encouraged and answered. A timeout was performed prior to the initiation of the procedure.  Ultrasound scanning was performed of the RIGHT groin and demonstrated patency of the common femoral vein. As such, the RIGHT common femoral vein was selected venous access. The RIGHT groin was prepped and draped in the usual sterile fashion, and a sterile drape was applied covering the operative field. Maximum barrier sterile technique with sterile gowns and gloves were used for the procedure. A timeout was performed prior to the initiation of the procedure. Local anesthesia was provided with 1% lidocaine. Under direct ultrasound guidance, the RIGHT common femoral vein was accessed with a micro puncture kit ultimately allowing placement of a 6 Fr 10 cm vascular sheath. Slightly cranial to this initial access, the RIGHT common femoral was again accessed with an additional micropuncture kit ultimately allowing placement of an additional 6 Fr 10 cm vascular sheath. Ultrasound images were saved for procedural documentation purposes. With the use of a Bentson wire, an angled pulmonary catheter was advanced into the main pulmonary artery and a limited central pulmonary arteriogram was performed. Pressure measurements were then obtained from the main pulmonary artery. A 5 Fr angled pigtail catheter was advanced into the distal branch of the LEFT lower lobe pulmonary artery. Limited contrast injection confirmed appropriate positioning. Over an exchange length Pollyann Kennedy, the catheter was exchanged for a 90/10 cm multi side-hole infusion catheter. The procedure was then repeated on the patient's RIGHT pulmonary artery, and with the use of a stiff Rosen wire, access was obtained into a distal branch of the RIGHT lower lobe pulmonary artery allowing placement of a 90/10 cm multi side-hole infusion catheter. A postprocedural fluoroscopic image was obtained to document final catheter  positioning. The external catheter tubing was secured at the right thigh and the lytic therapy was initiated. The patient tolerated the procedure well without immediate postprocedural complication. FINDINGS: Central pulmonary arteriogram demonstrates central pulmonary emboli at the BILATERAL pulmonary arterial bifurcations. Acquired pressure measurements: Main  pulmonary artery; 58/19, mean 35 (normal: < 25/10) Following the procedure, both infusion catheter tips terminate within the distal aspects of the bilateral lower lobe sub segmental pulmonary arteries. IMPRESSION: Successful initiation of BILATERAL catheter-directed pulmonary arterial lysis for submassive pulmonary embolism and RIGHT heart strain. PLAN: Lytic catheter infusion rates as described per order set. Vascular Interventional Radiology will follow the patient with anticipated catheter removal during rounds tomorrow. Roanna Banning, MD [facility] Radiology Electronically Signed   By: Roanna Banning M.D.   On: 01/30/2023 09:23   IR Angiogram Pulmonary Bilateral Selective Result Date: 01/30/2023 INDICATION: Syncope with submassive PE. RIGHT heart strain RV LV ratio 2.4. POSITIVE troponins. EXAM: Procedures: 1. PULMONARY ARTERIOGRAPHY 2. PLACEMENT OF BILATERAL PULMONARY ARTERIAL LYTIC INFUSION CATHETERS COMPARISON:  Chest XR and CTA PE, 01/29/2023. MEDICATIONS: None ANESTHESIA/SEDATION: Moderate (conscious) sedation was employed during this procedure. A total of Versed 2 mg and Fentanyl 100 mcg was administered intravenously. Moderate Sedation Time: 55 minutes. [name] level of consciousness and vital signs were monitored continuously by radiology nursing throughout the procedure under my direct supervision. CONTRAST:  30mL OMNIPAQUE IOHEXOL 300 MG/ML SOLN, 40mL OMNIPAQUE IOHEXOL 300 MG/ML SOLN FLUOROSCOPY TIME:  Fluoroscopic dose; 423 mGy COMPLICATIONS: None immediate. TECHNIQUE: Informed written  consent was obtained from the patient and/or patient's representative after a discussion of the risks, benefits and alternatives to treatment. Questions regarding the procedure were encouraged and answered. A timeout was performed prior to the initiation of the procedure. Ultrasound scanning was performed of the RIGHT groin and demonstrated patency of the common femoral vein. As such,  the RIGHT common femoral vein was selected venous access. The RIGHT groin was prepped and draped in the usual sterile fashion, and a sterile drape was applied covering the operative field. Maximum barrier sterile technique with sterile gowns and gloves were used for the procedure. A timeout was performed prior to the initiation of the procedure. Local anesthesia was provided with 1% lidocaine. Under direct ultrasound guidance, the RIGHT common femoral vein was accessed with a micro puncture kit ultimately allowing placement of a 6 Fr 10 cm vascular sheath. Slightly cranial to this initial access, the RIGHT common femoral was again accessed with an additional micropuncture kit ultimately allowing placement of an additional 6 Fr 10 cm vascular sheath. Ultrasound images were saved for procedural documentation purposes. With the use of a Bentson wire, an angled pulmonary catheter was advanced into the main pulmonary artery and a limited central pulmonary arteriogram was performed. Pressure measurements were then obtained from the main pulmonary artery. A 5 Fr angled pigtail catheter was advanced into the distal branch of the LEFT lower lobe pulmonary artery. Limited contrast injection confirmed appropriate positioning. Over an exchange length Pollyann Kennedy, the catheter was exchanged for a 90/10 cm multi side-hole infusion catheter. The procedure was then repeated on the patient's RIGHT pulmonary artery, and with the use of a stiff Rosen wire, access was obtained into a distal branch of the RIGHT lower lobe pulmonary artery allowing placement of a  90/10 cm multi side-hole infusion catheter. A postprocedural fluoroscopic image was obtained to document final catheter positioning. The external catheter tubing was secured at the right thigh and the lytic therapy was initiated. The patient tolerated the procedure well without immediate postprocedural complication. FINDINGS: Central pulmonary arteriogram demonstrates central pulmonary emboli at the BILATERAL pulmonary arterial bifurcations. Acquired pressure measurements: Main  pulmonary artery; 58/19, mean 35 (normal: < 25/10) Following the procedure, both infusion catheter tips terminate within the distal aspects of the bilateral lower lobe sub segmental pulmonary arteries. IMPRESSION: Successful initiation of BILATERAL catheter-directed pulmonary arterial lysis for submassive pulmonary embolism and RIGHT heart strain. PLAN: Lytic catheter infusion rates as described per order set. Vascular Interventional Radiology will follow the patient with anticipated catheter removal during rounds tomorrow. Roanna Banning, MD [facility]iology Specialists [facility] Radiology Electronically Signed   By: Roanna Banning M.D.   On: 01/30/2023 09:23   DG Chest Port 1 View Result Date: 01/30/2023 CLINICAL DATA:  Submassive PE. Thrombolysis. [id number] Post-operative state [id number] EXAM: PORTABLE CHEST 1 VIEW COMPARISON:  Chest XR and CTA chest, 01/29/2023. IR fluoroscopy, 01/29/2023. FINDINGS: Support lines; stable positioning of BILATERAL pulmonary arterial infusion catheters. Cardiomediastinal silhouette is within normal limits. Lungs are well inflated. No focal consolidation or mass. No pleural effusion or pneumothorax. No acute displaced fracture. IMPRESSION: 1. Stable positioning of BILATERAL pulmonary arterial infusion catheters. 2. No acute superimposed cardiopulmonary process. Electronically Signed   By: Roanna Banning M.D.   On: 01/30/2023 08:22   DG Abd 1 View Result Date: 01/30/2023 CLINICAL DATA:  Acute  abdominal and back pain EXAM: ABDOMEN - 1 VIEW COMPARISON:  Abdominal CT 12/21/2020 FINDINGS: Pulmonary artery catheters over the abdomen are unremarkable. Renal calculi, numerous on the right. Bowel gas pattern is normal. IMPRESSION: Normal bowel gas pattern. Unremarkable intra-abdominal position of pulmonary artery catheters. Electronically Signed   By: Tiburcio Pea M.D.   On: 01/30/2023 06:49   CT Angio Chest PE W and/or Wo Contrast Result Date: 01/29/2023 CLINICAL DATA:  Short of breath.  Elevated D-dimer. EXAM: CT  ANGIOGRAPHY CHEST WITH CONTRAST TECHNIQUE: Multidetector CT imaging of the chest was performed using the standard protocol during bolus administration of intravenous contrast. Multiplanar CT image reconstructions and MIPs were obtained to evaluate the vascular anatomy. RADIATION DOSE REDUCTION: This exam was performed according to the departmental dose-optimization program which includes automated exposure control, adjustment of the mA and/or kV according to patient size and/or use of iterative reconstruction technique. CONTRAST:  75mL OMNIPAQUE IOHEXOL 350 MG/ML SOLN COMPARISON:  01/02/2022. FINDINGS: Cardiovascular: There are multiple bilateral pulmonary emboli. Emboli noted in the main pulmonary arteries extending into all segmental branches, slightly higher burden on the right than the left. A thin saddle embolus crosses from the left to right pulmonary arteries. RV LV ratio is 2.4, significantly elevated consistent with right heart strain. Heart is normal in overall size. No pericardial effusion. Aorta is normal in caliber. No dissection or atherosclerosis. Mediastinum/Nodes: No neck base, mediastinal or hilar masses. No enlarged lymph nodes. Trachea esophagus are unremarkable. Lungs/Pleura: Mild linear opacities, mostly in the lower lungs, consistent with atelectasis. No lung consolidation to suggest infarction. No pulmonary edema. No pleural effusion or pneumothorax. Upper Abdomen: No  acute abnormality. Musculoskeletal: No acute fracture. No bone lesion. No chest wall mass. Review of the MIP images confirms the above findings. IMPRESSION: 1. Positive for acute bilateral pulmonary emboli. Multiple emboli in the main pulmonary arteries extending into all segmental branches. Thin saddle embolus. 2. Positive for right heart strain (RV/LV Ratio = 2.4 ) consistent with at least submassive (intermediate risk) PE. The presence of right heart strain has been associated with an increased risk of morbidity and mortality. Please refer to the "Code PE Focused" order set in EPIC. Critical Value/emergent results were called by telephone at the time of interpretation on 01/29/2023 at 5:09 pm to provider [facility] , who verbally acknowledged these results. Electronically Signed   By: Amie Portland M.D.   On: 01/29/2023 17:09   DG Chest 2 View Result Date: 01/29/2023 CLINICAL DATA:  Chest pain. EXAM: CHEST - 2 VIEW COMPARISON:  04/07/2022 FINDINGS: Lungs are hypoinflated without focal airspace consolidation or effusion. Cardiomediastinal silhouette and remainder the exam is unchanged. IMPRESSION: Hypoinflation without acute cardiopulmonary disease. Electronically Signed   By: Elberta Fortis M.D.   On: 01/29/2023 15:18   IR INFUSION THROMBOL ARTERIAL INITIAL (MS) RESULT     INDICATION: History of sub massive pulmonary embolism, post initiation of bilateral pulmonary arterial thrombolysis on 01/29/2023. Patient has completed prescribed course of bilateral pulmonary arterial lytic infusion and presents today for repeat pulmonary arterial pressure measurements prior to infusion catheter removal. EXAM: PULMONARY ARTERIAL PRESSURE MEASUREMENT AND INFUSION CATHETER(S) REMOVAL COMPARISON:  IR fluoroscopy, 01/29/2023. Chest XR earlier same day. CTA PE, 01/29/2023 MEDICATIONS: None CONTRAST:  None FLUOROSCOPY TIME:  None COMPLICATIONS: None immediate. TECHNIQUE: The patient was positioned supine on there hospital bed.  Review of this morning's chest radiograph demonstrates unchanged positioning of bilateral pulmonary arterial infusion catheters with tip of the right-sided pulmonary arterial infusion catheter approximately 5-10 cm peripheral to the expected outflow of the main pulmonary artery. As such, the right pulmonary arterial catheter's infusion wire was removed and the multi side-hole infusion catheter was retracted to the estimated location of the main pulmonary artery. Pressure measurements were acquired from this location. At this point, the procedure was terminated. All wires, catheters and sheaths were removed from the patient. Hemostasis was achieved at the right groin access site with manual compression. A dressing was placed. The patient tolerated the procedure  well without immediate postprocedural complication. FINDINGS: Acquired pressure measurements as follows: Preprocedural main pulmonary artery-58/19; mean-35 (normal: < 25/10) Postprocedural main pulmonary artery-31/14; mean-20 IMPRESSION: Successful BILATERAL catheter directed pulmonary arterial thrombolysis with reduction (15 mmHg) in pressure within the main pulmonary artery. Roanna Banning, MD [facility] Radiology Electronically Signed   By: Roanna Banning M.D.   On: 01/30/2023 09:29   IR Angiogram Selective Each Additional Vessel RESULT     INDICATION: History of sub massive pulmonary embolism, post initiation of bilateral pulmonary arterial thrombolysis on 01/29/2023. Patient has completed prescribed course of bilateral pulmonary arterial lytic infusion and presents today for repeat pulmonary arterial pressure measurements prior to infusion catheter removal. EXAM: PULMONARY ARTERIAL PRESSURE MEASUREMENT AND INFUSION CATHETER(S) REMOVAL COMPARISON:  IR fluoroscopy, 01/29/2023. Chest XR earlier same day. CTA PE, 01/29/2023 MEDICATIONS: None CONTRAST:  None FLUOROSCOPY TIME:  None COMPLICATIONS: None immediate.  TECHNIQUE: The patient was positioned supine on there hospital bed. Review of this morning's chest radiograph demonstrates unchanged positioning of bilateral pulmonary arterial infusion catheters with tip of the right-sided pulmonary arterial infusion catheter approximately 5-10 cm peripheral to the expected outflow of the main pulmonary artery. As such, the right pulmonary arterial catheter's infusion wire was removed and the multi side-hole infusion catheter was retracted to the estimated location of the main pulmonary artery. Pressure measurements were acquired from this location. At this point, the procedure was terminated. All wires, catheters and sheaths were removed from the patient. Hemostasis was achieved at the right groin access site with manual compression. A dressing was placed. The patient tolerated the procedure well without immediate postprocedural complication. FINDINGS: Acquired pressure measurements as follows: Preprocedural main pulmonary artery-58/19; mean-35 (normal: < 25/10) Postprocedural main pulmonary artery-31/14; mean-20 IMPRESSION: Successful BILATERAL catheter directed pulmonary arterial thrombolysis with reduction (15 mmHg) in pressure within the main pulmonary artery. Roanna Banning, MD [facility] Radiology Electronically Signed   By: Roanna Banning M.D.   On: 01/30/2023 09:29   IR Angiogram Selective Each Additional Vessel RESULT     INDICATION: History of sub massive pulmonary embolism, post initiation of bilateral pulmonary arterial thrombolysis on 01/29/2023. Patient has completed prescribed course of bilateral pulmonary arterial lytic infusion and presents today for repeat pulmonary arterial pressure measurements prior to infusion catheter removal. EXAM: PULMONARY ARTERIAL PRESSURE MEASUREMENT AND INFUSION CATHETER(S) REMOVAL COMPARISON:  IR fluoroscopy, 01/29/2023. Chest XR earlier same day. CTA PE, 01/29/2023 MEDICATIONS: None  CONTRAST:  None FLUOROSCOPY TIME:  None COMPLICATIONS: None immediate. TECHNIQUE: The patient was positioned supine on there hospital bed. Review of this morning's chest radiograph demonstrates unchanged positioning of bilateral pulmonary arterial infusion catheters with tip of the right-sided pulmonary arterial infusion catheter approximately 5-10 cm peripheral to the expected outflow of the main pulmonary artery. As such, the right pulmonary arterial catheter's infusion wire was removed and the multi side-hole infusion catheter was retracted to the estimated location of the main pulmonary artery. Pressure measurements were acquired from this location. At this point, the procedure was terminated. All wires, catheters and sheaths were removed from the patient. Hemostasis was achieved at the right groin access site with manual compression. A dressing was placed. The patient tolerated the procedure well without immediate postprocedural complication. FINDINGS: Acquired pressure measurements as follows: Preprocedural main pulmonary artery-58/19; mean-35 (normal: < 25/10) Postprocedural main pulmonary artery-31/14; mean-20 IMPRESSION: Successful BILATERAL catheter directed pulmonary arterial thrombolysis with reduction (15 mmHg) in pressure within the main pulmonary artery. Roanna Banning, MD [facility] Radiology Electronically Signed   By: Roanna Banning M.D.   On: 01/30/2023 09:29    Labs:  CBC: Recent Labs    01/29/23 2041 01/30/23 0019 01/30/23 0428 01/30/23 1233  WBC 20.0* 19.3* 18.4* 13.7*  HGB 15.6 14.6 15.2 14.3  HCT 45.7 43.2 45.4 42.7  PLT 208 175 170 156    COAGS: Recent Labs    01/29/23 2041  INR 1.3*  APTT 108*    BMP: Recent Labs    04/07/22 1400 01/29/23 1510 01/29/23 1521 01/29/23 1526 01/30/23 0428  NA 140 138 142 141 140  K 3.8 4.1 4.1 4.2 3.9  CL 105 103  --  106 110  CO2 24 22  --   --  19*  GLUCOSE 160* 173*  --  169*  144*  BUN 11 11  --  15 11  CALCIUM 8.9 9.8  --   --  8.6*  CREATININE 1.03 1.19  --  1.10 0.98  GFRNONAA >60 >60  --   --  >60    LIVER FUNCTION TESTS: Recent Labs    01/29/23 1510  BILITOT 1.0  AST 53*  ALT 51*  ALKPHOS 75  PROT 7.7  ALBUMIN 3.7    Assessment and Plan:  53 y/o M admitted 01/29/23 with saddle PE and right heart strain s/p catheter directed thrombolysis later that same day in IR with Dr. Milford Cage seen today for post procedure follow up.  Patient denies dyspnea, chest pain, dizziness or significant headache. Remains hypotensive but tachycardia has improved, on 2L oxygen saturating 93-95%. Right greater saphenous vein puncture site unremarkable, no bleeding concerns per RN.  Pre procedure pulmonary arterial pressures 58/19 (mean = 35), post procedure pulmonary artery pressures from right catheter 32/14 (mean = 20).   Plan: - May ambulate with assistance starting now - Change right groin dressing every day until healed. If bleeding occurs lay patient flat and hold firm pressure x 20 minutes then redress. May shower, do not submerge site x 7 days.  Further plans per primary team. IR remains available as needed, please call with questions or concerns.  Electronically Signed: Villa Herb, PA-C 01/30/2023, 3:27 PM   I spent a total of 15 Minutes at the the patient's bedside AND on the patient's hospital floor or unit, greater than 50% of which was counseling/coordinating care for pulmonary embolism.

## 2023-01-30 NOTE — Progress Notes (Signed)
Echocardiogram 2D Echocardiogram has been performed.  Lacharles Altschuler N Jacquelin Krajewski,RDCS 01/30/2023, 1:54 PM

## 2023-01-30 NOTE — Progress Notes (Addendum)
PHARMACY - ANTICOAGULATION CONSULT NOTE  Pharmacy Consult for heparin Indication: pulmonary embolus undergoing lysis  Labs: Recent Labs    01/29/23 1510 01/29/23 1521 01/29/23 1526 01/29/23 1649 01/29/23 1807 01/29/23 2041 01/30/23 0019 01/30/23 0428  HGB 16.9   < > 17.3*  --   --  15.6 14.6 15.2  HCT 50.2   < > 51.0  --   --  45.7 43.2 45.4  PLT 238  --   --   --   --  208 175 170  APTT  --   --   --   --   --  108*  --   --   LABPROT  --   --   --   --   --  16.0*  --   --   INR  --   --   --   --   --  1.3*  --   --   HEPARINUNFRC  --   --   --   --   --  0.71* 0.65 0.74*  CREATININE 1.19  --  1.10  --   --   --   --  0.98  TROPONINIHS 326*  --   --  467* 691* 963*  --   --    < > = values in this interval not displayed.   Assessment: 53yo male now slightly supratherapeutic on heparin after one level at upper end of goal; no infusion issues per RN though pt does c/o sudden onset of pain concerning for RPB though Hgb and fibrinogen stable, KUB being done now.  Goal of Therapy:  Heparin level 0.3-0.7 units/ml   Plan:  Decrease heparin infusion by 1-2 units/kg/hr to 1900 units/hr. Check level with next scheduled labs.   Vernard Gambles, PharmD, BCPS 01/30/2023 5:18 AM

## 2023-01-30 NOTE — Progress Notes (Addendum)
PCCM called to bedside for patient w/ worsening abd pain. No response to tylenol but some response to norco. KUB ordered/pending. On arrival patient states having left sided flank pain and lower abd pain radiating down to groin. He states this feels like when he has a kidney stone.   Plan: -CT renal stone study -iv fluids -toradol x1; prn dilaudid -check UA -kub pending  JD Anselm Lis [facility]vis [facility] 01/30/2023, 6:17 AM  Please see [email or web address] for pager details.  From 7A-7P if no response, please call [phone]. After hours, please call ELink [phone].

## 2023-01-30 NOTE — Progress Notes (Addendum)
ANTICOAGULATION CONSULT NOTE - Initial Consult  Pharmacy Consult for Heparin Indication: pulmonary embolus   Patient Measurements: Height: 6\' 1"  (185.4 cm) Weight: (!) 149.5 kg (329 lb 9.4 oz) IBW/kg (Calculated) : 79.9 Heparin Dosing Weight: 115.6  Vital Signs: Temp: 99.2 F (37.3 C) (01/20 0000) Temp Source: Oral (01/20 0000) BP: 120/87 (01/20 0145) Pulse Rate: 107 (01/20 0145)  Labs: Recent Labs    01/29/23 1510 01/29/23 1521 01/29/23 1526 01/29/23 1649 01/29/23 1807 01/29/23 2041 01/30/23 0019  HGB 16.9   < > 17.3*  --   --  15.6 14.6  HCT 50.2   < > 51.0  --   --  45.7 43.2  PLT 238  --   --   --   --  208 175  APTT  --   --   --   --   --  108*  --   LABPROT  --   --   --   --   --  16.0*  --   INR  --   --   --   --   --  1.3*  --   HEPARINUNFRC  --   --   --   --   --  0.71* 0.65  CREATININE 1.19  --  1.10  --   --   --   --   TROPONINIHS 326*  --   --  467* 691* 963*  --    < > = values in this interval not displayed.    Estimated Creatinine Clearance: 122.4 mL/min (by C-G formula based on SCr of 1.1 mg/dL).  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 Patient is presenting with syncopal episode. Heparin per pharmacy consult placed for pulmonary embolus.  CTA PE acute bilateral pulmonary emboli. Multiple emboli in the main pulmonary arteries extending into all segmental branches. Thin saddle embolus. W/ RHS. Patient is not on anticoagulation prior to arrival.  1/20 AM: Patient s/p catheter directed lysis in IR started at 2030 on 1/19 and to continue for 12 hours (total 24mg  of tPA). Heparin level 0.65 on 2100 units/hr (therapeutic). The 0.71 drawn 3 hours after bolus (not true heparin level). Per RN, no issues with the heparin infusion running continuously or signs/symptoms of bleeding. CBC stable and fibrinogen 377   Goal of Therapy:  Heparin level 0.3-0.7  units/ml Monitor platelets by anticoagulation protocol: Yes   Plan:  Continue heparin infusion at 2100 units/hr Check anti-Xa level every 6 hours and daily while on heparin (next draw ~0600) Continue to monitor H&H and platelets  Arabella Merles, PharmD. Clinical Pharmacist 01/30/2023 2:28 AM

## 2023-01-30 NOTE — Progress Notes (Signed)
PHARMACY - ANTICOAGULATION CONSULT NOTE  Pharmacy Consult for heparin  Indication: pulmonary embolus  Allergies  Allergen Reactions   Lisinopril     unknown   Amoxicillin Rash   Penicillins Other (See Comments)    Childhood reaction    Patient Measurements: Height: 6\' 1"  (185.4 cm) Weight: (!) 149.5 kg (329 lb 9.4 oz) IBW/kg (Calculated) : 79.9 Heparin Dosing Weight: 114.8kg   Vital Signs: Temp: 98.2 F (36.8 C) (01/20 0800) Temp Source: Oral (01/20 0800) BP: 101/72 (01/20 1230) Pulse Rate: 82 (01/20 1230)  Labs: Recent Labs    01/29/23 1510 01/29/23 1521 01/29/23 1526 01/29/23 1649 01/29/23 1807 01/29/23 2041 01/30/23 0019 01/30/23 0428  HGB 16.9   < > 17.3*  --   --  15.6 14.6 15.2  HCT 50.2   < > 51.0  --   --  45.7 43.2 45.4  PLT 238  --   --   --   --  208 175 170  APTT  --   --   --   --   --  108*  --   --   LABPROT  --   --   --   --   --  16.0*  --   --   INR  --   --   --   --   --  1.3*  --   --   HEPARINUNFRC  --   --   --   --   --  0.71* 0.65 0.74*  CREATININE 1.19  --  1.10  --   --   --   --  0.98  TROPONINIHS 326*  --   --    < > 691* 963*  --  537*   < > = values in this interval not displayed.    Estimated Creatinine Clearance: 137.4 mL/min (by C-G formula based on SCr of 0.98 mg/dL).   Medical History: Past Medical History:  Diagnosis Date   Cancer (HCC)    basal cell skin cancer   Cellulitis of right leg 09/17/2013   COVID-19    Diabetes mellitus without complication (HCC)    DVT (deep venous thrombosis) (HCC)    Dyspnea    due to PE   History of kidney stones    Morbid obesity (HCC)    Pulmonary embolism (HCC) 04/2020    Assessment: Patient admitted with PE s/p catheter directed thrombolysis which ended this morning 08:00 on 1/20. HgB 14.3 and PLTs 156. Heparin level therapeutic at 0.60 while on heparin 1900u/hr.   Goal of Therapy:  Heparin level 0.3-0.7 units/ml Monitor platelets by anticoagulation protocol: Yes    Plan:  Continue heparin infusion at 1900 units/hr Check anti-Xa level in 6 hours and daily while on heparin Continue to monitor H&H and platelets  Estill Batten, PharmD, BCCCP  01/30/2023,12:59 PM

## 2023-01-30 NOTE — Progress Notes (Signed)
PHARMACY - ANTICOAGULATION CONSULT NOTE  Pharmacy Consult for heparin  Indication: pulmonary embolus  Allergies  Allergen Reactions   Lisinopril     unknown   Amoxicillin Rash   Penicillins Other (See Comments)    Childhood reaction    Patient Measurements: Height: 6\' 1"  (185.4 cm) Weight: (!) 149.5 kg (329 lb 9.4 oz) IBW/kg (Calculated) : 79.9 Heparin Dosing Weight: 114.8kg   Vital Signs: Temp: 98.2 F (36.8 C) (01/20 1555) Temp Source: Oral (01/20 1555) BP: 180/73 (01/20 1800) Pulse Rate: 85 (01/20 1800)  Labs: Recent Labs    01/29/23 1510 01/29/23 1510 01/29/23 1521 01/29/23 1526 01/29/23 1649 01/29/23 1807 01/29/23 2041 01/30/23 0019 01/30/23 0428 01/30/23 1233 01/30/23 1748  HGB 16.9  --    < > 17.3*  --   --  15.6 14.6 15.2 14.3  --   HCT 50.2  --    < > 51.0  --   --  45.7 43.2 45.4 42.7  --   PLT 238  --   --   --   --   --  208 175 170 156  --   APTT  --   --   --   --   --   --  108*  --   --   --   --   LABPROT  --   --   --   --   --   --  16.0*  --   --   --   --   INR  --   --   --   --   --   --  1.3*  --   --   --   --   HEPARINUNFRC  --    < >  --   --   --   --  0.71* 0.65 0.74* 0.60 0.51  CREATININE 1.19  --   --  1.10  --   --   --   --  0.98  --   --   TROPONINIHS 326*  --   --   --    < > 691* 963*  --  537*  --   --    < > = values in this interval not displayed.    Estimated Creatinine Clearance: 137.4 mL/min (by C-G formula based on SCr of 0.98 mg/dL).   Medical History: Past Medical History:  Diagnosis Date   Cancer (HCC)    basal cell skin cancer   Cellulitis of right leg 09/17/2013   COVID-19    Diabetes mellitus without complication (HCC)    DVT (deep venous thrombosis) (HCC)    Dyspnea    due to PE   History of kidney stones    Morbid obesity (HCC)    Pulmonary embolism (HCC) 04/2020    Assessment: Patient admitted with PE s/p catheter directed thrombolysis which ended this morning 08:00 on 1/20. HgB 14.3 and PLTs  156. Heparin level therapeutic at 0.60 while on heparin 1900u/hr.   1/20 PM update: heparin level 0.51 therapeutic on 1900 units/hr.  No issues noted.    Goal of Therapy:  Heparin level 0.3-0.7 units/ml Monitor platelets by anticoagulation protocol: Yes   Plan:  Continue heparin infusion at 1900 units/hr Check anti-Xa level daily while on heparin Continue to monitor H&H and platelets F/u transition to DOAC (can use copay card)  Trixie Rude, PharmD Clinical Pharmacist 01/30/2023  7:03 PM

## 2023-01-30 NOTE — Telephone Encounter (Signed)
Patient Product/process development scientist completed.    The patient is insured through [facility]. Patient has [employer], may use a copay card, and/or apply for patient assistance if available.    Ran test claim for Eliquis 5 mg and the current 30 day co-pay is $30.00.  Ran test claim for Xarelto 20 mg and the current 30 day co-pay is $30.00.  This test claim was processed through [facility]- copay amounts may vary at other pharmacies due to pharmacy/plan contracts, or as the patient moves through the different stages of their insurance plan.     Nicholas [name], CPHT Pharmacy Technician III Certified Patient Advocate [facility] Patient Advocate Team Direct Number: [phone]  Fax: [phone]

## 2023-01-30 NOTE — Progress Notes (Signed)
Patient complains of tenderness to touch on LEFT lower abdomen and both testicles. Right femoral site is soft and no pain with touch. Pulses palpable. Patient had not voided since admission to unit. Bladder scan showed >600cc. Patient states, "it feels like a kidney stone. I've had them before."  Indwelling catheter placement attempted per orders. With attempt, patient in excruciating pain and asked RN to stop. Attempt not successful. Patient was able to be log rolled to the side and voided 800cc in urinal. Tenderness did ease up per patient. Vidal Schwalbe, RN Pola Corn) made aware. Pola Corn, MD made aware. Verbal to get morning labs now to see Hemoglobin. Patient resting at this time.

## 2023-01-30 NOTE — Progress Notes (Signed)
Nicholas [name] and myself entered into the room. Confirmed name and DOB. Dressing was removed from right groin, saline was stopped and ART line cable was connected. Left PAP 31/14 (20) and right PAP 32/14 (20). Catheters and sheaths were removed from the right groin and manual pressure was held. Patients groin was reviewed with the RN and was instructed to lay flat for 3 hours. Quik clot was applied. All questions were encouraged and answered.   Earlie Server RT(R).

## 2023-01-30 NOTE — Progress Notes (Signed)
R femoral sheath removed by IR staff at bedside, quickclot applied. Pt tolerated well. RN instructed on flat bedrest for 3 hours.

## 2023-01-30 NOTE — Progress Notes (Addendum)
NAME:  Nicholas [name], MRN:  [id number], DOB:  1970/02/12, LOS: 1 ADMISSION DATE:  01/29/2023, CONSULTATION DATE:  01/29/2023 REFERRING MD:  Dr. Laveda Norman - EDP, CHIEF COMPLAINT:  Submassive PE   History of Present Illness:  Nicholas [name] is a 53 y.o. with a past medical history of PE and DVT (suspected to be secondary to COVID-19), prior basal cell carcinoma, type 2 diabetes, and morbid obesity who presented to the ED 1/19 after experiencing syncopal episode.  Further history reveals patient was also short of breath with chest pressure.  On arrival to ED, patient was tachypneic, tachycardic, and mildly hypertensive.  Given clinical concern for PE, patient was taken for CTA chest which revealed acute submassive PE with RV LV ratio 2.4.  Lab work at that time significant for: troponin 326 > 467, lactic 2.9 > 1.9, WBC 22.2.  PCCM consulted for further management and admission given need for mechanical thrombectomy for submassive PE.   Pertinent  Medical History  Prior PE and DVT felt secondary to COVID-19 anticoagulated with Xarelto x 1 year then stopped, prior basal cell carcinoma, type 2 diabetes, and morbid obesity  Significant Hospital Events: Including procedures, antibiotic start and stop dates in addition to other pertinent events   1/19 presented after syncopal episode, found to have submassive saddle PE.  Underwent catheter-directed thrombolysis (6 PM).  Admitted to 4N ICU. 1/20 thrombolytic infusion ongoing.  On heparin gtt.  Complains of back pain and pain in testes.  CT renal stone study shows L UPJ stone with associated hydronephrosis.  Interim History / Subjective:  Seen lying in bed with ongoing complaints of shortness of breath but denies any chest discomfort  Objective   Blood pressure 126/87, pulse 87, temperature 97.6 F (36.4 C), temperature source Oral, resp. rate (!) 22, height 6\' 1"  (1.854 m), weight (!) 149.5 kg, SpO2 96%. PAP: (58)/(19) 58/19      Intake/Output Summary  (Last 24 hours) at 01/30/2023 0714 Last data filed at 01/30/2023 0600 Gross per 24 hour  Intake 900.85 ml  Output 1300 ml  Net -399.15 ml   Filed Weights   01/29/23 1430 01/29/23 2020 01/30/23 0504  Weight: (!) 152.4 kg (!) 149.5 kg (!) 149.5 kg    Examination: General: morbidly obese, middle-aged male lying in bed in no acute distress HEENT: [place]/AT, MM pink/moist, PERRL,  Neuro: Oriented x 3, nonfocal CV: s1s2 regular rate and rhythm, no murmur, rubs, or gallops,  PULM: Clear to auscultation bilaterally, no increased work of breathing, no added breath sounds GI: soft, bowel sounds active in all 4 quadrants, non-tender, non-distended Extremities: warm/dry, no edema  GU: bilateral testicular tenderness without swelling or discoloration. 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  Syncope prior to admission w/lab work indicating RV stress including high-sensitivity troponin 326 > 467 and positive lactic acid (2.9) Previous history of PE: shortly after suffering COVID-19, anticoagulated with Xarelto x 1 year IR assistance appreciated. Continue systemic heparin. Obtain echocardiogram. No absolute or relative contraindications to tPA if needed. Will need lifelong anticoagulation moving forward.  L UPJ stone with associated hydronephrosis Consult Urology  Essential hypertension Hyperlipidemia Home medications include Cozaar and Crestor. Restart home medications today.  Type 2 diabetes Hold home metformin. Sliding scale insulin coverage for now.  Accuchecks q 4 hr.  Glucose target  150.  Back pain and testicular pain Follow up CT renal stone study.   Best Practice (right click and "Reselect all SmartList Selections" daily)   Diet/type: NPO DVT prophylaxis systemic dose LMWH Pressure ulcer(s): N/A GI prophylaxis:  PPI Lines: N/A Foley:  N/A Code Status:  full code Last date of multidisciplinary goals of care discussion: Aggressive interventions, update patient and family daily   Labs   CBC: Recent Labs  Lab 01/29/23 1510 01/29/23 1521 01/29/23 1526 01/29/23 2041 01/30/23 0019 01/30/23 0428  WBC 22.2*  --   --  20.0* 19.3* 18.4*  NEUTROABS 18.0*  --   --   --   --   --   HGB 16.9 17.0 17.3* 15.6 14.6 15.2  HCT 50.2 50.0 51.0 45.7 43.2 45.4  MCV 92.1  --   --  89.8 90.8 90.8  PLT 238  --   --  208 175 170    Basic Metabolic Panel: Recent Labs  Lab 01/29/23 1510 01/29/23 1521 01/29/23 1526 01/30/23 0428  NA 138 142 141 140  K 4.1 4.1 4.2 3.9  CL 103  --  106 110  CO2 22  --   --  19*  GLUCOSE 173*  --  169* 144*  BUN 11  --  15 11  CREATININE 1.19  --  1.10 0.98  CALCIUM 9.8  --   --  8.6*  MG  --   --   --  2.0  PHOS  --   --   --  2.8   GFR: Estimated Creatinine Clearance: 137.4 mL/min (by C-G formula based on SCr of 0.98 mg/dL). Recent Labs  Lab 01/29/23 1510 01/29/23 1522 01/29/23 1707 01/29/23 2041 01/30/23 0019 01/30/23 0428  WBC 22.2*  --   --  20.0* 19.3* 18.4*  LATICACIDVEN  --  2.9* 1.9  --   --   --     Liver Function Tests: Recent Labs  Lab 01/29/23 1510  AST 53*  ALT 51*  ALKPHOS 75  BILITOT 1.0  PROT 7.7  ALBUMIN 3.7   No results for input(s): "LIPASE", "AMYLASE" in the last 168 hours. No results for input(s): "AMMONIA" in the last 168 hours.  ABG    Component Value Date/Time   PHART 7.435 06/27/2013 0910   PCO2ART 38.5 06/27/2013 0910   PO2ART 78.8 (L) 06/27/2013 0910   HCO3 24.7 01/29/2023 1521   TCO2 27 01/29/2023 1526   O2SAT 48 01/29/2023 1521     Coagulation Profile: Recent Labs  Lab 01/29/23 2041  INR 1.3*    Cardiac Enzymes: No results for input(s): "CKTOTAL", "CKMB", "CKMBINDEX", "TROPONINI" in the last 168 hours.  HbA1C: Hgb A1c MFr Bld  Date/Time Value Ref Range Status  01/29/2023 06:07 PM 6.6 (H) 4.8 - 5.6 %  Final    Comment:    (NOTE) Pre diabetes:          5.7%-6.4%  Diabetes:              >6.4%  Glycemic control for   <7.0% adults with diabetes   04/17/2020 10:03 PM 7.2 (H) 4.8 - 5.6 % Final    Comment:    (NOTE) Pre diabetes:          5.7%-6.4%  Diabetes:              >6.4%  Glycemic control for   <7.0% adults with diabetes     CBG: Recent Labs  Lab 01/29/23 2034 01/29/23 2318 01/30/23 0309  GLUCAP 117* 119* 141*    Review of Systems:   Please see the history of present illness. All other systems reviewed and are negative   Past Medical History:  He,  has a past medical history of Cancer (HCC), Cellulitis of right leg (09/17/2013), COVID-19, Diabetes mellitus without complication (HCC), DVT (deep venous thrombosis) (HCC), Dyspnea, History of kidney stones, Morbid obesity (HCC), and Pulmonary embolism (HCC) (04/2020).   Surgical History:   Past Surgical History:  Procedure Laterality Date   CLAVICLE HARDWARE REMOVAL Left 2007   CYSTOSCOPY/URETEROSCOPY/HOLMIUM LASER/STENT PLACEMENT Right 12/31/2020   Procedure: CYSTOSCOPY/URETEROSCOPY/HOLMIUM LASER/STENT PLACEMENT;  Surgeon: Orson Ape, MD;  Location: [facility];  Service: Urology;  Laterality: Right;   EXTRACORPOREAL SHOCK WAVE LITHOTRIPSY Right 05/16/2019   Procedure: EXTRACORPOREAL SHOCK WAVE LITHOTRIPSY (ESWL);  Surgeon: Orson Ape, MD;  Location: [facility];  Service: Urology;  Laterality: Right;   EXTRACORPOREAL SHOCK WAVE LITHOTRIPSY Right 07/18/2019   Procedure: EXTRACORPOREAL SHOCK WAVE LITHOTRIPSY (ESWL);  Surgeon: Orson Ape, MD;  Location: [facility];  Service: Urology;  Laterality: Right;   EXTRACORPOREAL SHOCK WAVE LITHOTRIPSY Left 10/24/2019   Procedure: EXTRACORPOREAL SHOCK WAVE LITHOTRIPSY (ESWL);  Surgeon: Orson Ape, MD;  Location: [facility];  Service: Urology;  Laterality: Left;   FRACTURE SURGERY Left 2007   "shattered clavicle; broke it in 9 places"     Social History:   reports  that he has never smoked. He has quit using smokeless tobacco.  His smokeless tobacco use included snuff. He reports that he does not currently use alcohol. He reports that he does not use drugs.   Family History:  His family history includes Cancer in an other family member; Heart attack in his maternal grandfather and paternal grandfather; Heart disease in an other family member; Kidney disease in his father; Leukemia in his mother; Stroke in an other family member.   Allergies Allergies  Allergen Reactions   Lisinopril     unknown   Amoxicillin Rash   Penicillins Other (See Comments)    Childhood reaction     Home Medications  Prior to Admission medications   Medication Sig Start Date End Date Taking? Authorizing Provider  Cholecalciferol (VITAMIN D-3) 25 MCG (1000 UT) CAPS Take 1 capsule by mouth daily at 6 (six) AM.    [provider]  losartan (COZAAR) 25 MG tablet Take 12.5 mg by mouth at bedtime. 08/28/20   [provider]  meloxicam (MOBIC) 7.5 MG tablet Take 7.5 mg by mouth 2 (two) times daily. Patient not taking: Reported on 04/26/2022    [provider]  metFORMIN (GLUCOPHAGE) 500 MG tablet Take 1,000 mg by mouth 2 (two) times daily. 02/05/20   [provider]  Meth-Hyo-M Bl-Na Phos-Ph Sal (URIBEL) 118 MG CAPS Take 1 capsule (118 mg total) by mouth every 6 (six) hours as needed. Patient not taking: Reported on 04/26/2022 12/31/20   Orson Ape, MD  oxyCODONE-acetaminophen (PERCOCET) 5-325 MG tablet Take 1 tablet by mouth every 4 (four) hours as needed for severe pain. Patient not taking: Reported on 04/26/2022 01/02/22   Minna Antis, MD  rivaroxaban (XARELTO) 20 MG TABS tablet Take 20 mg by mouth daily with supper. Patient not taking: Reported on 04/26/2022    [provider]  rosuvastatin (CRESTOR) 5 MG tablet Take 5 mg by mouth at bedtime. 08/28/20   [provider]  RYBELSUS 7 MG TABS Take 7 mg by mouth every  morning. 04/16/20   [provider]  Critical care time: 30 minutes   CRITICAL CARE Performed by: Marcelle Smiling  Total critical care time: 30 minutes  Critical care time was exclusive of separately billable procedures and treating other patients.  Critical care was necessary to treat or prevent imminent or life-threatening deterioration.  Critical care was time spent personally by me on the following activities: development of treatment plan with patient and/or surrogate as well as nursing, discussions with consultants, evaluation of patient's response to treatment, examination of patient, obtaining history from patient or surrogate, ordering and performing treatments and interventions, ordering and review of laboratory studies, ordering and review of radiographic studies, pulse oximetry and re-evaluation of patient's condition.  Marcelle Smiling, MD Board Certified by the ABIM, Pulmonary Diseases & Critical Care Medicine  [facility] Personal contact information can be found on Amion  If no contact or response made please call [phone] 01/30/2023, 9:06 AM

## 2023-01-30 NOTE — Progress Notes (Addendum)
Patient now complains of mid back pain and cannot get comfortable despite ordered Tylenol given. Tye Savoy, MD) made aware. Awaiting orders.

## 2023-01-30 NOTE — Plan of Care (Signed)
  Problem: Education: Goal: Knowledge of General Education information will improve Description: Including pain rating scale, medication(s)/side effects and non-pharmacologic comfort measures Outcome: Progressing   Problem: Health Behavior/Discharge Planning: Goal: Ability to manage health-related needs will improve Outcome: Progressing   Problem: Clinical Measurements: Goal: Respiratory complications will improve Outcome: Progressing Goal: Cardiovascular complication will be avoided Outcome: Progressing   Problem: Activity: Goal: Risk for activity intolerance will decrease Outcome: Progressing   Problem: Nutrition: Goal: Adequate nutrition will be maintained Outcome: Progressing   Problem: Coping: Goal: Level of anxiety will decrease Outcome: Progressing   Problem: Elimination: Goal: Will not experience complications related to urinary retention Outcome: Progressing   Problem: Pain Managment: Goal: General experience of comfort will improve and/or be controlled Outcome: Progressing   Problem: Safety: Goal: Ability to remain free from injury will improve Outcome: Progressing   Problem: Skin Integrity: Goal: Risk for impaired skin integrity will decrease Outcome: Progressing

## 2023-01-30 NOTE — Progress Notes (Addendum)
eLink Physician-Brief Progress Note Patient Name: JOVONI WOOTTEN DOB: 03-Apr-1970 MRN: [id number]   Date of Service  01/30/2023  HPI/Events of Note  Unsuccessful with placing foley for bladder scan showing > 600, testicles and abd tender to touch, He passed urine.  LLQ abdominal pain, new  and  pt says it feels like kidney stones    c/o excruciating pain.    Discussed with RN.  Camera: VS stable. On heparin drip.  S/p ekos, sheet on right groin, tpa infusing.  Heparin drip via PIV HR , MAP good, no tachycardia.    eICU Interventions  Stat Hg/CBC  to see any dropping, if so need CT pelvis/abdomen to r/o RPH. Unlikely since he is not tachycardic.  NPO Tylenol 650 oral with sips.  Call back if worsening and or Hg dropping.       Intervention Category Intermediate Interventions: Other:  Ranee Gosselin 01/30/2023, 4:05 AM  05:09 Now has  mid lower back. Tender LLQ.  Hg stable at 15,2, VS stable. Not suspecting retroperitoneal hematoma. May be constipation. Last bowel movement yesterday 19 AM. S/p tylenol helped little. Get stat KUB.  Narco once pain med.  Pepcid IV once. Avoiding dulcolax suppository while on heparin drip.  Discussed with bed side Ground team. Jonny Ruiz will see if not getting better with above plan of care Troponin ordered as a follow through  05:58 Troponin decreasing, labs are good.  Called back RN, he is still in pain. CxR ok. KUB still pending. To be safe I have asked John, [place] ground team to see him at bed side for any signs of RPH.

## 2023-01-31 ENCOUNTER — Inpatient Hospital Stay (HOSPITAL_COMMUNITY): Payer: 59

## 2023-01-31 DIAGNOSIS — I2602 Saddle embolus of pulmonary artery with acute cor pulmonale: Secondary | ICD-10-CM

## 2023-01-31 DIAGNOSIS — Z86711 Personal history of pulmonary embolism: Secondary | ICD-10-CM

## 2023-01-31 LAB — CBC
HCT: 40.8 % (ref 39.0–52.0)
Hemoglobin: 13.6 g/dL (ref 13.0–17.0)
MCH: 30.8 pg (ref 26.0–34.0)
MCHC: 33.3 g/dL (ref 30.0–36.0)
MCV: 92.3 fL (ref 80.0–100.0)
Platelets: 148 10*3/uL — ABNORMAL LOW (ref 150–400)
RBC: 4.42 MIL/uL (ref 4.22–5.81)
RDW: 13.7 % (ref 11.5–15.5)
WBC: 11.7 10*3/uL — ABNORMAL HIGH (ref 4.0–10.5)
nRBC: 0 % (ref 0.0–0.2)

## 2023-01-31 LAB — GLUCOSE, CAPILLARY
Glucose-Capillary: 126 mg/dL — ABNORMAL HIGH (ref 70–99)
Glucose-Capillary: 127 mg/dL — ABNORMAL HIGH (ref 70–99)
Glucose-Capillary: 103 mg/dL — ABNORMAL HIGH (ref 70–99)
Glucose-Capillary: 106 mg/dL — ABNORMAL HIGH (ref 70–99)
Glucose-Capillary: 217 mg/dL — ABNORMAL HIGH (ref 70–99)

## 2023-01-31 LAB — HEPARIN LEVEL (UNFRACTIONATED): Heparin Unfractionated: 0.57 [IU]/mL (ref 0.30–0.70)

## 2023-01-31 NOTE — Consult Note (Signed)
Urology Consult Note   Requesting Attending Physician:  Oretha Milch, MD Service Providing Consult: Urology  Consulting Attending: Dr. Marlou Porch   Reason for Consult:  ureteral stone  HPI: Nicholas [name] is seen in consultation for reasons noted above at the request of Oretha Milch, MD. patient presented to [facility] emergency department on 01/29/2023 after experiencing syncopal episode, shortness of breath, pressure, and tachypnea.  CTA reviewed submassive saddle pulmonary embolism with lactic acidosis, sepsis, and elevated troponins.  Incidental finding of multiple renal stones and left-sided ureteral stone measuring 8 mm.  Patient carries a history of nephrolithiasis and has been followed by Dr. Sheppard Penton up until his retirement.  Other PMH significant for previous PE, basal cell carcinoma, T2DM, and morbid obesity.  Patient was alert, oriented, no distress.  He he reports that he has not had any flank pain since Saturday.  We reviewed CT scan showing his renal stones and concerning ureteral stone.  Renal function is preserved, urinalysis shows no infection, and only mild leukocytosis.  He reports that he has passed a number of large stones without intervention and spent approximately $6000 on lithotripsy over the last couple of years, to his frustration.   ------------------  Assessment:  53 y.o. male with hx of nephrolithiasis   Recommendations: #left ureteral stone 8 mm proximal left ureteral stone.  Numerous renal stones bilaterally. Considering his unremarkable labs and lack of symptoms, it is reasonable to defer treatment for the time being.  Patient will establish in clinic where we will collect KUB to assess for progression of stone passage. Discussed anticoagulation with critical care team: Converting from heparin gtt to Xarelto tomorrow.  Will need 2-3 days for washout prior to intervention, please wait 4 weeks if elective, 2 weeks minimum.  Urology will not need to follow.   Please call with questions or acute urologic changes.  [facility] [address] second floor [place], [place] [address] [phone]   Case and plan discussed with Dr. Marlou Porch  Past Medical History: Past Medical History:  Diagnosis Date   Cancer [facility])    basal cell skin cancer   Cellulitis of right leg 09/17/2013   COVID-19    Diabetes mellitus without complication (HCC)    DVT (deep venous thrombosis) (HCC)    Dyspnea    due to PE   History of kidney stones    Morbid obesity (HCC)    Pulmonary embolism (HCC) 04/2020    Past Surgical History:  Past Surgical History:  Procedure Laterality Date   CLAVICLE HARDWARE REMOVAL Left 2007   CYSTOSCOPY/URETEROSCOPY/HOLMIUM LASER/STENT PLACEMENT Right 12/31/2020   Procedure: CYSTOSCOPY/URETEROSCOPY/HOLMIUM LASER/STENT PLACEMENT;  Surgeon: Orson Ape, MD;  Location: [facility] ORS;  Service: Urology;  Laterality: Right;   EXTRACORPOREAL SHOCK WAVE LITHOTRIPSY Right 05/16/2019   Procedure: EXTRACORPOREAL SHOCK WAVE LITHOTRIPSY (ESWL);  Surgeon: Orson Ape, MD;  Location: [facility] ORS;  Service: Urology;  Laterality: Right;   EXTRACORPOREAL SHOCK WAVE LITHOTRIPSY Right 07/18/2019   Procedure: EXTRACORPOREAL SHOCK WAVE LITHOTRIPSY (ESWL);  Surgeon: Orson Ape, MD;  Location: [facility] ORS;  Service: Urology;  Laterality: Right;   EXTRACORPOREAL SHOCK WAVE LITHOTRIPSY Left 10/24/2019   Procedure: EXTRACORPOREAL SHOCK WAVE LITHOTRIPSY (ESWL);  Surgeon: Orson Ape, MD;  Location: [facility] ORS;  Service: Urology;  Laterality: Left;   FRACTURE SURGERY Left 2007   "shattered clavicle; broke it in 9 places"   IR ANGIOGRAM PULMONARY BILATERAL SELECTIVE  01/29/2023   IR ANGIOGRAM SELECTIVE EACH ADDITIONAL  VESSEL  01/29/2023   IR ANGIOGRAM SELECTIVE EACH ADDITIONAL VESSEL  01/29/2023   IR INFUSION THROMBOL ARTERIAL INITIAL (MS)  01/29/2023   IR INFUSION THROMBOL ARTERIAL INITIAL (MS)  01/29/2023   IR THROMB F/U EVAL  ART/VEN FINAL DAY (MS)  01/30/2023   IR US GUIDE VASC ACCESS RIGHT  01/29/2023    Medication: Current Facility-Administered Medications  Medication Dose Route Frequency Provider Last Rate Last Admin   acetaminophen (TYLENOL) tablet 650 mg  650 mg Oral Q6H PRN Oretha Milch, MD   650 mg at 01/30/23 0419   Chlorhexidine Gluconate Cloth 2 % PADS 6 each  6 each Topical Daily Lynnell Catalan, MD   6 each at 01/31/23 1054   docusate sodium (COLACE) capsule 100 mg  100 mg Oral BID PRN Janeann Forehand D, NP       heparin ADULT infusion 100 units/mL (25000 units/250mL)  1,900 Units/hr Intravenous Continuous Juliette Mangle, RPH 19 mL/hr at 01/31/23 1100 1,900 Units/hr at 01/31/23 1100   HYDROmorphone (DILAUDID) injection 0.5 mg  0.5 mg Intravenous Q4H PRN Pia Mau D, PA-C       insulin aspart (novoLOG) injection 0-15 Units  0-15 Units Subcutaneous Q4H Janeann Forehand D, NP       Oral care mouth rinse  15 mL Mouth Rinse PRN Agarwala, Daleen Bo, MD       polyethylene glycol (MIRALAX / GLYCOLAX) packet 17 g  17 g Oral Daily PRN Janeann Forehand D, NP       sodium chloride flush (NS) 0.9 % injection 3 mL  3 mL Intravenous Q12H Mugweru, Jon, MD   3 mL at 01/29/23 2101   sodium chloride flush (NS) 0.9 % injection 3 mL  3 mL Intravenous PRN Roanna Banning, MD        Allergies: Allergies  Allergen Reactions   Lisinopril     unknown   Amoxicillin Rash   Penicillins Other (See Comments)    Childhood reaction    Social History: Social History   Tobacco Use   Smoking status: Never   Smokeless tobacco: Former    Types: Snuff  Vaping Use   Vaping status: Never Used  Substance Use Topics   Alcohol use: Not Currently   Drug use: No    Family History Family History  Problem Relation Age of Onset   Leukemia Mother    Kidney disease Father    Heart attack Maternal Grandfather    Heart attack Paternal Grandfather    Stroke Other    Heart disease Other    Cancer Other     Review of Systems   Genitourinary:  Negative for dysuria, flank pain, frequency, hematuria and urgency.     Objective   Vital signs in last 24 hours: BP 121/88   Pulse 81   Temp 98.1 F (36.7 C) (Oral)   Resp 20   Ht 6\' 1"  (1.854 m)   Wt (!) 149.5 kg   SpO2 96%   BMI 43.48 kg/m   Physical Exam General: A&O, resting, appropriate HEENT: [place]/AT Pulmonary: Normal work of breathing Cardiovascular: no cyanosis Abdomen: Soft, NTTP, nondistended Neuro: Appropriate, no focal neurological deficits  Most Recent Labs: Lab Results  Component Value Date   WBC 11.7 (H) 01/31/2023   HGB 13.6 01/31/2023   HCT 40.8 01/31/2023   PLT 148 (L) 01/31/2023    Lab Results  Component Value Date   NA 140 01/30/2023   K 3.9 01/30/2023   CL 110 01/30/2023   CO2  19 (L) 01/30/2023   BUN 11 01/30/2023   CREATININE 0.98 01/30/2023   CALCIUM 8.6 (L) 01/30/2023   MG 2.0 01/30/2023   PHOS 2.8 01/30/2023    Lab Results  Component Value Date   INR 1.3 (H) 01/29/2023   APTT 108 (H) 01/29/2023     Urine Culture: @[id number] (laburin,org,[id number])@   IMAGING: IR Angiogram Selective Each Additional Vessel Result Date: 01/31/2023 INDICATION: Syncope with submassive PE. RIGHT heart strain RV LV ratio 2.4. POSITIVE troponins. EXAM: Procedures: 1. PULMONARY ARTERIOGRAPHY 2. PLACEMENT OF BILATERAL PULMONARY ARTERIAL LYTIC INFUSION CATHETERS COMPARISON:  Chest XR and CTA PE, 01/29/2023. MEDICATIONS: None ANESTHESIA/SEDATION: Moderate (conscious) sedation was employed during this procedure. A total of Versed 2 mg and Fentanyl 100 mcg was administered intravenously. Moderate Sedation Time: 55 minutes. [name] level of consciousness and vital signs were monitored continuously by radiology nursing throughout the procedure under my direct supervision. CONTRAST:  30mL OMNIPAQUE IOHEXOL 300 MG/ML SOLN, 40mL OMNIPAQUE IOHEXOL 300 MG/ML SOLN FLUOROSCOPY TIME:  Fluoroscopic dose; 423 mGy COMPLICATIONS: None immediate.  TECHNIQUE: Informed written consent was obtained from the patient and/or patient's representative after a discussion of the risks, benefits and alternatives to treatment. Questions regarding the procedure were encouraged and answered. A timeout was performed prior to the initiation of the procedure. Ultrasound scanning was performed of the RIGHT groin and demonstrated patency of the common femoral vein. As such, the RIGHT common femoral vein was selected venous access. The RIGHT groin was prepped and draped in the usual sterile fashion, and a sterile drape was applied covering the operative field. Maximum barrier sterile technique with sterile gowns and gloves were used for the procedure. A timeout was performed prior to the initiation of the procedure. Local anesthesia was provided with 1% lidocaine. Under direct ultrasound guidance, the RIGHT common femoral vein was accessed with a micro puncture kit ultimately allowing placement of a 6 Fr 10 cm vascular sheath. Slightly cranial to this initial access, the RIGHT common femoral was again accessed with an additional micropuncture kit ultimately allowing placement of an additional 6 Fr 10 cm vascular sheath. Ultrasound images were saved for procedural documentation purposes. With the use of a Bentson wire, an angled pulmonary catheter was advanced into the main pulmonary artery and a limited central pulmonary arteriogram was performed. Pressure measurements were then obtained from the main pulmonary artery. A 5 Fr angled pigtail catheter was advanced into the distal branch of the LEFT lower lobe pulmonary artery. Limited contrast injection confirmed appropriate positioning. Over an exchange length Pollyann Kennedy, the catheter was exchanged for a 90/10 cm multi side-hole infusion catheter. The procedure was then repeated on the patient's RIGHT pulmonary artery, and with the use of a stiff Rosen wire, access was obtained into a distal branch of the RIGHT lower lobe pulmonary  artery allowing placement of a 90/10 cm multi side-hole infusion catheter. A postprocedural fluoroscopic image was obtained to document final catheter positioning. The external catheter tubing was secured at the right thigh and the lytic therapy was initiated. The patient tolerated the procedure well without immediate postprocedural complication. FINDINGS: Central pulmonary arteriogram demonstrates central pulmonary emboli at the BILATERAL pulmonary arterial bifurcations. Acquired pressure measurements: Main  pulmonary artery; 58/19, mean 35 (normal: < 25/10) Following the procedure, both infusion catheter tips terminate within the distal aspects of the bilateral lower lobe sub segmental pulmonary arteries. IMPRESSION: Successful initiation of BILATERAL catheter-directed pulmonary arterial lysis for submassive pulmonary embolism and RIGHT heart strain. PLAN: Lytic catheter infusion rates as described  per order set. Vascular Interventional Radiology will follow the patient with anticipated catheter removal during rounds tomorrow. Roanna Banning, MD [facility] Radiology Electronically Signed   By: Roanna Banning M.D.   On: 01/31/2023 08:47   IR INFUSION THROMBOL ARTERIAL INITIAL (MS) Result Date: 01/31/2023 INDICATION: Syncope with submassive PE. RIGHT heart strain RV LV ratio 2.4. POSITIVE troponins. EXAM: Procedures: 1. PULMONARY ARTERIOGRAPHY 2. PLACEMENT OF BILATERAL PULMONARY ARTERIAL LYTIC INFUSION CATHETERS COMPARISON:  Chest XR and CTA PE, 01/29/2023. MEDICATIONS: None ANESTHESIA/SEDATION: Moderate (conscious) sedation was employed during this procedure. A total of Versed 2 mg and Fentanyl 100 mcg was administered intravenously. Moderate Sedation Time: 55 minutes. [name] level of consciousness and vital signs were monitored continuously by radiology nursing throughout the procedure under my direct supervision. CONTRAST:  30mL OMNIPAQUE IOHEXOL 300 MG/ML  SOLN, 40mL OMNIPAQUE IOHEXOL 300 MG/ML SOLN FLUOROSCOPY TIME:  Fluoroscopic dose; 423 mGy COMPLICATIONS: None immediate. TECHNIQUE: Informed written consent was obtained from the patient and/or patient's representative after a discussion of the risks, benefits and alternatives to treatment. Questions regarding the procedure were encouraged and answered. A timeout was performed prior to the initiation of the procedure. Ultrasound scanning was performed of the RIGHT groin and demonstrated patency of the common femoral vein. As such, the RIGHT common femoral vein was selected venous access. The RIGHT groin was prepped and draped in the usual sterile fashion, and a sterile drape was applied covering the operative field. Maximum barrier sterile technique with sterile gowns and gloves were used for the procedure. A timeout was performed prior to the initiation of the procedure. Local anesthesia was provided with 1% lidocaine. Under direct ultrasound guidance, the RIGHT common femoral vein was accessed with a micro puncture kit ultimately allowing placement of a 6 Fr 10 cm vascular sheath. Slightly cranial to this initial access, the RIGHT common femoral was again accessed with an additional micropuncture kit ultimately allowing placement of an additional 6 Fr 10 cm vascular sheath. Ultrasound images were saved for procedural documentation purposes. With the use of a Bentson wire, an angled pulmonary catheter was advanced into the main pulmonary artery and a limited central pulmonary arteriogram was performed. Pressure measurements were then obtained from the main pulmonary artery. A 5 Fr angled pigtail catheter was advanced into the distal branch of the LEFT lower lobe pulmonary artery. Limited contrast injection confirmed appropriate positioning. Over an exchange length Pollyann Kennedy, the catheter was exchanged for a 90/10 cm multi side-hole infusion catheter. The procedure was then repeated on the patient's RIGHT pulmonary  artery, and with the use of a stiff Rosen wire, access was obtained into a distal branch of the RIGHT lower lobe pulmonary artery allowing placement of a 90/10 cm multi side-hole infusion catheter. A postprocedural fluoroscopic image was obtained to document final catheter positioning. The external catheter tubing was secured at the right thigh and the lytic therapy was initiated. The patient tolerated the procedure well without immediate postprocedural complication. FINDINGS: Central pulmonary arteriogram demonstrates central pulmonary emboli at the BILATERAL pulmonary arterial bifurcations. Acquired pressure measurements: Main  pulmonary artery; 58/19, mean 35 (normal: < 25/10) Following the procedure, both infusion catheter tips terminate within the distal aspects of the bilateral lower lobe sub segmental pulmonary arteries. IMPRESSION: Successful initiation of BILATERAL catheter-directed pulmonary arterial lysis for submassive pulmonary embolism and RIGHT heart strain. PLAN: Lytic catheter infusion rates as described per order set. Vascular Interventional Radiology will follow the patient with anticipated catheter removal during rounds tomorrow.  Roanna Banning, MD [name] Radiology Electronically Signed   By: Roanna Banning M.D.   On: 01/31/2023 08:47   IR Angiogram Selective Each Additional Vessel Result Date: 01/31/2023 INDICATION: Syncope with submassive PE. RIGHT heart strain RV LV ratio 2.4. POSITIVE troponins. EXAM: Procedures: 1. PULMONARY ARTERIOGRAPHY 2. PLACEMENT OF BILATERAL PULMONARY ARTERIAL LYTIC INFUSION CATHETERS COMPARISON:  Chest XR and CTA PE, 01/29/2023. MEDICATIONS: None ANESTHESIA/SEDATION: Moderate (conscious) sedation was employed during this procedure. A total of Versed 2 mg and Fentanyl 100 mcg was administered intravenously. Moderate Sedation Time: 55 minutes. [name] level of consciousness and vital signs were monitored  continuously by radiology nursing throughout the procedure under my direct supervision. CONTRAST:  30mL OMNIPAQUE IOHEXOL 300 MG/ML SOLN, 40mL OMNIPAQUE IOHEXOL 300 MG/ML SOLN FLUOROSCOPY TIME:  Fluoroscopic dose; 423 mGy COMPLICATIONS: None immediate. TECHNIQUE: Informed written consent was obtained from the patient and/or patient's representative after a discussion of the risks, benefits and alternatives to treatment. Questions regarding the procedure were encouraged and answered. A timeout was performed prior to the initiation of the procedure. Ultrasound scanning was performed of the RIGHT groin and demonstrated patency of the common femoral vein. As such, the RIGHT common femoral vein was selected venous access. The RIGHT groin was prepped and draped in the usual sterile fashion, and a sterile drape was applied covering the operative field. Maximum barrier sterile technique with sterile gowns and gloves were used for the procedure. A timeout was performed prior to the initiation of the procedure. Local anesthesia was provided with 1% lidocaine. Under direct ultrasound guidance, the RIGHT common femoral vein was accessed with a micro puncture kit ultimately allowing placement of a 6 Fr 10 cm vascular sheath. Slightly cranial to this initial access, the RIGHT common femoral was again accessed with an additional micropuncture kit ultimately allowing placement of an additional 6 Fr 10 cm vascular sheath. Ultrasound images were saved for procedural documentation purposes. With the use of a Bentson wire, an angled pulmonary catheter was advanced into the main pulmonary artery and a limited central pulmonary arteriogram was performed. Pressure measurements were then obtained from the main pulmonary artery. A 5 Fr angled pigtail catheter was advanced into the distal branch of the LEFT lower lobe pulmonary artery. Limited contrast injection confirmed appropriate positioning. Over an exchange length Pollyann Kennedy, the catheter  was exchanged for a 90/10 cm multi side-hole infusion catheter. The procedure was then repeated on the patient's RIGHT pulmonary artery, and with the use of a stiff Rosen wire, access was obtained into a distal branch of the RIGHT lower lobe pulmonary artery allowing placement of a 90/10 cm multi side-hole infusion catheter. A postprocedural fluoroscopic image was obtained to document final catheter positioning. The external catheter tubing was secured at the right thigh and the lytic therapy was initiated. The patient tolerated the procedure well without immediate postprocedural complication. FINDINGS: Central pulmonary arteriogram demonstrates central pulmonary emboli at the BILATERAL pulmonary arterial bifurcations. Acquired pressure measurements: Main  pulmonary artery; 58/19, mean 35 (normal: < 25/10) Following the procedure, both infusion catheter tips terminate within the distal aspects of the bilateral lower lobe sub segmental pulmonary arteries. IMPRESSION: Successful initiation of BILATERAL catheter-directed pulmonary arterial lysis for submassive pulmonary embolism and RIGHT heart strain. PLAN: Lytic catheter infusion rates as described per order set. Vascular Interventional Radiology will follow the patient with anticipated catheter removal during rounds tomorrow. Roanna Banning, MD [name] Radiology Electronically Signed   By: Cletis Athens  Mugweru M.D.   On: 01/31/2023 08:46   ECHOCARDIOGRAM COMPLETE Result Date: 01/30/2023    ECHOCARDIOGRAM REPORT   Patient Name:   Nicholas [name] Date of Exam: 01/30/2023 Medical Rec #:  [id number]        Height:       73.0 in Accession #:    [id number]       Weight:       329.6 lb Date of Birth:  26-Aug-1970        BSA:          2.661 m Patient Age:    52 years         BP:           100/69 mmHg Patient Gender: M                HR:           82 bpm. Exam Location:  Inpatient Procedure: 2D Echo, Color Doppler, Cardiac Doppler  and Intracardiac            Opacification Agent Indications:    Pulmonary Thromboembolism  History:        Patient has prior history of Echocardiogram examinations, most                 recent 06/21/2021. Pulmonary Thromboembolism,                 Signs/Symptoms:Dyspnea; Risk Factors:Diabetes.  Sonographer:    Raeford Razor RDCS Referring Phys: [phone] WHITNEY D HARRIS IMPRESSIONS  1. Left ventricular ejection fraction, by estimation, is 65 to 70%. The left ventricle has normal function. The left ventricle has no regional wall motion abnormalities. Left ventricular diastolic parameters are indeterminate.  2. Right ventricular systolic function is mildly reduced. The right ventricular size is mildly enlarged. Tricuspid regurgitation signal is inadequate for assessing PA pressure.  3. The mitral valve is grossly normal. No evidence of mitral valve regurgitation. No evidence of mitral stenosis.  4. The aortic valve is grossly normal. Aortic valve regurgitation is not visualized. No aortic stenosis is present.  5. The inferior vena cava is dilated in size with >50% respiratory variability, suggesting right atrial pressure of 8 mmHg. FINDINGS  Left Ventricle: Left ventricular ejection fraction, by estimation, is 65 to 70%. The left ventricle has normal function. The left ventricle has no regional wall motion abnormalities. The left ventricular internal cavity size was normal in size. There is  no left ventricular hypertrophy. Left ventricular diastolic parameters are indeterminate. Right Ventricle: The right ventricular size is mildly enlarged. No increase in right ventricular wall thickness. Right ventricular systolic function is mildly reduced. Tricuspid regurgitation signal is inadequate for assessing PA pressure. Left Atrium: Left atrial size was normal in size. Right Atrium: Right atrial size was normal in size. Pericardium: There is no evidence of pericardial effusion. Presence of epicardial fat layer. Mitral Valve: The  mitral valve is grossly normal. No evidence of mitral valve regurgitation. No evidence of mitral valve stenosis. Tricuspid Valve: The tricuspid valve is normal in structure. Tricuspid valve regurgitation is trivial. No evidence of tricuspid stenosis. Aortic Valve: The aortic valve is grossly normal. Aortic valve regurgitation is not visualized. No aortic stenosis is present. Aortic valve mean gradient measures 2.0 mmHg. Aortic valve peak gradient measures 3.8 mmHg. Aortic valve area, by VTI measures 4.16 cm. Pulmonic Valve: The pulmonic valve was normal in structure. Pulmonic valve regurgitation is trivial. No evidence of pulmonic stenosis. Aorta: The aortic root is normal in  size and structure. Venous: The inferior vena cava is dilated in size with greater than 50% respiratory variability, suggesting right atrial pressure of 8 mmHg. IAS/Shunts: No atrial level shunt detected by color flow Doppler.  LEFT VENTRICLE PLAX 2D LVIDd:         4.50 cm      Diastology LVIDs:         3.50 cm      LV e' medial:    5.00 cm/s LV PW:         1.30 cm      LV E/e' medial:  12.9 LV IVS:        0.90 cm      LV e' lateral:   6.96 cm/s LVOT diam:     2.50 cm      LV E/e' lateral: 9.3 LV SV:         69 LV SV Index:   26 LVOT Area:     4.91 cm  LV Volumes (MOD) LV vol d, MOD A2C: 88.4 ml LV vol d, MOD A4C: 129.0 ml LV vol s, MOD A2C: 39.6 ml LV vol s, MOD A4C: 53.1 ml LV SV MOD A2C:     48.8 ml LV SV MOD A4C:     129.0 ml LV SV MOD BP:      61.9 ml RIGHT VENTRICLE             IVC RV Basal diam:  3.30 cm     IVC diam: 2.00 cm RV Mid diam:    2.70 cm RV S prime:     13.20 cm/s TAPSE (M-mode): 1.6 cm LEFT ATRIUM           Index        RIGHT ATRIUM           Index LA Vol (A2C): 49.8 ml 18.72 ml/m  RA Area:     10.50 cm                                    RA Volume:   17.90 ml  6.73 ml/m  AORTIC VALVE AV Area (Vmax):    4.17 cm AV Area (Vmean):   4.11 cm AV Area (VTI):     4.16 cm AV Vmax:           98.00 cm/s AV Vmean:           65.350 cm/s AV VTI:            0.166 m AV Peak Grad:      3.8 mmHg AV Mean Grad:      2.0 mmHg LVOT Vmax:         83.30 cm/s LVOT Vmean:        54.700 cm/s LVOT VTI:          0.141 m LVOT/AV VTI ratio: 0.85  AORTA Ao Root diam: 3.50 cm Ao Asc diam:  2.60 cm MITRAL VALVE MV Area (PHT): 5.31 cm    SHUNTS MV Decel Time: 143 msec    Systemic VTI:  0.14 m MV E velocity: 64.50 cm/s  Systemic Diam: 2.50 cm MV A velocity: 50.80 cm/s MV E/A ratio:  1.27 Weston Brass MD Electronically signed by Weston Brass MD Signature Date/Time: 01/30/2023/9:38:25 PM    Final    CT RENAL STONE STUDY Result Date: 01/30/2023 CLINICAL DATA:  Abdominal and flank pain.  Nephrolithiasis. EXAM: CT ABDOMEN  AND PELVIS WITHOUT CONTRAST TECHNIQUE: Multidetector CT imaging of the abdomen and pelvis was performed following the standard protocol without IV contrast. RADIATION DOSE REDUCTION: This exam was performed according to the departmental dose-optimization program which includes automated exposure control, adjustment of the mA and/or kV according to patient size and/or use of iterative reconstruction technique. COMPARISON:  Chest CTA on 01/29/2023, and AP CT on 12/21/2020 FINDINGS: Lower chest: New sub-solid opacity in the posterior right lower lobe which measures 4.9 x 3.0 cm, consistent with pulmonary infarct given pulmonary emboli shown on chest CT performed yesterday. Hepatobiliary: No mass visualized on this unenhanced exam. Gallbladder contains contrast material attributed to IV contrast administered yesterday, but is otherwise unremarkable. Pancreas: No mass or inflammatory process visualized on this unenhanced exam. Spleen:  Within normal limits in size. Adrenals/Urinary tract: Bilateral renal calculi are again seen, largest measuring 14 mm in lower pole of left kidney. Mild-to-moderate left hydronephrosis is seen due to a 8 mm calculus at the left UPJ. Normal appearance of bladder. Stomach/Bowel: No evidence of obstruction,  inflammatory process, or abnormal fluid collections. Normal appendix visualized. Diverticulosis is seen mainly involving the sigmoid colon, however there is no evidence of diverticulitis. Vascular/Lymphatic: No pathologically enlarged lymph nodes identified. No evidence of abdominal aortic aneurysm. Reproductive:  No mass or other significant abnormality. Other:  Mild hemorrhage seen in right groin soft tissues. Musculoskeletal:  No suspicious bone lesions identified. IMPRESSION: Mild-to-moderate left hydronephrosis due to 8 mm calculus at the left UPJ. Bilateral nephrolithiasis. Colonic diverticulosis, without radiographic evidence of diverticulitis. Mild hemorrhage in right groin soft tissues. New 4.9 cm sub-solid opacity in posterior right lower lobe, consistent with pulmonary infarct given acute pulmonary embolism shown on chest CTA performed yesterday. Electronically Signed   By: Danae Orleans M.D.   On: 01/30/2023 12:14   IR THROMB F/U EVAL ART/VEN FINAL DAY (MS) Result Date: 01/30/2023 INDICATION: History of sub massive pulmonary embolism, post initiation of bilateral pulmonary arterial thrombolysis on 01/29/2023. Patient has completed prescribed course of bilateral pulmonary arterial lytic infusion and presents today for repeat pulmonary arterial pressure measurements prior to infusion catheter removal. EXAM: PULMONARY ARTERIAL PRESSURE MEASUREMENT AND INFUSION CATHETER(S) REMOVAL COMPARISON:  IR fluoroscopy, 01/29/2023. Chest XR earlier same day. CTA PE, 01/29/2023 MEDICATIONS: None CONTRAST:  None FLUOROSCOPY TIME:  None COMPLICATIONS: None immediate. TECHNIQUE: The patient was positioned supine on there hospital bed. Review of this morning's chest radiograph demonstrates unchanged positioning of bilateral pulmonary arterial infusion catheters with tip of the right-sided pulmonary arterial infusion catheter approximately 5-10 cm peripheral to the expected outflow of the main pulmonary artery. As such, the  right pulmonary arterial catheter's infusion wire was removed and the multi side-hole infusion catheter was retracted to the estimated location of the main pulmonary artery. Pressure measurements were acquired from this location. At this point, the procedure was terminated. All wires, catheters and sheaths were removed from the patient. Hemostasis was achieved at the right groin access site with manual compression. A dressing was placed. The patient tolerated the procedure well without immediate postprocedural complication. FINDINGS: Acquired pressure measurements as follows: Preprocedural main pulmonary artery-58/19; mean-35 (normal: < 25/10) Postprocedural main pulmonary artery-31/14; mean-20 IMPRESSION: Successful BILATERAL catheter directed pulmonary arterial thrombolysis with reduction (15 mmHg) in pressure within the main pulmonary artery. Roanna Banning, MD [facility] Radiology Electronically Signed   By: Roanna Banning M.D.   On: 01/30/2023 09:29   IR INFUSION THROMBOL ARTERIAL INITIAL (MS) Result Date: 01/30/2023 INDICATION: Syncope with  submassive PE. RIGHT heart strain RV LV ratio 2.4. POSITIVE troponins. EXAM: Procedures: 1. PULMONARY ARTERIOGRAPHY 2. PLACEMENT OF BILATERAL PULMONARY ARTERIAL LYTIC INFUSION CATHETERS COMPARISON:  Chest XR and CTA PE, 01/29/2023. MEDICATIONS: None ANESTHESIA/SEDATION: Moderate (conscious) sedation was employed during this procedure. A total of Versed 2 mg and Fentanyl 100 mcg was administered intravenously. Moderate Sedation Time: 55 minutes. [name] level of consciousness and vital signs were monitored continuously by radiology nursing throughout the procedure under my direct supervision. CONTRAST:  30mL OMNIPAQUE IOHEXOL 300 MG/ML SOLN, 40mL OMNIPAQUE IOHEXOL 300 MG/ML SOLN FLUOROSCOPY TIME:  Fluoroscopic dose; 423 mGy COMPLICATIONS: None immediate. TECHNIQUE: Informed written consent was obtained from the patient  and/or patient's representative after a discussion of the risks, benefits and alternatives to treatment. Questions regarding the procedure were encouraged and answered. A timeout was performed prior to the initiation of the procedure. Ultrasound scanning was performed of the RIGHT groin and demonstrated patency of the common femoral vein. As such, the RIGHT common femoral vein was selected venous access. The RIGHT groin was prepped and draped in the usual sterile fashion, and a sterile drape was applied covering the operative field. Maximum barrier sterile technique with sterile gowns and gloves were used for the procedure. A timeout was performed prior to the initiation of the procedure. Local anesthesia was provided with 1% lidocaine. Under direct ultrasound guidance, the RIGHT common femoral vein was accessed with a micro puncture kit ultimately allowing placement of a 6 Fr 10 cm vascular sheath. Slightly cranial to this initial access, the RIGHT common femoral was again accessed with an additional micropuncture kit ultimately allowing placement of an additional 6 Fr 10 cm vascular sheath. Ultrasound images were saved for procedural documentation purposes. With the use of a Bentson wire, an angled pulmonary catheter was advanced into the main pulmonary artery and a limited central pulmonary arteriogram was performed. Pressure measurements were then obtained from the main pulmonary artery. A 5 Fr angled pigtail catheter was advanced into the distal branch of the LEFT lower lobe pulmonary artery. Limited contrast injection confirmed appropriate positioning. Over an exchange length Pollyann Kennedy, the catheter was exchanged for a 90/10 cm multi side-hole infusion catheter. The procedure was then repeated on the patient's RIGHT pulmonary artery, and with the use of a stiff Rosen wire, access was obtained into a distal branch of the RIGHT lower lobe pulmonary artery allowing placement of a 90/10 cm multi side-hole infusion  catheter. A postprocedural fluoroscopic image was obtained to document final catheter positioning. The external catheter tubing was secured at the right thigh and the lytic therapy was initiated. The patient tolerated the procedure well without immediate postprocedural complication. FINDINGS: Central pulmonary arteriogram demonstrates central pulmonary emboli at the BILATERAL pulmonary arterial bifurcations. Acquired pressure measurements: Main  pulmonary artery; 58/19, mean 35 (normal: < 25/10) Following the procedure, both infusion catheter tips terminate within the distal aspects of the bilateral lower lobe sub segmental pulmonary arteries. IMPRESSION: Successful initiation of BILATERAL catheter-directed pulmonary arterial lysis for submassive pulmonary embolism and RIGHT heart strain. PLAN: Lytic catheter infusion rates as described per order set. Vascular Interventional Radiology will follow the patient with anticipated catheter removal during rounds tomorrow. Roanna Banning, MD [facility]iology Special[facility] Radiology Electronically Signed   By: Roanna Banning M.D.   On: 01/30/2023 09:23   IR US Guide Vasc Access Right Result Date: 01/30/2023 INDICATION: Syncope with submassive PE. RIGHT heart strain RV LV ratio 2.4. POSITIVE troponins. EXAM: Procedures: 1. PULMONARY ARTERIOGRAPHY 2.  PLACEMENT OF BILATERAL PULMONARY ARTERIAL LYTIC INFUSION CATHETERS COMPARISON:  Chest XR and CTA PE, 01/29/2023. MEDICATIONS: None ANESTHESIA/SEDATION: Moderate (conscious) sedation was employed during this procedure. A total of Versed 2 mg and Fentanyl 100 mcg was administered intravenously. Moderate Sedation Time: 55 minutes. [name] level of consciousness and vital signs were monitored continuously by radiology nursing throughout the procedure under my direct supervision. CONTRAST:  30mL OMNIPAQUE IOHEXOL 300 MG/ML SOLN, 40mL OMNIPAQUE IOHEXOL 300 MG/ML SOLN FLUOROSCOPY TIME:  Fluoroscopic  dose; 423 mGy COMPLICATIONS: None immediate. TECHNIQUE: Informed written consent was obtained from the patient and/or patient's representative after a discussion of the risks, benefits and alternatives to treatment. Questions regarding the procedure were encouraged and answered. A timeout was performed prior to the initiation of the procedure. Ultrasound scanning was performed of the RIGHT groin and demonstrated patency of the common femoral vein. As such, the RIGHT common femoral vein was selected venous access. The RIGHT groin was prepped and draped in the usual sterile fashion, and a sterile drape was applied covering the operative field. Maximum barrier sterile technique with sterile gowns and gloves were used for the procedure. A timeout was performed prior to the initiation of the procedure. Local anesthesia was provided with 1% lidocaine. Under direct ultrasound guidance, the RIGHT common femoral vein was accessed with a micro puncture kit ultimately allowing placement of a 6 Fr 10 cm vascular sheath. Slightly cranial to this initial access, the RIGHT common femoral was again accessed with an additional micropuncture kit ultimately allowing placement of an additional 6 Fr 10 cm vascular sheath. Ultrasound images were saved for procedural documentation purposes. With the use of a Bentson wire, an angled pulmonary catheter was advanced into the main pulmonary artery and a limited central pulmonary arteriogram was performed. Pressure measurements were then obtained from the main pulmonary artery. A 5 Fr angled pigtail catheter was advanced into the distal branch of the LEFT lower lobe pulmonary artery. Limited contrast injection confirmed appropriate positioning. Over an exchange length Pollyann Kennedy, the catheter was exchanged for a 90/10 cm multi side-hole infusion catheter. The procedure was then repeated on the patient's RIGHT pulmonary artery, and with the use of a stiff Rosen wire, access was obtained into a  distal branch of the RIGHT lower lobe pulmonary artery allowing placement of a 90/10 cm multi side-hole infusion catheter. A postprocedural fluoroscopic image was obtained to document final catheter positioning. The external catheter tubing was secured at the right thigh and the lytic therapy was initiated. The patient tolerated the procedure well without immediate postprocedural complication. FINDINGS: Central pulmonary arteriogram demonstrates central pulmonary emboli at the BILATERAL pulmonary arterial bifurcations. Acquired pressure measurements: Main  pulmonary artery; 58/19, mean 35 (normal: < 25/10) Following the procedure, both infusion catheter tips terminate within the distal aspects of the bilateral lower lobe sub segmental pulmonary arteries. IMPRESSION: Successful initiation of BILATERAL catheter-directed pulmonary arterial lysis for submassive pulmonary embolism and RIGHT heart strain. PLAN: Lytic catheter infusion rates as described per order set. Vascular Interventional Radiology will follow the patient with anticipated catheter removal during rounds tomorrow. Roanna Banning, MD [facility] Inter[facility] Radiology Specialists [facility] Radiology Electronically Signed   By: Roanna Banning M.D.   On: 01/30/2023 09:23   IR Angiogram Pulmonary Bilateral Selective Result Date: 01/30/2023 INDICATION: Syncope with submassive PE. RIGHT heart strain RV LV ratio 2.4. POSITIVE troponins. EXAM: Procedures: 1. PULMONARY ARTERIOGRAPHY 2. PLACEMENT OF BILATERAL PULMONARY ARTERIAL LYTIC INFUSION CATHETERS COMPARISON:  Chest XR and CTA PE, 01/29/2023. MEDICATIONS: None  ANESTHESIA/SEDATION: Moderate (conscious) sedation was employed during this procedure. A total of Versed 2 mg and Fentanyl 100 mcg was administered intravenously. Moderate Sedation Time: 55 minutes. [name] level of consciousness and vital signs were monitored continuously by radiology nursing throughout the procedure under my direct  supervision. CONTRAST:  30mL OMNIPAQUE IOHEXOL 300 MG/ML SOLN, 40mL OMNIPAQUE IOHEXOL 300 MG/ML SOLN FLUOROSCOPY TIME:  Fluoroscopic dose; 423 mGy COMPLICATIONS: None immediate. TECHNIQUE: Informed written consent was obtained from the patient and/or patient's representative after a discussion of the risks, benefits and alternatives to treatment. Questions regarding the procedure were encouraged and answered. A timeout was performed prior to the initiation of the procedure. Ultrasound scanning was performed of the RIGHT groin and demonstrated patency of the common femoral vein. As such, the RIGHT common femoral vein was selected venous access. The RIGHT groin was prepped and draped in the usual sterile fashion, and a sterile drape was applied covering the operative field. Maximum barrier sterile technique with sterile gowns and gloves were used for the procedure. A timeout was performed prior to the initiation of the procedure. Local anesthesia was provided with 1% lidocaine. Under direct ultrasound guidance, the RIGHT common femoral vein was accessed with a micro puncture kit ultimately allowing placement of a 6 Fr 10 cm vascular sheath. Slightly cranial to this initial access, the RIGHT common femoral was again accessed with an additional micropuncture kit ultimately allowing placement of an additional 6 Fr 10 cm vascular sheath. Ultrasound images were saved for procedural documentation purposes. With the use of a Bentson wire, an angled pulmonary catheter was advanced into the main pulmonary artery and a limited central pulmonary arteriogram was performed. Pressure measurements were then obtained from the main pulmonary artery. A 5 Fr angled pigtail catheter was advanced into the distal branch of the LEFT lower lobe pulmonary artery. Limited contrast injection confirmed appropriate positioning. Over an exchange length Pollyann Kennedy, the catheter was exchanged for a 90/10 cm multi side-hole infusion catheter. The  procedure was then repeated on the patient's RIGHT pulmonary artery, and with the use of a stiff Rosen wire, access was obtained into a distal branch of the RIGHT lower lobe pulmonary artery allowing placement of a 90/10 cm multi side-hole infusion catheter. A postprocedural fluoroscopic image was obtained to document final catheter positioning. The external catheter tubing was secured at the right thigh and the lytic therapy was initiated. The patient tolerated the procedure well without immediate postprocedural complication. FINDINGS: Central pulmonary arteriogram demonstrates central pulmonary emboli at the BILATERAL pulmonary arterial bifurcations. Acquired pressure measurements: Main  pulmonary artery; 58/19, mean 35 (normal: < 25/10) Following the procedure, both infusion catheter tips terminate within the distal aspects of the bilateral lower lobe sub segmental pulmonary arteries. IMPRESSION: Successful initiation of BILATERAL catheter-directed pulmonary arterial lysis for submassive pulmonary embolism and RIGHT heart strain. PLAN: Lytic catheter infusion rates as described per order set. Vascular Interventional Radiology will follow the patient with anticipated catheter removal during rounds tomorrow. Roanna Banning, MD [facility] and Interventional Radiology Specialists [name] Electronically Signed   By: Roanna Banning M.D.   On: 01/30/2023 09:23   DG Chest Port 1 View Result Date: 01/30/2023 CLINICAL DATA:  Submassive PE. Thrombolysis. 132440 Post-operative state [id number] EXAM: PORTABLE CHEST 1 VIEW COMPARISON:  Chest XR and CTA chest, 01/29/2023. IR fluoroscopy, 01/29/2023. FINDINGS: Support lines; stable positioning of BILATERAL pulmonary arterial infusion catheters. Cardiomediastinal silhouette is within normal limits. Lungs are well inflated. No focal consolidation or mass. No pleural effusion or  pneumothorax. No acute displaced fracture. IMPRESSION: 1. Stable positioning of BILATERAL pulmonary  arterial infusion catheters. 2. No acute superimposed cardiopulmonary process. Electronically Signed   By: Roanna Banning M.D.   On: 01/30/2023 08:22   DG Abd 1 View Result Date: 01/30/2023 CLINICAL DATA:  Acute abdominal and back pain EXAM: ABDOMEN - 1 VIEW COMPARISON:  Abdominal CT 12/21/2020 FINDINGS: Pulmonary artery catheters over the abdomen are unremarkable. Renal calculi, numerous on the right. Bowel gas pattern is normal. IMPRESSION: Normal bowel gas pattern. Unremarkable intra-abdominal position of pulmonary artery catheters. Electronically Signed   By: Tiburcio Pea M.D.   On: 01/30/2023 06:49   CT Angio Chest PE W and/or Wo Contrast Result Date: 01/29/2023 CLINICAL DATA:  Short of breath.  Elevated D-dimer. EXAM: CT ANGIOGRAPHY CHEST WITH CONTRAST TECHNIQUE: Multidetector CT imaging of the chest was performed using the standard protocol during bolus administration of intravenous contrast. Multiplanar CT image reconstructions and MIPs were obtained to evaluate the vascular anatomy. RADIATION DOSE REDUCTION: This exam was performed according to the departmental dose-optimization program which includes automated exposure control, adjustment of the mA and/or kV according to patient size and/or use of iterative reconstruction technique. CONTRAST:  75mL OMNIPAQUE IOHEXOL 350 MG/ML SOLN COMPARISON:  01/02/2022. FINDINGS: Cardiovascular: There are multiple bilateral pulmonary emboli. Emboli noted in the main pulmonary arteries extending into all segmental branches, slightly higher burden on the right than the left. A thin saddle embolus crosses from the left to right pulmonary arteries. RV LV ratio is 2.4, significantly elevated consistent with right heart strain. Heart is normal in overall size. No pericardial effusion. Aorta is normal in caliber. No dissection or atherosclerosis. Mediastinum/Nodes: No neck base, mediastinal or hilar masses. No enlarged lymph nodes. Trachea esophagus are unremarkable.  Lungs/Pleura: Mild linear opacities, mostly in the lower lungs, consistent with atelectasis. No lung consolidation to suggest infarction. No pulmonary edema. No pleural effusion or pneumothorax. Upper Abdomen: No acute abnormality. Musculoskeletal: No acute fracture. No bone lesion. No chest wall mass. Review of the MIP images confirms the above findings. IMPRESSION: 1. Positive for acute bilateral pulmonary emboli. Multiple emboli in the main pulmonary arteries extending into all segmental branches. Thin saddle embolus. 2. Positive for right heart strain (RV/LV Ratio = 2.4 ) consistent with at least submassive (intermediate risk) PE. The presence of right heart strain has been associated with an increased risk of morbidity and mortality. Please refer to the "Code PE Focused" order set in EPIC. Critical Value/emergent results were called by telephone at the time of interpretation on 01/29/2023 at 5:09 pm to provider [name] , who verbally acknowledged these results. Electronically Signed   By: Amie Portland M.D.   On: 01/29/2023 17:09   DG Chest 2 View Result Date: 01/29/2023 CLINICAL DATA:  Chest pain. EXAM: CHEST - 2 VIEW COMPARISON:  04/07/2022 FINDINGS: Lungs are hypoinflated without focal airspace consolidation or effusion. Cardiomediastinal silhouette and remainder the exam is unchanged. IMPRESSION: Hypoinflation without acute cardiopulmonary disease. Electronically Signed   By: Elberta Fortis M.D.   On: 01/29/2023 15:18    ------  Elmon Kirschner, NP Pager: [phone]   Please contact the urology consult pager with any further questions/concerns.

## 2023-01-31 NOTE — Progress Notes (Signed)
PHARMACY - ANTICOAGULATION CONSULT NOTE  Pharmacy Consult for heparin  Indication: pulmonary embolus  Allergies  Allergen Reactions   Lisinopril     unknown   Amoxicillin Rash   Penicillins Other (See Comments)    Childhood reaction    Patient Measurements: Height: 6\' 1"  (185.4 cm) Weight: (!) 149.5 kg (329 lb 9.4 oz) IBW/kg (Calculated) : 79.9 Heparin Dosing Weight: 114.8kg   Vital Signs: Temp: 98.3 F (36.8 C) (01/21 0800) Temp Source: Oral (01/21 0800) BP: 117/86 (01/21 0600) Pulse Rate: 70 (01/21 0600)  Labs: Recent Labs    01/29/23 1510 01/29/23 1521 01/29/23 1526 01/29/23 1649 01/29/23 1807 01/29/23 2041 01/30/23 0019 01/30/23 0428 01/30/23 1233 01/30/23 1748 01/31/23 0455  HGB 16.9   < > 17.3*  --   --  15.6   < > 15.2 14.3  --  13.6  HCT 50.2   < > 51.0  --   --  45.7   < > 45.4 42.7  --  40.8  PLT 238  --   --   --   --  208   < > 170 156  --  148*  APTT  --   --   --   --   --  108*  --   --   --   --   --   LABPROT  --   --   --   --   --  16.0*  --   --   --   --   --   INR  --   --   --   --   --  1.3*  --   --   --   --   --   HEPARINUNFRC  --   --   --   --   --  0.71*   < > 0.74* 0.60 0.51 0.57  CREATININE 1.19  --  1.10  --   --   --   --  0.98  --   --   --   TROPONINIHS 326*  --   --    < > 691* 963*  --  537*  --   --   --    < > = values in this interval not displayed.    Estimated Creatinine Clearance: 137.4 mL/min (by C-G formula based on SCr of 0.98 mg/dL).   Medical History: Past Medical History:  Diagnosis Date   Cancer (HCC)    basal cell skin cancer   Cellulitis of right leg 09/17/2013   COVID-19    Diabetes mellitus without complication (HCC)    DVT (deep venous thrombosis) (HCC)    Dyspnea    due to PE   History of kidney stones    Morbid obesity (HCC)    Pulmonary embolism (HCC) 04/2020    Assessment: Patient admitted with PE s/p catheter directed thrombolysis which ended this morning 08:00 on 1/20. HgB 14.3 and  PLTs 156. Heparin level therapeutic at 0.60 while on heparin 1900u/hr.   Heparin level therapeutic at 0.57, no issues with bleeding noted. Plan to continue heparin for 72 hours from start then transition to Xarelto per CCM. HgB 13.6 and PLTs 148.   Goal of Therapy:  Heparin level 0.3-0.7 units/ml Monitor platelets by anticoagulation protocol: Yes   Plan:  Continue heparin infusion at 1900 units/hr Check anti-Xa level daily while on heparin Continue to monitor H&H and platelets F/u transition to Xarelto, co-pay $30.00.   Estill Batten, PharmD,  BCCCP  Clinical Pharmacist 01/31/2023  8:54 AM

## 2023-01-31 NOTE — Progress Notes (Signed)
Venous duplex lower ext  has been completed. Refer to [facility] under chart review to view preliminary results.   01/31/2023  2:41 PM Jamarquis Crull, Gerarda Gunther

## 2023-01-31 NOTE — Progress Notes (Signed)
NAME:  Nicholas [name], MRN:  [id number], DOB:  31-Mar-1970, LOS: 2 ADMISSION DATE:  01/29/2023, CONSULTATION DATE:  01/29/2023 REFERRING MD:  Dr. Laveda Norman - EDP, CHIEF COMPLAINT:  Submassive PE   History of Present Illness:  Nicholas [name] is a 53 y.o. with a past medical history of PE and DVT (suspected to be secondary to COVID-19), prior basal cell carcinoma, type 2 diabetes, and morbid obesity who presented to the ED 1/19 after experiencing syncopal episode.  Further history reveals patient was also short of breath with chest pressure.  On arrival to ED, patient was tachypneic, tachycardic, and mildly hypertensive.  Given clinical concern for PE, patient was taken for CTA chest which revealed acute submassive PE with RV LV ratio 2.4.  Lab work at that time significant for: troponin 326 > 467, lactic 2.9 > 1.9, WBC 22.2.  PCCM consulted for further management and admission given need for mechanical thrombectomy for submassive PE.   Pertinent  Medical History  Prior PE and DVT felt secondary to COVID-19 anticoagulated with Xarelto x 1 year then stopped, prior basal cell carcinoma, type 2 diabetes, and morbid obesity  Significant Hospital Events: Including procedures, antibiotic start and stop dates in addition to other pertinent events   1/19 presented after syncopal episode, found to have submassive saddle PE.  Underwent catheter-directed thrombolysis (6 PM).  Admitted to 4[facility] ICU. 1/20 thrombolytic infusion ongoing.  On heparin gtt.  Complains of back pain and pain in testes.  CT renal stone study shows L UPJ stone with associated hydronephrosis. 1/21 Right femoral sheath removed by IR, breathing improved   Interim History / Subjective:  Respiratory status improved, patient states "breathing better"   Objective   Blood pressure 117/86, pulse 70, temperature 98 F (36.7 C), temperature source Axillary, resp. rate 15, height 6\' 1"  (1.854 m), weight (!) 149.5 kg, SpO2 93%.        Intake/Output  Summary (Last 24 hours) at 01/31/2023 0704 Last data filed at 01/31/2023 0600 Gross per 24 hour  Intake 1681.99 ml  Output 300 ml  Net 1381.99 ml   Filed Weights   01/29/23 1430 01/29/23 2020 01/30/23 0504  Weight: (!) 152.4 kg (!) 149.5 kg (!) 149.5 kg    Examination: General: pleasant obese adult male sitting up on ICU bed HEENT: Normocephalic, Poor dentition, Pink MM, Freer 2L, PERRLA intact  Pulm: Cl BL breath sounds, diminished lower bases, no distress on 2L [place]  Cards: S1, s2, RRR, no MRG, no JVD Abs: obese, soft, active Extremities: Moves all extremities on command, non pitting edema Neuro: AO x4, follows commands GU: intact, patient having no pain or issues  Resolved Hospital Problem list     Assessment & Plan:  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  Syncope prior to admission w/lab work indicating RV stress including high-sensitivity troponin 326 > 467 and positive lactic acid (2.9) Previous history of PE: shortly after suffering COVID-19, anticoagulated with Xarelto x 1 year. Echo LVEF 65-70%, no regional wall abnormalities, RV mildly reduced, also mildly enlarged  P: IR following, appreciate assistance and recs Continue heparin gtt Patient would like to take xarelto if insurance approves once able to transition off heparin gtt>> will need life long anticoag  L UPJ stone with associated hydronephrosis Back pain and testicular pain Patient not complaining of any more pain, with or without urination. Patient making urine, clear yellow per nursing staff.  CT renal study mild to moderate left hydronephrosis on 1/20  P: Recommend following up outpatient  If pain returns, or decrease in urine output will need to repage Urology consulted  on 1/20   Essential hypertension Hyperlipidemia P: Continue home meds Cozaar and Crestor   Type 2 diabetes P: Continue to hold metformin  Continue SSI,  CBG ACHS  Card modified diet    Best Practice (right click and "Reselect all SmartList Selections" daily)   Diet/type: carb modified diet, heart healthy  DVT prophylaxis heparin gtt  Pressure ulcer(s): N/A GI prophylaxis: PPI Lines: N/A Foley:  N/A Code Status:  full code Last date of multidisciplinary goals of care discussion:  Update patient at beside on 1/21   Hazel Sams AGACNP-BC   [facility] 01/31/2023, 11:16 AM  Please see [email or web address] for pager details.  From 7A-7P if no response, please call [phone]. After hours, please call ELink [phone].

## 2023-01-31 NOTE — Progress Notes (Signed)
Transfer NOTE  Arrival Method: w/c, ambulated to bed Mental Orientation: alert and oriented x 4 Telemetry: verified c second and on continuous pulse ox Assessment: Completed Skin: intact IV: heparin at 19 ml/hour Pain: headache improving, minimal Tubes: none Safety Measures: Safety Fall Prevention Plan has been given, discussed and signed Admission: Completed [facility] Orientation: Patient has been orientated to the room, unit and staff.  Family: pt will notify family, friends at bedside,   Pt has cell phone and Consulting civil engineer.   Orders have been reviewed and implemented. Will continue to monitor the patient. Call light has been placed within reach and bed alarm has been activated.   Irwin Brakeman, RN

## 2023-01-31 NOTE — Plan of Care (Signed)
  Problem: Coping: Goal: Ability to adjust to condition or change in health will improve Outcome: Progressing   Problem: Education: Goal: Ability to describe self-care measures that may prevent or decrease complications (Diabetes Survival Skills Education) will improve Outcome: Progressing   Problem: Metabolic: Goal: Ability to maintain appropriate glucose levels will improve Outcome: Progressing   Problem: Nutritional: Goal: Maintenance of adequate nutrition will improve Outcome: Progressing   Problem: Skin Integrity: Goal: Risk for impaired skin integrity will decrease Outcome: Progressing   Problem: Tissue Perfusion: Goal: Adequacy of tissue perfusion will improve Outcome: Progressing   Problem: Education: Goal: Knowledge of General Education information will improve Description: Including pain rating scale, medication(s)/side effects and non-pharmacologic comfort measures Outcome: Progressing   Problem: Health Behavior/Discharge Planning: Goal: Ability to manage health-related needs will improve Outcome: Progressing   Problem: Clinical Measurements: Goal: Ability to maintain clinical measurements within normal limits will improve Outcome: Progressing

## 2023-02-01 ENCOUNTER — Other Ambulatory Visit (HOSPITAL_COMMUNITY): Payer: Self-pay

## 2023-02-01 DIAGNOSIS — I2699 Other pulmonary embolism without acute cor pulmonale: Secondary | ICD-10-CM | POA: Diagnosis not present

## 2023-02-01 LAB — BASIC METABOLIC PANEL
Anion gap: 8 (ref 5–15)
BUN: 11 mg/dL (ref 6–20)
CO2: 22 mmol/L (ref 22–32)
Calcium: 8.6 mg/dL — ABNORMAL LOW (ref 8.9–10.3)
Chloride: 106 mmol/L (ref 98–111)
Creatinine, Ser: 0.89 mg/dL (ref 0.61–1.24)
GFR, Estimated: >60 mL/min (ref >60)
Glucose, Bld: 111 mg/dL — ABNORMAL HIGH (ref 70–99)
Potassium: 3.7 mmol/L (ref 3.5–5.1)
Sodium: 136 mmol/L (ref 135–145)

## 2023-02-01 LAB — GLUCOSE, CAPILLARY
Glucose-Capillary: 102 mg/dL — ABNORMAL HIGH (ref 70–99)
Glucose-Capillary: 110 mg/dL — ABNORMAL HIGH (ref 70–99)
Glucose-Capillary: 116 mg/dL — ABNORMAL HIGH (ref 70–99)
Glucose-Capillary: 123 mg/dL — ABNORMAL HIGH (ref 70–99)
Glucose-Capillary: 136 mg/dL — ABNORMAL HIGH (ref 70–99)
Glucose-Capillary: 94 mg/dL (ref 70–99)
Glucose-Capillary: 95 mg/dL (ref 70–99)

## 2023-02-01 LAB — CBC
HCT: 39.3 % (ref 39.0–52.0)
Hemoglobin: 13.3 g/dL (ref 13.0–17.0)
MCH: 30.5 pg (ref 26.0–34.0)
MCHC: 33.8 g/dL (ref 30.0–36.0)
MCV: 90.1 fL (ref 80.0–100.0)
Platelets: 156 10*3/uL (ref 150–400)
RBC: 4.36 MIL/uL (ref 4.22–5.81)
RDW: 13.6 % (ref 11.5–15.5)
WBC: 10.5 10*3/uL (ref 4.0–10.5)
nRBC: 0 % (ref 0.0–0.2)

## 2023-02-01 LAB — HEPARIN LEVEL (UNFRACTIONATED): Heparin Unfractionated: 0.53 [IU]/mL (ref 0.30–0.70)

## 2023-02-01 MED ORDER — RIVAROXABAN 15 MG PO TABS
15.0000 mg | ORAL_TABLET | Freq: Two times a day (BID) | ORAL | Status: DC
  Administered 2023-02-01 – 2023-02-02 (×3): 15 mg via ORAL
  Filled 2023-02-01 (×3): qty 1

## 2023-02-01 MED ORDER — RIVAROXABAN 20 MG PO TABS: 20.0000 mg | ORAL_TABLET | Freq: Every day | ORAL | Status: DC

## 2023-02-01 MED ORDER — RIVAROXABAN (XARELTO) VTE STARTER PACK (15 & 20 MG)
ORAL_TABLET | ORAL | 0 refills | Status: DC
  Filled 2023-02-01: qty 51, 30d supply, fill #0

## 2023-02-01 NOTE — Progress Notes (Signed)
PHARMACY - ANTICOAGULATION CONSULT NOTE  Pharmacy Consult for heparin  Indication: pulmonary embolus  Allergies  Allergen Reactions   Lisinopril     unknown   Amoxicillin Rash   Penicillins Other (See Comments)    Childhood reaction    Patient Measurements: Height: 6\' 1"  (185.4 cm) Weight: (!) 150.4 kg (331 lb 9.2 oz) IBW/kg (Calculated) : 79.9 Heparin Dosing Weight: 114.8kg   Vital Signs: Temp: 98.6 F (37 C) (01/22 0410) BP: 120/78 (01/22 0410) Pulse Rate: 69 (01/22 0410)  Labs: Recent Labs    01/29/23 1526 01/29/23 1649 01/29/23 1807 01/29/23 2041 01/30/23 0019 01/30/23 0428 01/30/23 1233 01/30/23 1748 01/31/23 0455 02/01/23 0510  HGB 17.3*  --   --  15.6   < > 15.2 14.3  --  13.6 13.3  HCT 51.0  --   --  45.7   < > 45.4 42.7  --  40.8 39.3  PLT  --   --   --  208   < > 170 156  --  148* 156  APTT  --   --   --  108*  --   --   --   --   --   --   LABPROT  --   --   --  16.0*  --   --   --   --   --   --   INR  --   --   --  1.3*  --   --   --   --   --   --   HEPARINUNFRC  --   --   --  0.71*   < > 0.74* 0.60 0.51 0.57 0.53  CREATININE 1.10  --   --   --   --  0.98  --   --   --  0.89  TROPONINIHS  --    < > 691* 963*  --  537*  --   --   --   --    < > = values in this interval not displayed.    Estimated Creatinine Clearance: 151.8 mL/min (by C-G formula based on SCr of 0.89 mg/dL).   Medical History: Past Medical History:  Diagnosis Date   Cancer (HCC)    basal cell skin cancer   Cellulitis of right leg 09/17/2013   COVID-19    Diabetes mellitus without complication (HCC)    DVT (deep venous thrombosis) (HCC)    Dyspnea    due to PE   History of kidney stones    Morbid obesity (HCC)    Pulmonary embolism (HCC) 04/2020    Assessment: Patient admitted with PE s/p catheter directed thrombolysis   Heparin level remains therapeutic at 0.53, no issues with bleeding noted. Plan to continue heparin for 72 hours from start then transition to  Xarelto per CCM.  Goal of Therapy:  Heparin level 0.3-0.7 units/ml Monitor platelets by anticoagulation protocol: Yes   Plan:  Continue heparin infusion at 1900 units/hr Check anti-Xa level daily while on heparin Continue to monitor H&H and platelets F/u transition to Xarelto, co-pay $30.00.   Ruben Im, PharmD Clinical Pharmacist 02/01/2023 7:24 AM Please check AMION for all [facility] Pharmacy numbers

## 2023-02-01 NOTE — Plan of Care (Signed)
Starting Xarelto today.  Patient has not experienced any more discomfort from his ureteral stone.  He has established with the office in about 1 month, which should put him right in the timeframe his pulmonologist has recommended he stay on his anticoagulation before pausing for elective procedures.  No acute events overnight.  Will follow peripherally.

## 2023-02-01 NOTE — Progress Notes (Addendum)
PROGRESS NOTE    Nicholas [name] [facility]  VWU:[id number] DOB: Jun 08, 1970 DOA: 01/29/2023 PCP: Erskine Emery, NP   Brief Narrative:   53 y.o. with a past medical history of PE and DVT (suspected to be secondary to COVID-19), prior basal cell carcinoma, type 2 diabetes, and morbid obesity presented with syncope along with shortness of breath and chest pressure.  On presentation, he was tachypneic, tachycardic.  CTA chest revealed acute submassive PE along with elevated troponin and lactic acid level along with leukocytosis.  He was admitted to ICU under PCCM service and IR was consulted.  He underwent thrombolysis by IR and was started on heparin drip.  For worsening abdominal pain, CT renal study showed left UPJ stone with mild left hydronephrosis.  Urology recommended outpatient follow-up.  He was transferred to [facility] service from 02/01/2023 onwards.  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  He underwent thrombolysis by IR and was started on heparin drip.  Bilateral lower extremity duplex as above. -Pulmonary recommended heparin drip for 72 hours then switch to oral Xarelto.  Echo showed EF of 65 to 70% -He was transferred to [facility] service from 02/01/2023 onwards. -Currently on room air.  Will switch to oral Xarelto today.  If remains stable, he will be ready for discharge home tomorrow  Left UPJ stone with associated mild hydronephrosis Back pain and testicular pain -Pain is currently improving.  Urology recommended outpatient follow-up.  Essential hypertension Hyperlipidemia -Blood pressure currently stable.  Continue Cozaar and Crestor on discharge  Leukocytosis -Resolved  Thrombocytopenia -Resolved  Acute metabolic  acidosis -Resolved  Diabetes mellitus type 2 -Metformin on hold.  Carb modified diet.  Outpatient follow-up.  A1c 6.6  Lactic acidosis -Possibly from submassive PE.  Resolved.  Morbid obesity Outpatient follow-up   DVT prophylaxis: Heparin drip Code Status: Full Family Communication: None at bedside Disposition Plan: Status is: Inpatient Remains inpatient appropriate because: Of severity of illness   Consultants: PCCM/IR/urology  Procedures: As above  Antimicrobials: None   Subjective: Patient seen and examined at bedside.  Denies worsening shortness of breath, chest pain, fever or vomiting.  Objective: Vitals:   01/31/23 1600 01/31/23 1708 01/31/23 2018 02/01/23 0410  BP:  115/70 (!) 118/92 120/78  Pulse:  71 79 69  Resp:  12 18 18   Temp: (!) 97.5 F (36.4 C) 97.8 F (36.6 C) 98.7 F (37.1 C) 98.6 F (37 C)  TempSrc: Oral Oral    SpO2:  95%    Weight:  (!) 150.4 kg    Height:        Intake/Output Summary (Last 24 hours) at 02/01/2023 1044 Last data filed at 02/01/2023 0400 Gross per 24 hour  Intake 491.75 ml  Output --  Net 491.75 ml   Filed Weights   01/29/23 2020 01/30/23 0504 01/31/23 1708  Weight: (!) 149.5 kg (!) 149.5 kg (!) 150.4 kg    Examination:  General exam: Appears calm and comfortable.  On room air Respiratory system: Bilateral decreased breath sounds at bases Cardiovascular system: S1 & S2 heard, Rate controlled Gastrointestinal system: Abdomen is morbidly obese, nondistended, soft and nontender. Normal bowel sounds heard. Extremities: No cyanosis, clubbing; right lower extremity swelling present Central nervous system: Alert and oriented. No focal neurological  deficits. Moving extremities Skin: No rashes, lesions or ulcers Psychiatry: Judgement and insight appear normal. Mood & affect appropriate.     Data Reviewed: I have personally reviewed following labs and imaging studies  CBC: Recent Labs  Lab 01/29/23 1510  01/29/23 1521 01/30/23 0019 01/30/23 0428 01/30/23 1233 01/31/23 0455 02/01/23 0510  WBC 22.2*   < > 19.3* 18.4* 13.7* 11.7* 10.5  NEUTROABS 18.0*  --   --   --   --   --   --   HGB 16.9   < > 14.6 15.2 14.3 13.6 13.3  HCT 50.2   < > 43.2 45.4 42.7 40.8 39.3  MCV 92.1   < > 90.8 90.8 92.2 92.3 90.1  PLT 238   < > 175 170 156 148* 156   < > = values in this interval not displayed.   Basic Metabolic Panel: Recent Labs  Lab 01/29/23 1510 01/29/23 1521 01/29/23 1526 01/30/23 0428 02/01/23 0510  NA 138 142 141 140 136  K 4.1 4.1 4.2 3.9 3.7  CL 103  --  106 110 106  CO2 22  --   --  19* 22  GLUCOSE 173*  --  169* 144* 111*  BUN 11  --  15 11 11   CREATININE 1.19  --  1.10 0.98 0.89  CALCIUM 9.8  --   --  8.6* 8.6*  MG  --   --   --  2.0  --   PHOS  --   --   --  2.8  --    GFR: Estimated Creatinine Clearance: 151.8 mL/min (by C-G formula based on SCr of 0.89 mg/dL). Liver Function Tests: Recent Labs  Lab 01/29/23 1510  AST 53*  ALT 51*  ALKPHOS 75  BILITOT 1.0  PROT 7.7  ALBUMIN 3.7   No results for input(s): "LIPASE", "AMYLASE" in the last 168 hours. No results for input(s): "AMMONIA" in the last 168 hours. Coagulation Profile: Recent Labs  Lab 01/29/23 2041  INR 1.3*   Cardiac Enzymes: No results for input(s): "CKTOTAL", "CKMB", "CKMBINDEX", "TROPONINI" in the last 168 hours. BNP (last 3 results) No results for input(s): "PROBNP" in the last 8760 hours. HbA1C: Recent Labs    01/29/23 1807  HGBA1C 6.6*   CBG: Recent Labs  Lab 01/31/23 1604 01/31/23 2016 02/01/23 0103 02/01/23 0409 02/01/23 0952  GLUCAP 106* 217* 123* 116* 136*   Lipid Profile: No results for input(s): "CHOL", "HDL", "LDLCALC", "TRIG", "CHOLHDL", "LDLDIRECT" in the last 72 hours. Thyroid Function Tests: No results for input(s): "TSH", "T4TOTAL", "FREET4", "T3FREE", "THYROIDAB" in the last 72 hours. Anemia Panel: No results for input(s): "VITAMINB12", "FOLATE", "FERRITIN",  "TIBC", "IRON", "RETICCTPCT" in the last 72 hours. Sepsis Labs: Recent Labs  Lab 01/29/23 1522 01/29/23 1707  LATICACIDVEN 2.9* 1.9    Recent Results (from the past 240 hours)  Resp panel by RT-PCR (RSV, Flu A&B, Covid) Anterior Nasal Swab     Status: None   Collection Time: 01/29/23  3:25 PM   Specimen: Anterior Nasal Swab  Result Value Ref Range Status   SARS Coronavirus 2 by RT PCR NEGATIVE NEGATIVE Final   Influenza A by PCR NEGATIVE NEGATIVE Final   Influenza B by PCR NEGATIVE NEGATIVE Final    Comment: (NOTE) The Xpert Xpress SARS-CoV-2/FLU/RSV plus assay is intended as an aid in the diagnosis of influenza from Nasopharyngeal swab specimens and should not be used as a sole basis for treatment. Nasal washings and aspirates are unacceptable for  Xpert Xpress SARS-CoV-2/FLU/RSV testing.  Fact Sheet for Patients: BloggerCourse.com  Fact Sheet for Healthcare Providers: SeriousBroker.it  This test is not yet approved or cleared by the [place] FDA and has been authorized for detection and/or diagnosis of SARS-CoV-2 by FDA under an Emergency Use Authorization (EUA). This EUA will remain in effect (meaning this test can be used) for the duration of the COVID-19 declaration under Section 564(b)(1) of the Act, 21 U.S.C. section 360bbb-3(b)(1), unless the authorization is terminated or revoked.     Resp Syncytial Virus by PCR NEGATIVE NEGATIVE Final    Comment: (NOTE) Fact Sheet for Patients: BloggerCourse.com  Fact Sheet for Healthcare Providers: SeriousBroker.it  This test is not yet approved or cleared by the [place] FDA and has been authorized for detection and/or diagnosis of SARS-CoV-2 by FDA under an Emergency Use Authorization (EUA). This EUA will remain in effect (meaning this test can be used) for the duration of the COVID-19 declaration under Section  564(b)(1) of the Act, 21 U.S.C. section 360bbb-3(b)(1), unless the authorization is terminated or revoked.  Performed at [facility], [address] [place], [place] [address]   MRSA Next Gen by PCR, Nasal     Status: None   Collection Time: 01/29/23  8:48 PM   Specimen: Nasal Mucosa; Nasal Swab  Result Value Ref Range Status   MRSA by PCR Next Gen NOT DETECTED NOT DETECTED Final    Comment: (NOTE) The GeneXpert MRSA Assay (FDA approved for NASAL specimens only), is one component of a comprehensive MRSA colonization surveillance program. It is not intended to diagnose MRSA infection nor to guide or monitor treatment for MRSA infections. Test performance is not FDA approved in patients less than 8 years old. Performed at [facility], [address] [place], [place] [address]          Radiology Studies: VAS [place] LOWER EXTREMITY VENOUS (DVT) Result Date: 01/31/2023  Lower Venous DVT Study Patient Name:  VEDDER DICKERT  Date of Exam:   01/31/2023 Medical Rec #: [id number]         Accession #:    [id number] Date of Birth: March 13, 1970         Patient Gender: M Patient Age:   53 years Exam Location:  [facility] Procedure:      VAS [place] LOWER EXTREMITY VENOUS (DVT) Referring Phys: [facility] ALVA --------------------------------------------------------------------------------  Indications: Pulmonary embolism, and History of DVT in 2022.  Risk Factors: Confirmed PE. Comparison Study: 04/18/20 - right leg - acute DVT, left leg no evidence of DVT. Performing Technologist: Marilynne Halsted RDMS, RVT  Examination Guidelines: A complete evaluation includes B-mode imaging, spectral Doppler, color Doppler, and power Doppler as needed of all accessible portions of each vessel. Bilateral testing is considered an integral part of a complete examination. Limited examinations for reoccurring indications may be performed as noted. The reflux portion of the exam is performed with the patient in  reverse Trendelenburg.  +---------+---------------+---------+-----------+----------+-----------------+ RIGHT    CompressibilityPhasicitySpontaneityPropertiesThrombus Aging    +---------+---------------+---------+-----------+----------+-----------------+ CFV      Full           Yes      Yes                                    +---------+---------------+---------+-----------+----------+-----------------+ SFJ      Full                                                           +---------+---------------+---------+-----------+----------+-----------------+  FV Prox  Full                                                           +---------+---------------+---------+-----------+----------+-----------------+ FV Mid   Full                                                           +---------+---------------+---------+-----------+----------+-----------------+ FV DistalFull                                                           +---------+---------------+---------+-----------+----------+-----------------+ PFV      Full                                                           +---------+---------------+---------+-----------+----------+-----------------+ POP      None           No       No                   Acute             +---------+---------------+---------+-----------+----------+-----------------+ PTV      Partial                                      Age Indeterminate +---------+---------------+---------+-----------+----------+-----------------+ PERO     Partial                                      Age Indeterminate +---------+---------------+---------+-----------+----------+-----------------+   +---------+---------------+---------+-----------+----------+--------------+ LEFT     CompressibilityPhasicitySpontaneityPropertiesThrombus Aging +---------+---------------+---------+-----------+----------+--------------+ CFV      Full           Yes       Yes                                 +---------+---------------+---------+-----------+----------+--------------+ SFJ      Full                                                        +---------+---------------+---------+-----------+----------+--------------+ FV Prox  Full                                                        +---------+---------------+---------+-----------+----------+--------------+ FV Mid  Full                                                        +---------+---------------+---------+-----------+----------+--------------+ FV DistalFull                                                        +---------+---------------+---------+-----------+----------+--------------+ PFV      Full                                                        +---------+---------------+---------+-----------+----------+--------------+ POP      Full           Yes      Yes                                 +---------+---------------+---------+-----------+----------+--------------+ PTV      Full                                                        +---------+---------------+---------+-----------+----------+--------------+ PERO     Full                                                        +---------+---------------+---------+-----------+----------+--------------+    Summary: RIGHT: - Findings consistent with acute deep vein thrombosis involving the right popliteal vein.  - Findings consistent with age indeterminate deep vein thrombosis involving the right posterior tibial veins, and right peroneal veins.  - No cystic structure found in the popliteal fossa.  LEFT: - There is no evidence of deep vein thrombosis in the lower extremity.  - No cystic structure found in the popliteal fossa.  *See table(s) above for measurements and observations.    Preliminary    ECHOCARDIOGRAM COMPLETE Result Date: 01/30/2023    ECHOCARDIOGRAM REPORT   Patient Name:   CHRISEAN LANTEIGNE  Date of Exam: 01/30/2023 Medical Rec #:  [id number]        Height:       73.0 in Accession #:    [id number]       Weight:       329.6 lb Date of Birth:  1970-10-05        BSA:          2.661 m Patient Age:    52 years         BP:           100/69 mmHg Patient Gender: M                HR:           82 bpm. Exam Location:  Inpatient Procedure: 2D Echo, Color Doppler, Cardiac Doppler and Intracardiac            Opacification Agent Indications:    Pulmonary Thromboembolism  History:        Patient has prior history of Echocardiogram examinations, most                 recent 06/21/2021. Pulmonary Thromboembolism,                 Signs/Symptoms:Dyspnea; Risk Factors:Diabetes.  Sonographer:    Raeford Razor RDCS Referring Phys: [phone] WHITNEY D HARRIS IMPRESSIONS  1. Left ventricular ejection fraction, by estimation, is 65 to 70%. The left ventricle has normal function. The left ventricle has no regional wall motion abnormalities. Left ventricular diastolic parameters are indeterminate.  2. Right ventricular systolic function is mildly reduced. The right ventricular size is mildly enlarged. Tricuspid regurgitation signal is inadequate for assessing PA pressure.  3. The mitral valve is grossly normal. No evidence of mitral valve regurgitation. No evidence of mitral stenosis.  4. The aortic valve is grossly normal. Aortic valve regurgitation is not visualized. No aortic stenosis is present.  5. The inferior vena cava is dilated in size with >50% respiratory variability, suggesting right atrial pressure of 8 mmHg. FINDINGS  Left Ventricle: Left ventricular ejection fraction, by estimation, is 65 to 70%. The left ventricle has normal function. The left ventricle has no regional wall motion abnormalities. The left ventricular internal cavity size was normal in size. There is  no left ventricular hypertrophy. Left ventricular diastolic parameters are indeterminate. Right Ventricle: The right ventricular size is mildly enlarged. No  increase in right ventricular wall thickness. Right ventricular systolic function is mildly reduced. Tricuspid regurgitation signal is inadequate for assessing PA pressure. Left Atrium: Left atrial size was normal in size. Right Atrium: Right atrial size was normal in size. Pericardium: There is no evidence of pericardial effusion. Presence of epicardial fat layer. Mitral Valve: The mitral valve is grossly normal. No evidence of mitral valve regurgitation. No evidence of mitral valve stenosis. Tricuspid Valve: The tricuspid valve is normal in structure. Tricuspid valve regurgitation is trivial. No evidence of tricuspid stenosis. Aortic Valve: The aortic valve is grossly normal. Aortic valve regurgitation is not visualized. No aortic stenosis is present. Aortic valve mean gradient measures 2.0 mmHg. Aortic valve peak gradient measures 3.8 mmHg. Aortic valve area, by VTI measures 4.16 cm. Pulmonic Valve: The pulmonic valve was normal in structure. Pulmonic valve regurgitation is trivial. No evidence of pulmonic stenosis. Aorta: The aortic root is normal in size and structure. Venous: The inferior vena cava is dilated in size with greater than 50% respiratory variability, suggesting right atrial pressure of 8 mmHg. IAS/Shunts: No atrial level shunt detected by color flow Doppler.  LEFT VENTRICLE PLAX 2D LVIDd:         4.50 cm      Diastology LVIDs:         3.50 cm      LV e' medial:    5.00 cm/s LV PW:         1.30 cm      LV E/e' medial:  12.9 LV IVS:        0.90 cm      LV e' lateral:   6.96 cm/s LVOT diam:     2.50 cm      LV E/e' lateral: 9.3 LV SV:         69 LV SV Index:   26 LVOT  Area:     4.91 cm  LV Volumes (MOD) LV vol d, MOD A2C: 88.4 ml LV vol d, MOD A4C: 129.0 ml LV vol s, MOD A2C: 39.6 ml LV vol s, MOD A4C: 53.1 ml LV SV MOD A2C:     48.8 ml LV SV MOD A4C:     129.0 ml LV SV MOD BP:      61.9 ml RIGHT VENTRICLE             IVC RV Basal diam:  3.30 cm     IVC diam: 2.00 cm RV Mid diam:    2.70 cm RV S  prime:     13.20 cm/s TAPSE (M-mode): 1.6 cm LEFT ATRIUM           Index        RIGHT ATRIUM           Index LA Vol (A2C): 49.8 ml 18.72 ml/m  RA Area:     10.50 cm                                    RA Volume:   17.90 ml  6.73 ml/m  AORTIC VALVE AV Area (Vmax):    4.17 cm AV Area (Vmean):   4.11 cm AV Area (VTI):     4.16 cm AV Vmax:           98.00 cm/s AV Vmean:          65.350 cm/s AV VTI:            0.166 m AV Peak Grad:      3.8 mmHg AV Mean Grad:      2.0 mmHg LVOT Vmax:         83.30 cm/s LVOT Vmean:        54.700 cm/s LVOT VTI:          0.141 m LVOT/AV VTI ratio: 0.85  AORTA Ao Root diam: 3.50 cm Ao Asc diam:  2.60 cm MITRAL VALVE MV Area (PHT): 5.31 cm    SHUNTS MV Decel Time: 143 msec    Systemic VTI:  0.14 m MV E velocity: 64.50 cm/s  Systemic Diam: 2.50 cm MV A velocity: 50.80 cm/s MV E/A ratio:  1.27 Weston Brass MD Electronically signed by Weston Brass MD Signature Date/Time: 01/30/2023/9:38:25 PM    Final    CT RENAL STONE STUDY Result Date: 01/30/2023 CLINICAL DATA:  Abdominal and flank pain.  Nephrolithiasis. EXAM: CT ABDOMEN AND PELVIS WITHOUT CONTRAST TECHNIQUE: Multidetector CT imaging of the abdomen and pelvis was performed following the standard protocol without IV contrast. RADIATION DOSE REDUCTION: This exam was performed according to the departmental dose-optimization program which includes automated exposure control, adjustment of the mA and/or kV according to patient size and/or use of iterative reconstruction technique. COMPARISON:  Chest CTA on 01/29/2023, and AP CT on 12/21/2020 FINDINGS: Lower chest: New sub-solid opacity in the posterior right lower lobe which measures 4.9 x 3.0 cm, consistent with pulmonary infarct given pulmonary emboli shown on chest CT performed yesterday. Hepatobiliary: No mass visualized on this unenhanced exam. Gallbladder contains contrast material attributed to IV contrast administered yesterday, but is otherwise unremarkable. Pancreas: No  mass or inflammatory process visualized on this unenhanced exam. Spleen:  Within normal limits in size. Adrenals/Urinary tract: Bilateral renal calculi are again seen, largest measuring 14 mm in lower pole of left kidney. Mild-to-moderate left hydronephrosis is  seen due to a 8 mm calculus at the left UPJ. Normal appearance of bladder. Stomach/Bowel: No evidence of obstruction, inflammatory process, or abnormal fluid collections. Normal appendix visualized. Diverticulosis is seen mainly involving the sigmoid colon, however there is no evidence of diverticulitis. Vascular/Lymphatic: No pathologically enlarged lymph nodes identified. No evidence of abdominal aortic aneurysm. Reproductive:  No mass or other significant abnormality. Other:  Mild hemorrhage seen in right groin soft tissues. Musculoskeletal:  No suspicious bone lesions identified. IMPRESSION: Mild-to-moderate left hydronephrosis due to 8 mm calculus at the left UPJ. Bilateral nephrolithiasis. Colonic diverticulosis, without radiographic evidence of diverticulitis. Mild hemorrhage in right groin soft tissues. New 4.9 cm sub-solid opacity in posterior right lower lobe, consistent with pulmonary infarct given acute pulmonary embolism shown on chest CTA performed yesterday. Electronically Signed   By: Danae Orleans M.D.   On: 01/30/2023 12:14        Scheduled Meds:  Chlorhexidine Gluconate Cloth  6 each Topical Daily   insulin aspart  0-15 Units Subcutaneous Q4H   sodium chloride flush  3 mL Intravenous Q12H   Continuous Infusions:  heparin 1,900 Units/hr (01/31/23 1903)          Glade Lloyd, MD [facility] 02/01/2023, 10:44 AM

## 2023-02-02 ENCOUNTER — Other Ambulatory Visit (HOSPITAL_COMMUNITY): Payer: Self-pay

## 2023-02-02 DIAGNOSIS — I2699 Other pulmonary embolism without acute cor pulmonale: Secondary | ICD-10-CM | POA: Diagnosis not present

## 2023-02-02 LAB — CBC
HCT: 40.4 % (ref 39.0–52.0)
Hemoglobin: 13.6 g/dL (ref 13.0–17.0)
MCH: 30.3 pg (ref 26.0–34.0)
MCHC: 33.7 g/dL (ref 30.0–36.0)
MCV: 90 fL (ref 80.0–100.0)
Platelets: 161 10*3/uL (ref 150–400)
RBC: 4.49 MIL/uL (ref 4.22–5.81)
RDW: 13.5 % (ref 11.5–15.5)
WBC: 10.9 10*3/uL — ABNORMAL HIGH (ref 4.0–10.5)
nRBC: 0 % (ref 0.0–0.2)

## 2023-02-02 LAB — BASIC METABOLIC PANEL
Anion gap: 9 (ref 5–15)
BUN: 12 mg/dL (ref 6–20)
CO2: 24 mmol/L (ref 22–32)
Calcium: 8.7 mg/dL — ABNORMAL LOW (ref 8.9–10.3)
Chloride: 107 mmol/L (ref 98–111)
Creatinine, Ser: 0.96 mg/dL (ref 0.61–1.24)
GFR, Estimated: >60 mL/min (ref >60)
Glucose, Bld: 105 mg/dL — ABNORMAL HIGH (ref 70–99)
Potassium: 3.8 mmol/L (ref 3.5–5.1)
Sodium: 140 mmol/L (ref 135–145)

## 2023-02-02 LAB — GLUCOSE, CAPILLARY
Glucose-Capillary: 93 mg/dL (ref 70–99)
Glucose-Capillary: 214 mg/dL — ABNORMAL HIGH (ref 70–99)

## 2023-02-02 LAB — MAGNESIUM: Magnesium: 1.9 mg/dL (ref 1.7–2.4)

## 2023-02-02 NOTE — Plan of Care (Signed)
  Problem: Education: Goal: Ability to describe self-care measures that may prevent or decrease complications (Diabetes Survival Skills Education) will improve Outcome: Completed/Met Goal: Individualized Educational Video(s) Outcome: Not Applicable   

## 2023-02-02 NOTE — Progress Notes (Signed)
Nicholas [name] to be discharged Home per MD order. Discussed prescriptions and follow up appointments with the patient. Prescriptions given to patient; medication list explained in detail. Patient verbalized understanding.  Skin clean, dry and intact without evidence of skin break down, no evidence of skin tears noted. IV catheter discontinued intact. Site without signs and symptoms of complications. Dressing and pressure applied. Pt denies pain at the site currently. No complaints noted.  Patient free of lines, drains, and wounds.   An After Visit Summary (AVS) was printed and given to the patient. Patient escorted via wheelchair to discharge lounge.  Arvilla Meres, RN

## 2023-02-02 NOTE — TOC CM/SW Note (Signed)
   Send Xarelto prescription to [facility] pharmacy.   Transition of Care [facility]) - Inpatient Brief Assessment   Patient Details  Name: Nicholas [name] MRN: [id number] Date of Birth: 03/27/1970  Transition of Care [facility]) CM/SW Contact:    Harriet Masson, RN Phone Number: 02/02/2023, 8:44 AM   Clinical Narrative: Patient admitted for Acute DVT involving the right popliteal vein. Patient switched to Xarelto.  Xarelto will be $30 copay.    Transition of Care Asessment: Insurance and Status: Insurance coverage has been reviewed Patient has primary care physician: Yes Home environment has been reviewed: safe to discharge home Prior level of function:: independent Prior/Current Home Services: No current home services Social Drivers of Health Review: SDOH reviewed no interventions necessary Readmission risk has been reviewed: Yes Transition of care needs: no transition of care needs at this time

## 2023-02-02 NOTE — Discharge Instructions (Addendum)
Information on my medicine - XARELTO (rivaroxaban)  This medication education was reviewed with me or my healthcare representative as part of my discharge preparation.    WHY WAS XARELTO PRESCRIBED FOR YOU? Xarelto was prescribed to treat blood clots that may have been found in the veins of your legs (deep vein thrombosis) or in your lungs (pulmonary embolism) and to reduce the risk of them occurring again.  What do you need to know about Xarelto? The starting dose is one 15 mg tablet taken TWICE daily with food for the FIRST 21 DAYS then on (enter date)  02/22/23  the dose is changed to one 20 mg tablet taken ONCE A DAY with your evening meal.  DO NOT stop taking Xarelto without talking to the health care provider who prescribed the medication.  Refill your prescription for 20 mg tablets before you run out.  After discharge, you should have regular check-up appointments with your healthcare provider that is prescribing your Xarelto.  In the future your dose may need to be changed if your kidney function changes by a significant amount.  What do you do if you miss a dose? If you are taking Xarelto TWICE DAILY and you miss a dose, take it as soon as you remember. You may take two 15 mg tablets (total 30 mg) at the same time then resume your regularly scheduled 15 mg twice daily the next day.  If you are taking Xarelto ONCE DAILY and you miss a dose, take it as soon as you remember on the same day then continue your regularly scheduled once daily regimen the next day. Do not take two doses of Xarelto at the same time.   Important Safety Information Xarelto is a blood thinner medicine that can cause bleeding. You should call your healthcare provider right away if you experience any of the following: Bleeding from an injury or your nose that does not stop. Unusual colored urine (red or dark brown) or unusual colored stools (red or black). Unusual bruising for unknown reasons. A serious  fall or if you hit your head (even if there is no bleeding).  Some medicines may interact with Xarelto and might increase your risk of bleeding while on Xarelto. To help avoid this, consult your healthcare provider or pharmacist prior to using any new prescription or non-prescription medications, including herbals, vitamins, non-steroidal anti-inflammatory drugs (NSAIDs) and supplements.  This website has more information on Xarelto: [email or web address].br.

## 2023-02-02 NOTE — TOC CM/SW Note (Signed)
  Josepha Pigg, CPhT  Pharmacy Technician Pharmacy   Telephone Encounter Signed   Encounter Date: 01/30/2023  Related encounter: Telephone from 01/30/2023 in [facility] DEPARTMENT OF PHARMACY   Signed      Patient Advocate Encounter   Insurance verification completed.     The patient is insured through [facility]. Patient has [employer], may use a copay card, and/or apply for patient assistance if available.     Ran test claim for Eliquis 5 mg and the current 30 day co-pay is $30.00.   Ran test claim for Xarelto 20 mg and the current 30 day co-pay is $30.00.   This test claim was processed through [facility]- copay amounts may vary at other pharmacies due to pharmacy/plan contracts, or as the patient moves through the different stages of their insurance plan.         Roland Earl, CPHT Pharmacy Technician III Certified Patient Advocate [facility] Patient Advocate Team Direct Number: [phone]  Fax: [phone]

## 2023-02-02 NOTE — Discharge Summary (Signed)
Physician Discharge Summary  Nicholas [name] [facility] ZOX:[id number] DOB: Jan 15, 1970 DOA: 01/29/2023  PCP: Erskine Emery, NP  Admit date: 01/29/2023 Discharge date: 02/02/2023  Admitted From: Home Disposition: Home  Recommendations for Outpatient Follow-up:  Follow up with PCP in 1 week with repeat CBC/BMP Outpatient follow-up with urology Outpatient evaluation and follow-up by hematology Follow up in ED if symptoms worsen or new appear   Home Health: No Equipment/Devices: None  Discharge Condition: Stable CODE STATUS: Full Diet recommendation: Heart healthy/carb modified  Brief/Interim Summary: 53 y.o. with a past medical history of PE and DVT (suspected to be secondary to COVID-19), prior basal cell carcinoma, type 2 diabetes, and morbid obesity presented with syncope along with shortness of breath and chest pressure.  On presentation, he was tachypneic, tachycardic.  CTA chest revealed acute submassive PE along with elevated troponin and lactic acid level along with leukocytosis.  He was admitted to ICU under PCCM service and IR was consulted.  He underwent thrombolysis by IR and was started on heparin drip.  For worsening abdominal pain, CT renal study showed left UPJ stone with mild left hydronephrosis.  Urology recommended outpatient follow-up.  He was transferred to [facility] service from 02/01/2023 onwards.  He was subsequently switched to Eliquis and has remained stable.  He will be discharged home today on oral Eliquis.  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  He underwent thrombolysis by IR and was started on heparin drip.  Bilateral lower extremity duplex as above. -Pulmonary recommended heparin drip for 72 hours then switch to  oral Xarelto.  Echo showed EF of 65 to 70% -He was transferred to [facility] service from 02/01/2023 onwards. -Currently on room air.  Switched to his oral Xarelto from 02/01/2023 onwards.  He has remained hemodynamically stable and is currently on room air.  Feels okay to go home today.  Discharge patient home today on oral Eliquis with outpatient follow-up with PCP.  Outpatient evaluation and follow-up by hematology.   Left UPJ stone with associated mild hydronephrosis Back pain and testicular pain -Pain is currently improving.  Urology recommended outpatient follow-up.   Essential hypertension Hyperlipidemia -Blood pressure currently stable.  Continue Cozaar and Crestor on discharge   Leukocytosis -Resolved   Thrombocytopenia -Resolved   Acute metabolic acidosis -Resolved   Diabetes mellitus type 2 -Metformin will be resumed on discharge.  Carb modified diet.  Outpatient follow-up.  A1c 6.6   Lactic acidosis -Possibly from submassive PE.  Resolved.   Morbid obesity -Outpatient follow-up    Discharge Instructions   Allergies as of 02/02/2023       Reactions   Lisinopril    unknown   Amoxicillin Rash   Penicillins Other (See Comments)   Childhood reaction        Medication List     STOP taking these medications    rivaroxaban 20 MG Tabs tablet Commonly known as: XARELTO Replaced by: Xarelto Starter Pack       TAKE these medications    losartan 25 MG tablet Commonly known as: COZAAR Take 12.5 mg by mouth at bedtime.   metFORMIN 500 MG tablet Commonly known as: GLUCOPHAGE Take 1,000 mg by mouth 2 (two) times daily.   rosuvastatin 5 MG tablet Commonly known as: CRESTOR  Take 5 mg by mouth at bedtime.   Rybelsus 7 MG Tabs Generic drug: Semaglutide Take 7 mg by mouth every morning.   Xarelto Starter Pack Generic drug: Rivaroxaban Starter Pack (15 mg and 20 mg) Follow package directions: Take one 15mg  tablet by mouth twice a day. On day 22, switch to one  20mg  tablet once a day. Take with food. Replaces: rivaroxaban 20 MG Tabs tablet        Follow-up Information     Crist Fat, MD. Schedule an appointment as soon as possible for a visit in 3 week(s).   Specialty: Urology Contact information: [address] [place] [address] [phone]                Allergies  Allergen Reactions   Lisinopril     unknown   Amoxicillin Rash   Penicillins Other (See Comments)    Childhood reaction    Consultations: PCCM/urology/IR   Procedures/Studies: VAS [place] LOWER EXTREMITY VENOUS (DVT) Result Date: 02/01/2023  Lower Venous DVT Study Patient Name:  Nicholas [name]  Date of Exam:   01/31/2023 Medical Rec #: [id number]         Accession #:    [id number] Date of Birth: 1971-01-02         Patient Gender: M Patient Age:   53 years Exam Location:  [facility] Procedure:      VAS [place] LOWER EXTREMITY VENOUS (DVT) Referring Phys: Kathy Breach ALVA --------------------------------------------------------------------------------  Indications: Pulmonary embolism, and History of DVT in 2022.  Risk Factors: Confirmed PE. Comparison Study: 04/18/20 - right leg - acute DVT, left leg no evidence of DVT. Performing Technologist: Marilynne Halsted RDMS, RVT  Examination Guidelines: A complete evaluation includes B-mode imaging, spectral Doppler, color Doppler, and power Doppler as needed of all accessible portions of each vessel. Bilateral testing is considered an integral part of a complete examination. Limited examinations for reoccurring indications may be performed as noted. The reflux portion of the exam is performed with the patient in reverse Trendelenburg.  +---------+---------------+---------+-----------+----------+-----------------+ RIGHT    CompressibilityPhasicitySpontaneityPropertiesThrombus Aging    +---------+---------------+---------+-----------+----------+-----------------+ CFV      Full           Yes      Yes                                     +---------+---------------+---------+-----------+----------+-----------------+ SFJ      Full                                                           +---------+---------------+---------+-----------+----------+-----------------+ FV Prox  Full                                                           +---------+---------------+---------+-----------+----------+-----------------+ FV Mid   Full                                                           +---------+---------------+---------+-----------+----------+-----------------+  FV DistalFull                                                           +---------+---------------+---------+-----------+----------+-----------------+ PFV      Full                                                           +---------+---------------+---------+-----------+----------+-----------------+ POP      None           No       No                   Acute             +---------+---------------+---------+-----------+----------+-----------------+ PTV      Partial                                      Age Indeterminate +---------+---------------+---------+-----------+----------+-----------------+ PERO     Partial                                      Age Indeterminate +---------+---------------+---------+-----------+----------+-----------------+   +---------+---------------+---------+-----------+----------+--------------+ LEFT     CompressibilityPhasicitySpontaneityPropertiesThrombus Aging +---------+---------------+---------+-----------+----------+--------------+ CFV      Full           Yes      Yes                                 +---------+---------------+---------+-----------+----------+--------------+ SFJ      Full                                                        +---------+---------------+---------+-----------+----------+--------------+ FV Prox  Full                                                         +---------+---------------+---------+-----------+----------+--------------+ FV Mid   Full                                                        +---------+---------------+---------+-----------+----------+--------------+ FV DistalFull                                                        +---------+---------------+---------+-----------+----------+--------------+ PFV      Full                                                        +---------+---------------+---------+-----------+----------+--------------+  POP      Full           Yes      Yes                                 +---------+---------------+---------+-----------+----------+--------------+ PTV      Full                                                        +---------+---------------+---------+-----------+----------+--------------+ PERO     Full                                                        +---------+---------------+---------+-----------+----------+--------------+     Summary: RIGHT: - Findings consistent with acute deep vein thrombosis involving the right popliteal vein.  - Findings consistent with age indeterminate deep vein thrombosis involving the right posterior tibial veins, and right peroneal veins.  - No cystic structure found in the popliteal fossa.  LEFT: - There is no evidence of deep vein thrombosis in the lower extremity.  - No cystic structure found in the popliteal fossa.  *See table(s) above for measurements and observations. Electronically signed by Coral Else MD on 02/01/2023 at 6:57:25 PM.    Final    IR Angiogram Selective Each Additional Vessel Result Date: 01/31/2023 INDICATION: Syncope with submassive PE. RIGHT heart strain RV LV ratio 2.4. POSITIVE troponins. EXAM: Procedures: 1. PULMONARY ARTERIOGRAPHY 2. PLACEMENT OF BILATERAL PULMONARY ARTERIAL LYTIC INFUSION CATHETERS COMPARISON:  Chest XR and CTA PE, 01/29/2023. MEDICATIONS: None ANESTHESIA/SEDATION:  Moderate (conscious) sedation was employed during this procedure. A total of Versed 2 mg and Fentanyl 100 mcg was administered intravenously. Moderate Sedation Time: 55 minutes. [name] level of consciousness and vital signs were monitored continuously by radiology nursing throughout the procedure under my direct supervision. CONTRAST:  30mL OMNIPAQUE IOHEXOL 300 MG/ML SOLN, 40mL OMNIPAQUE IOHEXOL 300 MG/ML SOLN FLUOROSCOPY TIME:  Fluoroscopic dose; 423 mGy COMPLICATIONS: None immediate. TECHNIQUE: Informed written consent was obtained from the patient and/or patient's representative after a discussion of the risks, benefits and alternatives to treatment. Questions regarding the procedure were encouraged and answered. A timeout was performed prior to the initiation of the procedure. Ultrasound scanning was performed of the RIGHT groin and demonstrated patency of the common femoral vein. As such, the RIGHT common femoral vein was selected venous access. The RIGHT groin was prepped and draped in the usual sterile fashion, and a sterile drape was applied covering the operative field. Maximum barrier sterile technique with sterile gowns and gloves were used for the procedure. A timeout was performed prior to the initiation of the procedure. Local anesthesia was provided with 1% lidocaine. Under direct ultrasound guidance, the RIGHT common femoral vein was accessed with a micro puncture kit ultimately allowing placement of a 6 Fr 10 cm vascular sheath. Slightly cranial to this initial access, the RIGHT common femoral was again accessed with an additional micropuncture kit ultimately allowing placement of an additional 6 Fr 10 cm vascular sheath. Ultrasound images were saved for procedural documentation purposes. With the use of a Bentson wire, an angled pulmonary  catheter was advanced into the main pulmonary artery and a limited central pulmonary arteriogram was performed. Pressure measurements were then obtained  from the main pulmonary artery. A 5 Fr angled pigtail catheter was advanced into the distal branch of the LEFT lower lobe pulmonary artery. Limited contrast injection confirmed appropriate positioning. Over an exchange length Pollyann Kennedy, the catheter was exchanged for a 90/10 cm multi side-hole infusion catheter. The procedure was then repeated on the patient's RIGHT pulmonary artery, and with the use of a stiff Rosen wire, access was obtained into a distal branch of the RIGHT lower lobe pulmonary artery allowing placement of a 90/10 cm multi side-hole infusion catheter. A postprocedural fluoroscopic image was obtained to document final catheter positioning. The external catheter tubing was secured at the right thigh and the lytic therapy was initiated. The patient tolerated the procedure well without immediate postprocedural complication. FINDINGS: Central pulmonary arteriogram demonstrates central pulmonary emboli at the BILATERAL pulmonary arterial bifurcations. Acquired pressure measurements: Main  pulmonary artery; 58/19, mean 35 (normal: < 25/10) Following the procedure, both infusion catheter tips terminate within the distal aspects of the bilateral lower lobe sub segmental pulmonary arteries. IMPRESSION: Successful initiation of BILATERAL catheter-directed pulmonary arterial lysis for submassive pulmonary embolism and RIGHT heart strain. PLAN: Lytic catheter infusion rates as described per order set. Vascular Interventional Radiology will follow the patient with anticipated catheter removal during rounds tomorrow. Roanna Banning, MD [facility] Radiology Electronically Signed   By: Roanna Banning M.D.   On: 01/31/2023 08:47   IR INFUSION THROMBOL ARTERIAL INITIAL (MS) Result Date: 01/31/2023 INDICATION: Syncope with submassive PE. RIGHT heart strain RV LV ratio 2.4. POSITIVE troponins. EXAM: Procedures: 1. PULMONARY ARTERIOGRAPHY 2. PLACEMENT OF BILATERAL PULMONARY  ARTERIAL LYTIC INFUSION CATHETERS COMPARISON:  Chest XR and CTA PE, 01/29/2023. MEDICATIONS: None ANESTHESIA/SEDATION: Moderate (conscious) sedation was employed during this procedure. A total of Versed 2 mg and Fentanyl 100 mcg was administered intravenously. Moderate Sedation Time: 55 minutes. [name] level of consciousness and vital signs were monitored continuously by radiology nursing throughout the procedure under my direct supervision. CONTRAST:  30mL OMNIPAQUE IOHEXOL 300 MG/ML SOLN, 40mL OMNIPAQUE IOHEXOL 300 MG/ML SOLN FLUOROSCOPY TIME:  Fluoroscopic dose; 423 mGy COMPLICATIONS: None immediate. TECHNIQUE: Informed written consent was obtained from the patient and/or patient's representative after a discussion of the risks, benefits and alternatives to treatment. Questions regarding the procedure were encouraged and answered. A timeout was performed prior to the initiation of the procedure. Ultrasound scanning was performed of the RIGHT groin and demonstrated patency of the common femoral vein. As such, the RIGHT common femoral vein was selected venous access. The RIGHT groin was prepped and draped in the usual sterile fashion, and a sterile drape was applied covering the operative field. Maximum barrier sterile technique with sterile gowns and gloves were used for the procedure. A timeout was performed prior to the initiation of the procedure. Local anesthesia was provided with 1% lidocaine. Under direct ultrasound guidance, the RIGHT common femoral vein was accessed with a micro puncture kit ultimately allowing placement of a 6 Fr 10 cm vascular sheath. Slightly cranial to this initial access, the RIGHT common femoral was again accessed with an additional micropuncture kit ultimately allowing placement of an additional 6 Fr 10 cm vascular sheath. Ultrasound images were saved for procedural documentation purposes. With the use of a Bentson wire, an angled pulmonary catheter was advanced into the main  pulmonary artery and a limited central pulmonary arteriogram was performed.  Pressure measurements were then obtained from the main pulmonary artery. A 5 Fr angled pigtail catheter was advanced into the distal branch of the LEFT lower lobe pulmonary artery. Limited contrast injection confirmed appropriate positioning. Over an exchange length Pollyann Kennedy, the catheter was exchanged for a 90/10 cm multi side-hole infusion catheter. The procedure was then repeated on the patient's RIGHT pulmonary artery, and with the use of a stiff Rosen wire, access was obtained into a distal branch of the RIGHT lower lobe pulmonary artery allowing placement of a 90/10 cm multi side-hole infusion catheter. A postprocedural fluoroscopic image was obtained to document final catheter positioning. The external catheter tubing was secured at the right thigh and the lytic therapy was initiated. The patient tolerated the procedure well without immediate postprocedural complication. FINDINGS: Central pulmonary arteriogram demonstrates central pulmonary emboli at the BILATERAL pulmonary arterial bifurcations. Acquired pressure measurements: Main  pulmonary artery; 58/19, mean 35 (normal: < 25/10) Following the procedure, both infusion catheter tips terminate within the distal aspects of the bilateral lower lobe sub segmental pulmonary arteries. IMPRESSION: Successful initiation of BILATERAL catheter-directed pulmonary arterial lysis for submassive pulmonary embolism and RIGHT heart strain. PLAN: Lytic catheter infusion rates as described per order set. Vascular Interventional Radiology will follow the patient with anticipated catheter removal during rounds tomorrow. Roanna Banning, MD [facility]iology [facility] Radiology Electronically Signed   By: Roanna Banning M.D.   On: 01/31/2023 08:47   IR Angiogram Selective Each Additional Vessel Result Date: 01/31/2023 INDICATION: Syncope with submassive PE. RIGHT heart strain  RV LV ratio 2.4. POSITIVE troponins. EXAM: Procedures: 1. PULMONARY ARTERIOGRAPHY 2. PLACEMENT OF BILATERAL PULMONARY ARTERIAL LYTIC INFUSION CATHETERS COMPARISON:  Chest XR and CTA PE, 01/29/2023. MEDICATIONS: None ANESTHESIA/SEDATION: Moderate (conscious) sedation was employed during this procedure. A total of Versed 2 mg and Fentanyl 100 mcg was administered intravenously. Moderate Sedation Time: 55 minutes. [name] level of consciousness and vital signs were monitored continuously by radiology nursing throughout the procedure under my direct supervision. CONTRAST:  30mL OMNIPAQUE IOHEXOL 300 MG/ML SOLN, 40mL OMNIPAQUE IOHEXOL 300 MG/ML SOLN FLUOROSCOPY TIME:  Fluoroscopic dose; 423 mGy COMPLICATIONS: None immediate. TECHNIQUE: Informed written consent was obtained from the patient and/or patient's representative after a discussion of the risks, benefits and alternatives to treatment. Questions regarding the procedure were encouraged and answered. A timeout was performed prior to the initiation of the procedure. Ultrasound scanning was performed of the RIGHT groin and demonstrated patency of the common femoral vein. As such, the RIGHT common femoral vein was selected venous access. The RIGHT groin was prepped and draped in the usual sterile fashion, and a sterile drape was applied covering the operative field. Maximum barrier sterile technique with sterile gowns and gloves were used for the procedure. A timeout was performed prior to the initiation of the procedure. Local anesthesia was provided with 1% lidocaine. Under direct ultrasound guidance, the RIGHT common femoral vein was accessed with a micro puncture kit ultimately allowing placement of a 6 Fr 10 cm vascular sheath. Slightly cranial to this initial access, the RIGHT common femoral was again accessed with an additional micropuncture kit ultimately allowing placement of an additional 6 Fr 10 cm vascular sheath. Ultrasound images were saved for  procedural documentation purposes. With the use of a Bentson wire, an angled pulmonary catheter was advanced into the main pulmonary artery and a limited central pulmonary arteriogram was performed. Pressure measurements were then obtained from the main pulmonary artery. A 5 Fr angled pigtail catheter was  advanced into the distal branch of the LEFT lower lobe pulmonary artery. Limited contrast injection confirmed appropriate positioning. Over an exchange length Pollyann Kennedy, the catheter was exchanged for a 90/10 cm multi side-hole infusion catheter. The procedure was then repeated on the patient's RIGHT pulmonary artery, and with the use of a stiff Rosen wire, access was obtained into a distal branch of the RIGHT lower lobe pulmonary artery allowing placement of a 90/10 cm multi side-hole infusion catheter. A postprocedural fluoroscopic image was obtained to document final catheter positioning. The external catheter tubing was secured at the right thigh and the lytic therapy was initiated. The patient tolerated the procedure well without immediate postprocedural complication. FINDINGS: Central pulmonary arteriogram demonstrates central pulmonary emboli at the BILATERAL pulmonary arterial bifurcations. Acquired pressure measurements: Main  pulmonary artery; 58/19, mean 35 (normal: < 25/10) Following the procedure, both infusion catheter tips terminate within the distal aspects of the bilateral lower lobe sub segmental pulmonary arteries. IMPRESSION: Successful initiation of BILATERAL catheter-directed pulmonary arterial lysis for submassive pulmonary embolism and RIGHT heart strain. PLAN: Lytic catheter infusion rates as described per order set. Vascular Interventional Radiology will follow the patient with anticipated catheter removal during rounds tomorrow. Roanna Banning, MD [facility] Interventional Radiology Specialists [facility] Radiology Electronically Signed   By: Roanna Banning M.D.   On: 01/31/2023 08:46    ECHOCARDIOGRAM COMPLETE Result Date: 01/30/2023    ECHOCARDIOGRAM REPORT   Patient Name:   Nicholas [name] Date of Exam: 01/30/2023 Medical Rec #:  [id number]        Height:       73.0 in Accession #:    [id number]       Weight:       329.6 lb Date of Birth:  [birth year]        BSA:          2.661 m Patient Age:    52 years         BP:           100/69 mmHg Patient Gender: M                HR:           82 bpm. Exam Location:  Inpatient Procedure: 2D Echo, Color Doppler, Cardiac Doppler and Intracardiac            Opacification Agent Indications:    Pulmonary Thromboembolism  History:        Patient has prior history of Echocardiogram examinations, most                 recent 06/21/2021. Pulmonary Thromboembolism,                 Signs/Symptoms:Dyspnea; Risk Factors:Diabetes.  Sonographer:    Raeford Razor RDCS Referring Phys: [phone] WHITNEY D HARRIS IMPRESSIONS  1. Left ventricular ejection fraction, by estimation, is 65 to 70%. The left ventricle has normal function. The left ventricle has no regional wall motion abnormalities. Left ventricular diastolic parameters are indeterminate.  2. Right ventricular systolic function is mildly reduced. The right ventricular size is mildly enlarged. Tricuspid regurgitation signal is inadequate for assessing PA pressure.  3. The mitral valve is grossly normal. No evidence of mitral valve regurgitation. No evidence of mitral stenosis.  4. The aortic valve is grossly normal. Aortic valve regurgitation is not visualized. No aortic stenosis is present.  5. The inferior vena cava is dilated in size with >50% respiratory variability, suggesting right atrial pressure of 8 mmHg.  FINDINGS  Left Ventricle: Left ventricular ejection fraction, by estimation, is 65 to 70%. The left ventricle has normal function. The left ventricle has no regional wall motion abnormalities. The left ventricular internal cavity size was normal in size. There is  no left ventricular hypertrophy. Left ventricular  diastolic parameters are indeterminate. Right Ventricle: The right ventricular size is mildly enlarged. No increase in right ventricular wall thickness. Right ventricular systolic function is mildly reduced. Tricuspid regurgitation signal is inadequate for assessing PA pressure. Left Atrium: Left atrial size was normal in size. Right Atrium: Right atrial size was normal in size. Pericardium: There is no evidence of pericardial effusion. Presence of epicardial fat layer. Mitral Valve: The mitral valve is grossly normal. No evidence of mitral valve regurgitation. No evidence of mitral valve stenosis. Tricuspid Valve: The tricuspid valve is normal in structure. Tricuspid valve regurgitation is trivial. No evidence of tricuspid stenosis. Aortic Valve: The aortic valve is grossly normal. Aortic valve regurgitation is not visualized. No aortic stenosis is present. Aortic valve mean gradient measures 2.0 mmHg. Aortic valve peak gradient measures 3.8 mmHg. Aortic valve area, by VTI measures 4.16 cm. Pulmonic Valve: The pulmonic valve was normal in structure. Pulmonic valve regurgitation is trivial. No evidence of pulmonic stenosis. Aorta: The aortic root is normal in size and structure. Venous: The inferior vena cava is dilated in size with greater than 50% respiratory variability, suggesting right atrial pressure of 8 mmHg. IAS/Shunts: No atrial level shunt detected by color flow Doppler.  LEFT VENTRICLE PLAX 2D LVIDd:         4.50 cm      Diastology LVIDs:         3.50 cm      LV e' medial:    5.00 cm/s LV PW:         1.30 cm      LV E/e' medial:  12.9 LV IVS:        0.90 cm      LV e' lateral:   6.96 cm/s LVOT diam:     2.50 cm      LV E/e' lateral: 9.3 LV SV:         69 LV SV Index:   26 LVOT Area:     4.91 cm  LV Volumes (MOD) LV vol d, MOD A2C: 88.4 ml LV vol d, MOD A4C: 129.0 ml LV vol s, MOD A2C: 39.6 ml LV vol s, MOD A4C: 53.1 ml LV SV MOD A2C:     48.8 ml LV SV MOD A4C:     129.0 ml LV SV MOD BP:      61.9 ml  RIGHT VENTRICLE             IVC RV Basal diam:  3.30 cm     IVC diam: 2.00 cm RV Mid diam:    2.70 cm RV S prime:     13.20 cm/s TAPSE (M-mode): 1.6 cm LEFT ATRIUM           Index        RIGHT ATRIUM           Index LA Vol (A2C): 49.8 ml 18.72 ml/m  RA Area:     10.50 cm                                    RA Volume:   17.90 ml  6.73 ml/m  AORTIC VALVE  AV Area (Vmax):    4.17 cm AV Area (Vmean):   4.11 cm AV Area (VTI):     4.16 cm AV Vmax:           98.00 cm/s AV Vmean:          65.350 cm/s AV VTI:            0.166 m AV Peak Grad:      3.8 mmHg AV Mean Grad:      2.0 mmHg LVOT Vmax:         83.30 cm/s LVOT Vmean:        54.700 cm/s LVOT VTI:          0.141 m LVOT/AV VTI ratio: 0.85  AORTA Ao Root diam: 3.50 cm Ao Asc diam:  2.60 cm MITRAL VALVE MV Area (PHT): 5.31 cm    SHUNTS MV Decel Time: 143 msec    Systemic VTI:  0.14 m MV E velocity: 64.50 cm/s  Systemic Diam: 2.50 cm MV A velocity: 50.80 cm/s MV E/A ratio:  1.27 Weston Brass MD Electronically signed by Weston Brass MD Signature Date/Time: 01/30/2023/9:38:25 PM    Final    CT RENAL STONE STUDY Result Date: 01/30/2023 CLINICAL DATA:  Abdominal and flank pain.  Nephrolithiasis. EXAM: CT ABDOMEN AND PELVIS WITHOUT CONTRAST TECHNIQUE: Multidetector CT imaging of the abdomen and pelvis was performed following the standard protocol without IV contrast. RADIATION DOSE REDUCTION: This exam was performed according to the departmental dose-optimization program which includes automated exposure control, adjustment of the mA and/or kV according to patient size and/or use of iterative reconstruction technique. COMPARISON:  Chest CTA on 01/29/2023, and AP CT on 12/21/2020 FINDINGS: Lower chest: New sub-solid opacity in the posterior right lower lobe which measures 4.9 x 3.0 cm, consistent with pulmonary infarct given pulmonary emboli shown on chest CT performed yesterday. Hepatobiliary: No mass visualized on this unenhanced exam. Gallbladder contains contrast  material attributed to IV contrast administered yesterday, but is otherwise unremarkable. Pancreas: No mass or inflammatory process visualized on this unenhanced exam. Spleen:  Within normal limits in size. Adrenals/Urinary tract: Bilateral renal calculi are again seen, largest measuring 14 mm in lower pole of left kidney. Mild-to-moderate left hydronephrosis is seen due to a 8 mm calculus at the left UPJ. Normal appearance of bladder. Stomach/Bowel: No evidence of obstruction, inflammatory process, or abnormal fluid collections. Normal appendix visualized. Diverticulosis is seen mainly involving the sigmoid colon, however there is no evidence of diverticulitis. Vascular/Lymphatic: No pathologically enlarged lymph nodes identified. No evidence of abdominal aortic aneurysm. Reproductive:  No mass or other significant abnormality. Other:  Mild hemorrhage seen in right groin soft tissues. Musculoskeletal:  No suspicious bone lesions identified. IMPRESSION: Mild-to-moderate left hydronephrosis due to 8 mm calculus at the left UPJ. Bilateral nephrolithiasis. Colonic diverticulosis, without radiographic evidence of diverticulitis. Mild hemorrhage in right groin soft tissues. New 4.9 cm sub-solid opacity in posterior right lower lobe, consistent with pulmonary infarct given acute pulmonary embolism shown on chest CTA performed yesterday. Electronically Signed   By: Danae Orleans M.D.   On: 01/30/2023 12:14   IR THROMB F/U EVAL ART/VEN FINAL DAY (MS) Result Date: 01/30/2023 INDICATION: History of sub massive pulmonary embolism, post initiation of bilateral pulmonary arterial thrombolysis on 01/29/2023. Patient has completed prescribed course of bilateral pulmonary arterial lytic infusion and presents today for repeat pulmonary arterial pressure measurements prior to infusion catheter removal. EXAM: PULMONARY ARTERIAL PRESSURE MEASUREMENT AND INFUSION CATHETER(S) REMOVAL COMPARISON:  IR fluoroscopy, 01/29/2023.  Chest XR  earlier same day. CTA PE, 01/29/2023 MEDICATIONS: None CONTRAST:  None FLUOROSCOPY TIME:  None COMPLICATIONS: None immediate. TECHNIQUE: The patient was positioned supine on there hospital bed. Review of this morning's chest radiograph demonstrates unchanged positioning of bilateral pulmonary arterial infusion catheters with tip of the right-sided pulmonary arterial infusion catheter approximately 5-10 cm peripheral to the expected outflow of the main pulmonary artery. As such, the right pulmonary arterial catheter's infusion wire was removed and the multi side-hole infusion catheter was retracted to the estimated location of the main pulmonary artery. Pressure measurements were acquired from this location. At this point, the procedure was terminated. All wires, catheters and sheaths were removed from the patient. Hemostasis was achieved at the right groin access site with manual compression. A dressing was placed. The patient tolerated the procedure well without immediate postprocedural complication. FINDINGS: Acquired pressure measurements as follows: Preprocedural main pulmonary artery-58/19; mean-35 (normal: < 25/10) Postprocedural main pulmonary artery-31/14; mean-20 IMPRESSION: Successful BILATERAL catheter directed pulmonary arterial thrombolysis with reduction (15 mmHg) in pressure within the main pulmonary artery. Roanna Banning, MD [facility] Radiology Electronically Signed   By: Roanna Banning M.D.   On: 01/30/2023 09:29   IR INFUSION THROMBOL ARTERIAL INITIAL (MS) Result Date: 01/30/2023 INDICATION: Syncope with submassive PE. RIGHT heart strain RV LV ratio 2.4. POSITIVE troponins. EXAM: Procedures: 1. PULMONARY ARTERIOGRAPHY 2. PLACEMENT OF BILATERAL PULMONARY ARTERIAL LYTIC INFUSION CATHETERS COMPARISON:  Chest XR and CTA PE, 01/29/2023. MEDICATIONS: None ANESTHESIA/SEDATION: Moderate (conscious) sedation was employed during this procedure. A total of  Versed 2 mg and Fentanyl 100 mcg was administered intravenously. Moderate Sedation Time: 55 minutes. [name] level of consciousness and vital signs were monitored continuously by radiology nursing throughout the procedure under my direct supervision. CONTRAST:  30mL OMNIPAQUE IOHEXOL 300 MG/ML SOLN, 40mL OMNIPAQUE IOHEXOL 300 MG/ML SOLN FLUOROSCOPY TIME:  Fluoroscopic dose; 423 mGy COMPLICATIONS: None immediate. TECHNIQUE: Informed written consent was obtained from the patient and/or patient's representative after a discussion of the risks, benefits and alternatives to treatment. Questions regarding the procedure were encouraged and answered. A timeout was performed prior to the initiation of the procedure. Ultrasound scanning was performed of the RIGHT groin and demonstrated patency of the common femoral vein. As such, the RIGHT common femoral vein was selected venous access. The RIGHT groin was prepped and draped in the usual sterile fashion, and a sterile drape was applied covering the operative field. Maximum barrier sterile technique with sterile gowns and gloves were used for the procedure. A timeout was performed prior to the initiation of the procedure. Local anesthesia was provided with 1% lidocaine. Under direct ultrasound guidance, the RIGHT common femoral vein was accessed with a micro puncture kit ultimately allowing placement of a 6 Fr 10 cm vascular sheath. Slightly cranial to this initial access, the RIGHT common femoral was again accessed with an additional micropuncture kit ultimately allowing placement of an additional 6 Fr 10 cm vascular sheath. Ultrasound images were saved for procedural documentation purposes. With the use of a Bentson wire, an angled pulmonary catheter was advanced into the main pulmonary artery and a limited central pulmonary arteriogram was performed. Pressure measurements were then obtained from the main pulmonary artery. A 5 Fr angled pigtail catheter was advanced  into the distal branch of the LEFT lower lobe pulmonary artery. Limited contrast injection confirmed appropriate positioning. Over an exchange length Pollyann Kennedy, the catheter was exchanged for a 90/10 cm multi side-hole infusion catheter. The procedure was  then repeated on the patient's RIGHT pulmonary artery, and with the use of a stiff Rosen wire, access was obtained into a distal branch of the RIGHT lower lobe pulmonary artery allowing placement of a 90/10 cm multi side-hole infusion catheter. A postprocedural fluoroscopic image was obtained to document final catheter positioning. The external catheter tubing was secured at the right thigh and the lytic therapy was initiated. The patient tolerated the procedure well without immediate postprocedural complication. FINDINGS: Central pulmonary arteriogram demonstrates central pulmonary emboli at the BILATERAL pulmonary arterial bifurcations. Acquired pressure measurements: Main  pulmonary artery; 58/19, mean 35 (normal: < 25/10) Following the procedure, both infusion catheter tips terminate within the distal aspects of the bilateral lower lobe sub segmental pulmonary arteries. IMPRESSION: Successful initiation of BILATERAL catheter-directed pulmonary arterial lysis for submassive pulmonary embolism and RIGHT heart strain. PLAN: Lytic catheter infusion rates as described per order set. Vascular Interventional Radiology will follow the patient with anticipated catheter removal during rounds tomorrow. Roanna Banning, MD [facility] Radiology Electronically Signed   By: Roanna Banning M.D.   On: 01/30/2023 09:23   IR US Guide Vasc Access Right Result Date: 01/30/2023 INDICATION: Syncope with submassive PE. RIGHT heart strain RV LV ratio 2.4. POSITIVE troponins. EXAM: Procedures: 1. PULMONARY ARTERIOGRAPHY 2. PLACEMENT OF BILATERAL PULMONARY ARTERIAL LYTIC INFUSION CATHETERS COMPARISON:  Chest XR and CTA PE, 01/29/2023.  MEDICATIONS: None ANESTHESIA/SEDATION: Moderate (conscious) sedation was employed during this procedure. A total of Versed 2 mg and Fentanyl 100 mcg was administered intravenously. Moderate Sedation Time: 55 minutes. [name] level of consciousness and vital signs were monitored continuously by radiology nursing throughout the procedure under my direct supervision. CONTRAST:  30mL OMNIPAQUE IOHEXOL 300 MG/ML SOLN, 40mL OMNIPAQUE IOHEXOL 300 MG/ML SOLN FLUOROSCOPY TIME:  Fluoroscopic dose; 423 mGy COMPLICATIONS: None immediate. TECHNIQUE: Informed written consent was obtained from the patient and/or patient's representative after a discussion of the risks, benefits and alternatives to treatment. Questions regarding the procedure were encouraged and answered. A timeout was performed prior to the initiation of the procedure. Ultrasound scanning was performed of the RIGHT groin and demonstrated patency of the common femoral vein. As such, the RIGHT common femoral vein was selected venous access. The RIGHT groin was prepped and draped in the usual sterile fashion, and a sterile drape was applied covering the operative field. Maximum barrier sterile technique with sterile gowns and gloves were used for the procedure. A timeout was performed prior to the initiation of the procedure. Local anesthesia was provided with 1% lidocaine. Under direct ultrasound guidance, the RIGHT common femoral vein was accessed with a micro puncture kit ultimately allowing placement of a 6 Fr 10 cm vascular sheath. Slightly cranial to this initial access, the RIGHT common femoral was again accessed with an additional micropuncture kit ultimately allowing placement of an additional 6 Fr 10 cm vascular sheath. Ultrasound images were saved for procedural documentation purposes. With the use of a Bentson wire, an angled pulmonary catheter was advanced into the main pulmonary artery and a limited central pulmonary arteriogram was performed.  Pressure measurements were then obtained from the main pulmonary artery. A 5 Fr angled pigtail catheter was advanced into the distal branch of the LEFT lower lobe pulmonary artery. Limited contrast injection confirmed appropriate positioning. Over an exchange length Pollyann Kennedy, the catheter was exchanged for a 90/10 cm multi side-hole infusion catheter. The procedure was then repeated on the patient's RIGHT pulmonary artery, and with the use of a stiff Rosen wire,  access was obtained into a distal branch of the RIGHT lower lobe pulmonary artery allowing placement of a 90/10 cm multi side-hole infusion catheter. A postprocedural fluoroscopic image was obtained to document final catheter positioning. The external catheter tubing was secured at the right thigh and the lytic therapy was initiated. The patient tolerated the procedure well without immediate postprocedural complication. FINDINGS: Central pulmonary arteriogram demonstrates central pulmonary emboli at the BILATERAL pulmonary arterial bifurcations. Acquired pressure measurements: Main  pulmonary artery; 58/19, mean 35 (normal: < 25/10) Following the procedure, both infusion catheter tips terminate within the distal aspects of the bilateral lower lobe sub segmental pulmonary arteries. IMPRESSION: Successful initiation of BILATERAL catheter-directed pulmonary arterial lysis for submassive pulmonary embolism and RIGHT heart strain. PLAN: Lytic catheter infusion rates as described per order set. Vascular Interventional Radiology will follow the patient with anticipated catheter removal during rounds tomorrow. Roanna Banning, MD [facility] Radiology Electronically Signed   By: Roanna Banning M.D.   On: 01/30/2023 09:23   IR Angiogram Pulmonary Bilateral Selective Result Date: 01/30/2023 INDICATION: Syncope with submassive PE. RIGHT heart strain RV LV ratio 2.4. POSITIVE troponins. EXAM: Procedures: 1. PULMONARY  ARTERIOGRAPHY 2. PLACEMENT OF BILATERAL PULMONARY ARTERIAL LYTIC INFUSION CATHETERS COMPARISON:  Chest XR and CTA PE, 01/29/2023. MEDICATIONS: None ANESTHESIA/SEDATION: Moderate (conscious) sedation was employed during this procedure. A total of Versed 2 mg and Fentanyl 100 mcg was administered intravenously. Moderate Sedation Time: 55 minutes. [name] level of consciousness and vital signs were monitored continuously by radiology nursing throughout the procedure under my direct supervision. CONTRAST:  30mL OMNIPAQUE IOHEXOL 300 MG/ML SOLN, 40mL OMNIPAQUE IOHEXOL 300 MG/ML SOLN FLUOROSCOPY TIME:  Fluoroscopic dose; 423 mGy COMPLICATIONS: None immediate. TECHNIQUE: Informed written consent was obtained from the patient and/or patient's representative after a discussion of the risks, benefits and alternatives to treatment. Questions regarding the procedure were encouraged and answered. A timeout was performed prior to the initiation of the procedure. Ultrasound scanning was performed of the RIGHT groin and demonstrated patency of the common femoral vein. As such, the RIGHT common femoral vein was selected venous access. The RIGHT groin was prepped and draped in the usual sterile fashion, and a sterile drape was applied covering the operative field. Maximum barrier sterile technique with sterile gowns and gloves were used for the procedure. A timeout was performed prior to the initiation of the procedure. Local anesthesia was provided with 1% lidocaine. Under direct ultrasound guidance, the RIGHT common femoral vein was accessed with a micro puncture kit ultimately allowing placement of a 6 Fr 10 cm vascular sheath. Slightly cranial to this initial access, the RIGHT common femoral was again accessed with an additional micropuncture kit ultimately allowing placement of an additional 6 Fr 10 cm vascular sheath. Ultrasound images were saved for procedural documentation purposes. With the use of a Bentson wire, an  angled pulmonary catheter was advanced into the main pulmonary artery and a limited central pulmonary arteriogram was performed. Pressure measurements were then obtained from the main pulmonary artery. A 5 Fr angled pigtail catheter was advanced into the distal branch of the LEFT lower lobe pulmonary artery. Limited contrast injection confirmed appropriate positioning. Over an exchange length Pollyann Kennedy, the catheter was exchanged for a 90/10 cm multi side-hole infusion catheter. The procedure was then repeated on the patient's RIGHT pulmonary artery, and with the use of a stiff Rosen wire, access was obtained into a distal branch of the RIGHT lower lobe pulmonary artery allowing placement of a  90/10 cm multi side-hole infusion catheter. A postprocedural fluoroscopic image was obtained to document final catheter positioning. The external catheter tubing was secured at the right thigh and the lytic therapy was initiated. The patient tolerated the procedure well without immediate postprocedural complication. FINDINGS: Central pulmonary arteriogram demonstrates central pulmonary emboli at the BILATERAL pulmonary arterial bifurcations. Acquired pressure measurements: Main  pulmonary artery; 58/19, mean 35 (normal: < 25/10) Following the procedure, both infusion catheter tips terminate within the distal aspects of the bilateral lower lobe sub segmental pulmonary arteries. IMPRESSION: Successful initiation of BILATERAL catheter-directed pulmonary arterial lysis for submassive pulmonary embolism and RIGHT heart strain. PLAN: Lytic catheter infusion rates as described per order set. Vascular Interventional Radiology will follow the patient with anticipated catheter removal during rounds tomorrow. Roanna Banning, MD [facility] Radiology Electronically Signed   By: Roanna Banning M.D.   On: 01/30/2023 09:23   DG Chest Port 1 View Result Date: 01/30/2023 CLINICAL DATA:  Submassive PE.  Thrombolysis. [id number] Post-operative state [id number] EXAM: PORTABLE CHEST 1 VIEW COMPARISON:  Chest XR and CTA chest, 01/29/2023. IR fluoroscopy, 01/29/2023. FINDINGS: Support lines; stable positioning of BILATERAL pulmonary arterial infusion catheters. Cardiomediastinal silhouette is within normal limits. Lungs are well inflated. No focal consolidation or mass. No pleural effusion or pneumothorax. No acute displaced fracture. IMPRESSION: 1. Stable positioning of BILATERAL pulmonary arterial infusion catheters. 2. No acute superimposed cardiopulmonary process. Electronically Signed   By: Roanna Banning M.D.   On: 01/30/2023 08:22   DG Abd 1 View Result Date: 01/30/2023 CLINICAL DATA:  Acute abdominal and back pain EXAM: ABDOMEN - 1 VIEW COMPARISON:  Abdominal CT 12/21/2020 FINDINGS: Pulmonary artery catheters over the abdomen are unremarkable. Renal calculi, numerous on the right. Bowel gas pattern is normal. IMPRESSION: Normal bowel gas pattern. Unremarkable intra-abdominal position of pulmonary artery catheters. Electronically Signed   By: Tiburcio Pea M.D.   On: 01/30/2023 06:49   CT Angio Chest PE W and/or Wo Contrast Result Date: 01/29/2023 CLINICAL DATA:  Short of breath.  Elevated D-dimer. EXAM: CT ANGIOGRAPHY CHEST WITH CONTRAST TECHNIQUE: Multidetector CT imaging of the chest was performed using the standard protocol during bolus administration of intravenous contrast. Multiplanar CT image reconstructions and MIPs were obtained to evaluate the vascular anatomy. RADIATION DOSE REDUCTION: This exam was performed according to the departmental dose-optimization program which includes automated exposure control, adjustment of the mA and/or kV according to patient size and/or use of iterative reconstruction technique. CONTRAST:  75mL OMNIPAQUE IOHEXOL 350 MG/ML SOLN COMPARISON:  01/02/2022. FINDINGS: Cardiovascular: There are multiple bilateral pulmonary emboli. Emboli noted in the main pulmonary arteries  extending into all segmental branches, slightly higher burden on the right than the left. A thin saddle embolus crosses from the left to right pulmonary arteries. RV LV ratio is 2.4, significantly elevated consistent with right heart strain. Heart is normal in overall size. No pericardial effusion. Aorta is normal in caliber. No dissection or atherosclerosis. Mediastinum/Nodes: No neck base, mediastinal or hilar masses. No enlarged lymph nodes. Trachea esophagus are unremarkable. Lungs/Pleura: Mild linear opacities, mostly in the lower lungs, consistent with atelectasis. No lung consolidation to suggest infarction. No pulmonary edema. No pleural effusion or pneumothorax. Upper Abdomen: No acute abnormality. Musculoskeletal: No acute fracture. No bone lesion. No chest wall mass. Review of the MIP images confirms the above findings. IMPRESSION: 1. Positive for acute bilateral pulmonary emboli. Multiple emboli in the main pulmonary arteries extending into all segmental branches. Thin saddle embolus. 2. Positive  for right heart strain (RV/LV Ratio = 2.4 ) consistent with at least submassive (intermediate risk) PE. The presence of right heart strain has been associated with an increased risk of morbidity and mortality. Please refer to the "Code PE Focused" order set in EPIC. Critical Value/emergent results were called by telephone at the time of interpretation on 01/29/2023 at 5:09 pm to provider [facility] , who verbally acknowledged these results. Electronically Signed   By: Amie Portland M.D.   On: 01/29/2023 17:09   DG Chest 2 View Result Date: 01/29/2023 CLINICAL DATA:  Chest pain. EXAM: CHEST - 2 VIEW COMPARISON:  04/07/2022 FINDINGS: Lungs are hypoinflated without focal airspace consolidation or effusion. Cardiomediastinal silhouette and remainder the exam is unchanged. IMPRESSION: Hypoinflation without acute cardiopulmonary disease. Electronically Signed   By: Elberta Fortis M.D.   On: 01/29/2023 15:18       Subjective: Patient seen and examined at bedside.  Feels okay to go home today.  Denies worsening shortness of breath, chest pain, fever or vomiting.  Discharge Exam: Vitals:   02/01/23 2139 02/02/23 0445  BP: 128/81 133/80  Pulse: 75 69  Resp:    Temp: 98.2 F (36.8 C) 98 F (36.7 C)  SpO2: 95% 97%    General: Pt is alert, awake, not in acute distress.  On room air. Cardiovascular: rate controlled, S1/S2 + Respiratory: bilateral decreased breath sounds at bases Abdominal: Soft, morbidly obese, NT, ND, bowel sounds + Extremities: Trace lower extremity edema, no cyanosis    The results of significant diagnostics from this hospitalization (including imaging, microbiology, ancillary and laboratory) are listed below for reference.     Microbiology: Recent Results (from the past 240 hours)  Resp panel by RT-PCR (RSV, Flu A&B, Covid) Anterior Nasal Swab     Status: None   Collection Time: 01/29/23  3:25 PM   Specimen: Anterior Nasal Swab  Result Value Ref Range Status   SARS Coronavirus 2 by RT PCR NEGATIVE NEGATIVE Final   Influenza A by PCR NEGATIVE NEGATIVE Final   Influenza B by PCR NEGATIVE NEGATIVE Final    Comment: (NOTE) The Xpert Xpress SARS-CoV-2/FLU/RSV plus assay is intended as an aid in the diagnosis of influenza from Nasopharyngeal swab specimens and should not be used as a sole basis for treatment. Nasal washings and aspirates are unacceptable for Xpert Xpress SARS-CoV-2/FLU/RSV testing.  Fact Sheet for Patients: BloggerCourse.com  Fact Sheet for Healthcare Providers: SeriousBroker.it  This test is not yet approved or cleared by the [place] FDA and has been authorized for detection and/or diagnosis of SARS-CoV-2 by FDA under an Emergency Use Authorization (EUA). This EUA will remain in effect (meaning this test can be used) for the duration of the COVID-19 declaration under Section 564(b)(1)  of the Act, 21 U.S.C. section 360bbb-3(b)(1), unless the authorization is terminated or revoked.     Resp Syncytial Virus by PCR NEGATIVE NEGATIVE Final    Comment: (NOTE) Fact Sheet for Patients: BloggerCourse.com  Fact Sheet for Healthcare Providers: SeriousBroker.it  This test is not yet approved or cleared by the [place] FDA and has been authorized for detection and/or diagnosis of SARS-CoV-2 by FDA under an Emergency Use Authorization (EUA). This EUA will remain in effect (meaning this test can be used) for the duration of the COVID-19 declaration under Section 564(b)(1) of the Act, 21 U.S.C. section 360bbb-3(b)(1), unless the authorization is terminated or revoked.  Performed at [facility], [address] [place], [place] [address]  MRSA Next Gen by PCR, Nasal     Status: None   Collection Time: 01/29/23  8:48 PM   Specimen: Nasal Mucosa; Nasal Swab  Result Value Ref Range Status   MRSA by PCR Next Gen NOT DETECTED NOT DETECTED Final    Comment: (NOTE) The GeneXpert MRSA Assay (FDA approved for NASAL specimens only), is one component of a comprehensive MRSA colonization surveillance program. It is not intended to diagnose MRSA infection nor to guide or monitor treatment for MRSA infections. Test performance is not FDA approved in patients less than 60 years old. Performed at [facility], [address] [place], [place] [address]      Labs: BNP (last 3 results) Recent Labs    01/29/23 1519  BNP 29.9   Basic Metabolic Panel: Recent Labs  Lab 01/29/23 1510 01/29/23 1521 01/29/23 1526 01/30/23 0428 02/01/23 0510 02/02/23 0435  NA 138 142 141 140 136 140  K 4.1 4.1 4.2 3.9 3.7 3.8  CL 103  --  106 110 106 107  CO2 22  --   --  19* 22 24  GLUCOSE 173*  --  169* 144* 111* 105*  BUN 11  --  15 11 11 12   CREATININE 1.19  --  1.10 0.98 0.89 0.96  CALCIUM 9.8  --   --  8.6* 8.6* 8.7*   MG  --   --   --  2.0  --  1.9  PHOS  --   --   --  2.8  --   --    Liver Function Tests: Recent Labs  Lab 01/29/23 1510  AST 53*  ALT 51*  ALKPHOS 75  BILITOT 1.0  PROT 7.7  ALBUMIN 3.7   No results for input(s): "LIPASE", "AMYLASE" in the last 168 hours. No results for input(s): "AMMONIA" in the last 168 hours. CBC: Recent Labs  Lab 01/29/23 1510 01/29/23 1521 01/30/23 0428 01/30/23 1233 01/31/23 0455 02/01/23 0510 02/02/23 0435  WBC 22.2*   < > 18.4* 13.7* 11.7* 10.5 10.9*  NEUTROABS 18.0*  --   --   --   --   --   --   HGB 16.9   < > 15.2 14.3 13.6 13.3 13.6  HCT 50.2   < > 45.4 42.7 40.8 39.3 40.4  MCV 92.1   < > 90.8 92.2 92.3 90.1 90.0  PLT 238   < > 170 156 148* 156 161   < > = values in this interval not displayed.   Cardiac Enzymes: No results for input(s): "CKTOTAL", "CKMB", "CKMBINDEX", "TROPONINI" in the last 168 hours. BNP: Invalid input(s): "POCBNP" CBG: Recent Labs  Lab 02/01/23 1109 02/01/23 1648 02/01/23 2006 02/01/23 2353 02/02/23 0445  GLUCAP 110* 102* 95 94 93   D-Dimer No results for input(s): "DDIMER" in the last 72 hours. Hgb A1c No results for input(s): "HGBA1C" in the last 72 hours. Lipid Profile No results for input(s): "CHOL", "HDL", "LDLCALC", "TRIG", "CHOLHDL", "LDLDIRECT" in the last 72 hours. Thyroid function studies No results for input(s): "TSH", "T4TOTAL", "T3FREE", "THYROIDAB" in the last 72 hours.  Invalid input(s): "FREET3" Anemia work up No results for input(s): "VITAMINB12", "FOLATE", "FERRITIN", "TIBC", "IRON", "RETICCTPCT" in the last 72 hours. Urinalysis    Component Value Date/Time   COLORURINE YELLOW 01/30/2023 1221   APPEARANCEUR HAZY (A) 01/30/2023 1221   LABSPEC 1.025 01/30/2023 1221   PHURINE 5.0 01/30/2023 1221   GLUCOSEU NEGATIVE 01/30/2023 1221   HGBUR LARGE (A) 01/30/2023 1221  BILIRUBINUR NEGATIVE 01/30/2023 1221   KETONESUR NEGATIVE 01/30/2023 1221   PROTEINUR NEGATIVE 01/30/2023 1221    NITRITE NEGATIVE 01/30/2023 1221   LEUKOCYTESUR TRACE (A) 01/30/2023 1221   Sepsis Labs Recent Labs  Lab 01/30/23 1233 01/31/23 0455 02/01/23 0510 02/02/23 0435  WBC 13.7* 11.7* 10.5 10.9*   Microbiology Recent Results (from the past 240 hours)  Resp panel by RT-PCR (RSV, Flu A&B, Covid) Anterior Nasal Swab     Status: None   Collection Time: 01/29/23  3:25 PM   Specimen: Anterior Nasal Swab  Result Value Ref Range Status   SARS Coronavirus 2 by RT PCR NEGATIVE NEGATIVE Final   Influenza A by PCR NEGATIVE NEGATIVE Final   Influenza B by PCR NEGATIVE NEGATIVE Final    Comment: (NOTE) The Xpert Xpress SARS-CoV-2/FLU/RSV plus assay is intended as an aid in the diagnosis of influenza from Nasopharyngeal swab specimens and should not be used as a sole basis for treatment. Nasal washings and aspirates are unacceptable for Xpert Xpress SARS-CoV-2/FLU/RSV testing.  Fact Sheet for Patients: BloggerCourse.com  Fact Sheet for Healthcare Providers: SeriousBroker.it  This test is not yet approved or cleared by the [place] FDA and has been authorized for detection and/or diagnosis of SARS-CoV-2 by FDA under an Emergency Use Authorization (EUA). This EUA will remain in effect (meaning this test can be used) for the duration of the COVID-19 declaration under Section 564(b)(1) of the Act, 21 U.S.C. section 360bbb-3(b)(1), unless the authorization is terminated or revoked.     Resp Syncytial Virus by PCR NEGATIVE NEGATIVE Final    Comment: (NOTE) Fact Sheet for Patients: BloggerCourse.com  Fact Sheet for Healthcare Providers: SeriousBroker.it  This test is not yet approved or cleared by the [place] FDA and has been authorized for detection and/or diagnosis of SARS-CoV-2 by FDA under an Emergency Use Authorization (EUA). This EUA will remain in effect (meaning this test can  be used) for the duration of the COVID-19 declaration under Section 564(b)(1) of the Act, 21 U.S.C. section 360bbb-3(b)(1), unless the authorization is terminated or revoked.  Performed at [facility], [address] [place], [place] [address]   MRSA Next Gen by PCR, Nasal     Status: None   Collection Time: 01/29/23  8:48 PM   Specimen: Nasal Mucosa; Nasal Swab  Result Value Ref Range Status   MRSA by PCR Next Gen NOT DETECTED NOT DETECTED Final    Comment: (NOTE) The GeneXpert MRSA Assay (FDA approved for NASAL specimens only), is one component of a comprehensive MRSA colonization surveillance program. It is not intended to diagnose MRSA infection nor to guide or monitor treatment for MRSA infections. Test performance is not FDA approved in patients less than 67 years old. Performed at [facility], [address], [place], [place] [address]      Time coordinating discharge: 35 minutes  SIGNED:   Glade Lloyd, MD  [facility] 02/02/2023, 7:49 AM

## 2023-02-16 ENCOUNTER — Telehealth: Payer: Self-pay | Admitting: Pulmonary Disease

## 2023-02-16 NOTE — Telephone Encounter (Signed)
 Nicholas [name] states patient needs surgical clearance. Patient scheduled for surgery 02/22/2023. Scheduled patient 03/27/2023 with Nicholas Hick NP. [name] states needs surgical clearance as soon as possible. [name] phone number is [phone].

## 2023-02-17 NOTE — Telephone Encounter (Signed)
 Unfortunately this pt has not been seen here since 06/21/21   Will need appt to be cleared- his surgery is scheduled for next wk 02/22/23   He has appt here 03/27/23   I called and spoke with Kristin at [facility] and made her aware of this   Will hold in clearance pool

## 2023-02-20 ENCOUNTER — Encounter: Payer: Self-pay | Admitting: Oncology

## 2023-02-20 ENCOUNTER — Inpatient Hospital Stay: Payer: 59

## 2023-02-20 ENCOUNTER — Inpatient Hospital Stay: Payer: 59 | Attending: Oncology | Admitting: Oncology

## 2023-02-20 VITALS — BP 126/75 | HR 77 | Temp 97.0°F | Resp 19 | Wt 343.2 lb

## 2023-02-20 DIAGNOSIS — Z806 Family history of leukemia: Secondary | ICD-10-CM | POA: Insufficient documentation

## 2023-02-20 DIAGNOSIS — I2602 Saddle embolus of pulmonary artery with acute cor pulmonale: Secondary | ICD-10-CM

## 2023-02-20 DIAGNOSIS — I2692 Saddle embolus of pulmonary artery without acute cor pulmonale: Secondary | ICD-10-CM | POA: Insufficient documentation

## 2023-02-20 DIAGNOSIS — N200 Calculus of kidney: Secondary | ICD-10-CM | POA: Insufficient documentation

## 2023-02-20 DIAGNOSIS — I519 Heart disease, unspecified: Secondary | ICD-10-CM | POA: Diagnosis not present

## 2023-02-20 DIAGNOSIS — Z7901 Long term (current) use of anticoagulants: Secondary | ICD-10-CM | POA: Insufficient documentation

## 2023-02-20 DIAGNOSIS — Z87891 Personal history of nicotine dependence: Secondary | ICD-10-CM | POA: Diagnosis not present

## 2023-02-20 LAB — CBC WITH DIFFERENTIAL/PLATELET
Abs Immature Granulocytes: 0.12 10*3/uL — ABNORMAL HIGH (ref 0.00–0.07)
Basophils Absolute: 0.1 10*3/uL (ref 0.0–0.1)
Basophils Relative: 0 %
Eosinophils Absolute: 0.1 10*3/uL (ref 0.0–0.5)
Eosinophils Relative: 1 %
HCT: 40.2 % (ref 39.0–52.0)
Hemoglobin: 13.4 g/dL (ref 13.0–17.0)
Immature Granulocytes: 1 %
Lymphocytes Relative: 21 %
Lymphs Abs: 2.6 10*3/uL (ref 0.7–4.0)
MCH: 30.2 pg (ref 26.0–34.0)
MCHC: 33.3 g/dL (ref 30.0–36.0)
MCV: 90.5 fL (ref 80.0–100.0)
Monocytes Absolute: 0.8 10*3/uL (ref 0.1–1.0)
Monocytes Relative: 7 %
Neutro Abs: 8.7 10*3/uL — ABNORMAL HIGH (ref 1.7–7.7)
Neutrophils Relative %: 70 %
Platelets: 268 10*3/uL (ref 150–400)
RBC: 4.44 MIL/uL (ref 4.22–5.81)
RDW: 13.4 % (ref 11.5–15.5)
WBC: 12.4 10*3/uL — ABNORMAL HIGH (ref 4.0–10.5)
nRBC: 0 % (ref 0.0–0.2)

## 2023-02-20 LAB — COMPREHENSIVE METABOLIC PANEL
ALT: 41 U/L (ref 0–44)
AST: 32 U/L (ref 15–41)
Albumin: 3.3 g/dL — ABNORMAL LOW (ref 3.5–5.0)
Alkaline Phosphatase: 63 U/L (ref 38–126)
Anion gap: 7 (ref 5–15)
BUN: 11 mg/dL (ref 6–20)
CO2: 26 mmol/L (ref 22–32)
Calcium: 9 mg/dL (ref 8.9–10.3)
Chloride: 106 mmol/L (ref 98–111)
Creatinine, Ser: 0.87 mg/dL (ref 0.61–1.24)
GFR, Estimated: >60 mL/min (ref >60)
Glucose, Bld: 117 mg/dL — ABNORMAL HIGH (ref 70–99)
Potassium: 3.9 mmol/L (ref 3.5–5.1)
Sodium: 139 mmol/L (ref 135–145)
Total Bilirubin: 0.7 mg/dL (ref 0.0–1.2)
Total Protein: 6.7 g/dL (ref 6.5–8.1)

## 2023-02-20 LAB — ANTITHROMBIN III: AntiThromb III Func: 92 % (ref 75–120)

## 2023-02-20 MED ORDER — RIVAROXABAN 20 MG PO TABS: 20.0000 mg | ORAL_TABLET | Freq: Every day | ORAL | 1 refills | Status: AC

## 2023-02-20 NOTE — Progress Notes (Signed)
 [facility]  Telephone:([phone]) [phone] Fax:(336) [phone]  ID: Nicholas [name] OB: 05/27/1970  MR#: [id number]  WCB#:[id number]  Patient Care Team: Marce Sensing, NP as PCP - General Lucendia Rusk, MD as PCP - Cardiology (Cardiology)  CHIEF COMPLAINT: Saddle pulmonary embolism with heart strain.  INTERVAL HISTORY: Patient is a 53 year old male with history of PE approximately 3 or 4 years ago who recently was discharged from the hospital with a submassive saddle pulmonary embolism with heart strain.  He is referred for anticoagulation recommendations.  He currently feels well and is back to his baseline.  He has no neurological plaints.  He denies any recent fevers or illnesses.  He has a good appetite and denies weight loss.  He has no chest pain, shortness of breath, cough, or hemoptysis.  He denies any nausea, vomiting, constipation, or diarrhea.  He has no urinary complaints.  Patient offers no specific complaints today.  REVIEW OF SYSTEMS:   Review of Systems  Constitutional: Negative.  Negative for fever, malaise/fatigue and weight loss.  Respiratory: Negative.  Negative for cough, hemoptysis and shortness of breath.   Cardiovascular: Negative.  Negative for chest pain and leg swelling.  Gastrointestinal: Negative.  Negative for abdominal pain.  Genitourinary: Negative.  Negative for dysuria and flank pain.  Musculoskeletal: Negative.  Negative for back pain.  Skin: Negative.  Negative for rash.  Neurological: Negative.  Negative for dizziness, focal weakness, weakness and headaches.  Psychiatric/Behavioral: Negative.  The patient is not nervous/anxious.     As per HPI. Otherwise, a complete review of systems is negative.  PAST MEDICAL HISTORY: Past Medical History:  Diagnosis Date   Cancer (HCC)    basal cell skin cancer   Cellulitis of right leg 09/17/2013   COVID-19    Diabetes mellitus without complication (HCC)    DVT (deep venous thrombosis)  (HCC)    Dyspnea    due to PE   History of kidney stones    Morbid obesity (HCC)    Pulmonary embolism (HCC) 04/2020    PAST SURGICAL HISTORY: Past Surgical History:  Procedure Laterality Date   CLAVICLE HARDWARE REMOVAL Left 2007   CYSTOSCOPY/URETEROSCOPY/HOLMIUM LASER/STENT PLACEMENT Right 12/31/2020   Procedure: CYSTOSCOPY/URETEROSCOPY/HOLMIUM LASER/STENT PLACEMENT;  Surgeon: Rea Cambridge, MD;  Location: [facility] ORS;  Service: Urology;  Laterality: Right;   EXTRACORPOREAL SHOCK WAVE LITHOTRIPSY Right 05/16/2019   Procedure: EXTRACORPOREAL SHOCK WAVE LITHOTRIPSY (ESWL);  Surgeon: Rea Cambridge, MD;  Location: [facility];  Service: Urology;  Laterality: Right;   EXTRACORPOREAL SHOCK WAVE LITHOTRIPSY Right 07/18/2019   Procedure: EXTRACORPOREAL SHOCK WAVE LITHOTRIPSY (ESWL);  Surgeon: Rea Cambridge, MD;  Location: [facility] OR[facility];  Service: Urology;  Laterality: Right;   EXTRACORPOREAL SHOCK WAVE LITHOTRIPSY Left 10/24/2019   Procedure: EXTRACORPOREAL SHOCK WAVE LITHOTRIPSY (ESWL);  Surgeon: Rea Cambridge, MD;  Location: [facility] OR[facility];  Service: Urology;  Laterality: Left;   FRACTURE SURGERY Left 2007   "shattered clavicle; broke it in 9 places"   IR ANGIOGRAM PULMONARY BILATERAL SELECTIVE  01/29/2023   IR ANGIOGRAM SELECTIVE EACH ADDITIONAL VESSEL  01/29/2023   IR ANGIOGRAM SELECTIVE EACH ADDITIONAL VESSEL  01/29/2023   IR INFUSION THROMBOL ARTERIAL INITIAL (MS)  01/29/2023   IR INFUSION THROMBOL ARTERIAL INITIAL (MS)  01/29/2023   IR THROMB F/U EVAL ART/VEN FINAL DAY (MS)  01/30/2023   IR US  GUIDE VASC ACCESS RIGHT  01/29/2023    FAMILY HISTORY: Family History  Problem Relation Age of Onset   Leukemia Mother  Kidney disease Father    Heart attack Maternal Grandfather    Heart attack Paternal Grandfather    Stroke Other    Heart disease Other    Cancer Other     ADVANCED DIRECTIVES (Y/N):  N  HEALTH MAINTENANCE: Social History   Tobacco Use   Smoking status: Never   Smokeless  tobacco: Former    Types: Snuff  Vaping Use   Vaping status: Never Used  Substance Use Topics   Alcohol use: Not Currently   Drug use: No     Colonoscopy:  PAP:  Bone density:  Lipid panel:  Allergies  Allergen Reactions   Lisinopril     unknown   Amoxicillin Rash   Penicillins Other (See Comments)    Childhood reaction    Current Outpatient Medications  Medication Sig Dispense Refill   losartan (COZAAR) 25 MG tablet Take 12.5 mg by mouth at bedtime.     metFORMIN  (GLUCOPHAGE ) 500 MG tablet Take 1,000 mg by mouth 2 (two) times daily.     rivaroxaban  (XARELTO ) 20 MG TABS tablet Take 1 tablet (20 mg total) by mouth daily with supper. 90 tablet 1   RIVAROXABAN  (XARELTO ) VTE STARTER PACK (15 & 20 MG) Follow package directions: Take one 15mg  tablet by mouth twice a day. On day 22, switch to one 20mg  tablet once a day. Take with food. 51 each 0   rosuvastatin (CRESTOR) 5 MG tablet Take 5 mg by mouth at bedtime.     RYBELSUS  7 MG TABS Take 7 mg by mouth every morning.     No current facility-administered medications for this visit.    OBJECTIVE: Vitals:   02/20/23 1119  BP: 126/75  Pulse: 77  Resp: 19  Temp: (!) 97 F (36.1 C)  SpO2: 99%     Body mass index is 45.28 kg/m.    ECOG FS:0 - Asymptomatic  General: Well-developed, well-nourished, no acute distress. Eyes: Pink conjunctiva, anicteric sclera. HEENT: Normocephalic, moist mucous membranes. Lungs: No audible wheezing or coughing. Heart: Regular rate and rhythm. Abdomen: Soft, nontender, no obvious distention. Musculoskeletal: No edema, cyanosis, or clubbing. Neuro: Alert, answering all questions appropriately. Cranial nerves grossly intact. Skin: No rashes or petechiae noted. Psych: Normal affect. Lymphatics: No cervical, calvicular, axillary or inguinal LAD.   LAB RESULTS:  Lab Results  Component Value Date   NA 140 02/02/2023   K 3.8 02/02/2023   CL 107 02/02/2023   CO2 24 02/02/2023   GLUCOSE 105  (H) 02/02/2023   BUN 12 02/02/2023   CREATININE 0.96 02/02/2023   CALCIUM 8.7 (L) 02/02/2023   PROT 7.7 01/29/2023   ALBUMIN 3.7 01/29/2023   AST 53 (H) 01/29/2023   ALT 51 (H) 01/29/2023   ALKPHOS 75 01/29/2023   BILITOT 1.0 01/29/2023   GFRNONAA >60 02/02/2023   GFRAA >90 09/21/2013    Lab Results  Component Value Date   WBC 10.9 (H) 02/02/2023   NEUTROABS 18.0 (H) 01/29/2023   HGB 13.6 02/02/2023   HCT 40.4 02/02/2023   MCV 90.0 02/02/2023   PLT 161 02/02/2023     STUDIES: VAS US  LOWER EXTREMITY VENOUS (DVT) Result Date: 02/01/2023  Lower Venous DVT Study Patient Name:  Nicholas [name]  Date of Exam:   01/31/2023 Medical Rec #: [id number]         Accession #:    [id number] Date of Birth: [birth year]         Patient Gender: M Patient Age:   44 years Exam  Location:  [facility] Procedure:      VAS US  LOWER EXTREMITY VENOUS (DVT) Referring Phys: [facility] ALVA --------------------------------------------------------------------------------  Indications: Pulmonary embolism, and History of DVT in 2022.  Risk Factors: Confirmed PE. Comparison Study: 04/18/20 - right leg - acute DVT, left leg no evidence of DVT. Performing Technologist: Florencia Hunter RDMS, RVT  Examination Guidelines: A complete evaluation includes B-mode imaging, spectral Doppler, color Doppler, and power Doppler as needed of all accessible portions of each vessel. Bilateral testing is considered an integral part of a complete examination. Limited examinations for reoccurring indications may be performed as noted. The reflux portion of the exam is performed with the patient in reverse Trendelenburg.  +---------+---------------+---------+-----------+----------+-----------------+ RIGHT    CompressibilityPhasicitySpontaneityPropertiesThrombus Aging    +---------+---------------+---------+-----------+----------+-----------------+ CFV      Full           Yes      Yes                                     +---------+---------------+---------+-----------+----------+-----------------+ SFJ      Full                                                           +---------+---------------+---------+-----------+----------+-----------------+ FV Prox  Full                                                           +---------+---------------+---------+-----------+----------+-----------------+ FV Mid   Full                                                           +---------+---------------+---------+-----------+----------+-----------------+ FV DistalFull                                                           +---------+---------------+---------+-----------+----------+-----------------+ PFV      Full                                                           +---------+---------------+---------+-----------+----------+-----------------+ POP      None           No       No                   Acute             +---------+---------------+---------+-----------+----------+-----------------+ PTV      Partial  Age Indeterminate +---------+---------------+---------+-----------+----------+-----------------+ PERO     Partial                                      Age Indeterminate +---------+---------------+---------+-----------+----------+-----------------+   +---------+---------------+---------+-----------+----------+--------------+ LEFT     CompressibilityPhasicitySpontaneityPropertiesThrombus Aging +---------+---------------+---------+-----------+----------+--------------+ CFV      Full           Yes      Yes                                 +---------+---------------+---------+-----------+----------+--------------+ SFJ      Full                                                        +---------+---------------+---------+-----------+----------+--------------+ FV Prox  Full                                                         +---------+---------------+---------+-----------+----------+--------------+ FV Mid   Full                                                        +---------+---------------+---------+-----------+----------+--------------+ FV DistalFull                                                        +---------+---------------+---------+-----------+----------+--------------+ PFV      Full                                                        +---------+---------------+---------+-----------+----------+--------------+ POP      Full           Yes      Yes                                 +---------+---------------+---------+-----------+----------+--------------+ PTV      Full                                                        +---------+---------------+---------+-----------+----------+--------------+ PERO     Full                                                        +---------+---------------+---------+-----------+----------+--------------+  Summary: RIGHT: - Findings consistent with acute deep vein thrombosis involving the right popliteal vein.  - Findings consistent with age indeterminate deep vein thrombosis involving the right posterior tibial veins, and right peroneal veins.  - No cystic structure found in the popliteal fossa.  LEFT: - There is no evidence of deep vein thrombosis in the lower extremity.  - No cystic structure found in the popliteal fossa.  *See table(s) above for measurements and observations. Electronically signed by Genny Kid MD on 02/01/2023 at 6:57:25 PM.    Final    IR Angiogram Selective Each Additional Vessel Result Date: 01/31/2023 INDICATION: Syncope with submassive PE. RIGHT heart strain RV LV ratio 2.4. POSITIVE troponins. EXAM: Procedures: 1. PULMONARY ARTERIOGRAPHY 2. PLACEMENT OF BILATERAL PULMONARY ARTERIAL LYTIC INFUSION CATHETERS COMPARISON:  Chest XR and CTA PE, 01/29/2023. MEDICATIONS: None ANESTHESIA/SEDATION: Moderate (conscious)  sedation was employed during this procedure. A total of Versed  2 mg and Fentanyl  100 mcg was administered intravenously. Moderate Sedation Time: 55 minutes. [name] level of consciousness and vital signs were monitored continuously by radiology nursing throughout the procedure under my direct supervision. CONTRAST:  30mL OMNIPAQUE  IOHEXOL  300 MG/ML SOLN, 40mL OMNIPAQUE  IOHEXOL  300 MG/ML SOLN FLUOROSCOPY TIME:  Fluoroscopic dose; 423 mGy COMPLICATIONS: None immediate. TECHNIQUE: Informed written consent was obtained from the patient and/or patient's representative after a discussion of the risks, benefits and alternatives to treatment. Questions regarding the procedure were encouraged and answered. A timeout was performed prior to the initiation of the procedure. Ultrasound scanning was performed of the RIGHT groin and demonstrated patency of the common femoral vein. As such, the RIGHT common femoral vein was selected venous access. The RIGHT groin was prepped and draped in the usual sterile fashion, and a sterile drape was applied covering the operative field. Maximum barrier sterile technique with sterile gowns and gloves were used for the procedure. A timeout was performed prior to the initiation of the procedure. Local anesthesia was provided with 1% lidocaine . Under direct ultrasound guidance, the RIGHT common femoral vein was accessed with a micro puncture kit ultimately allowing placement of a 6 Fr 10 cm vascular sheath. Slightly cranial to this initial access, the RIGHT common femoral was again accessed with an additional micropuncture kit ultimately allowing placement of an additional 6 Fr 10 cm vascular sheath. Ultrasound images were saved for procedural documentation purposes. With the use of a Bentson wire, an angled pulmonary catheter was advanced into the main pulmonary artery and a limited central pulmonary arteriogram was performed. Pressure measurements were then obtained from the main  pulmonary artery. A 5 Fr angled pigtail catheter was advanced into the distal branch of the LEFT lower lobe pulmonary artery. Limited contrast injection confirmed appropriate positioning. Over an exchange length Donalee Fruits, the catheter was exchanged for a 90/10 cm multi side-hole infusion catheter. The procedure was then repeated on the patient's RIGHT pulmonary artery, and with the use of a stiff Rosen wire, access was obtained into a distal branch of the RIGHT lower lobe pulmonary artery allowing placement of a 90/10 cm multi side-hole infusion catheter. A postprocedural fluoroscopic image was obtained to document final catheter positioning. The external catheter tubing was secured at the right thigh and the lytic therapy was initiated. The patient tolerated the procedure well without immediate postprocedural complication. FINDINGS: Central pulmonary arteriogram demonstrates central pulmonary emboli at the BILATERAL pulmonary arterial bifurcations. Acquired pressure measurements: Main  pulmonary artery; 58/19, mean 35 (normal: < 25/10) Following the procedure, both infusion catheter tips terminate within  the distal aspects of the bilateral lower lobe sub segmental pulmonary arteries. IMPRESSION: Successful initiation of BILATERAL catheter-directed pulmonary arterial lysis for submassive pulmonary embolism and RIGHT heart strain. PLAN: Lytic catheter infusion rates as described per order set. Vascular Interventional Radiology will follow the patient with anticipated catheter removal during rounds tomorrow. Art Largo, MD [facility] Radiology Electronically Signed   By: Art Largo M.D.   On: 01/31/2023 08:47   IR INFUSION THROMBOL ARTERIAL INITIAL (MS) Result Date: 01/31/2023 INDICATION: Syncope with submassive PE. RIGHT heart strain RV LV ratio 2.4. POSITIVE troponins. EXAM: Procedures: 1. PULMONARY ARTERIOGRAPHY 2. PLACEMENT OF BILATERAL PULMONARY ARTERIAL LYTIC  INFUSION CATHETERS COMPARISON:  Chest XR and CTA PE, 01/29/2023. MEDICATIONS: None ANESTHESIA/SEDATION: Moderate (conscious) sedation was employed during this procedure. A total of Versed  2 mg and Fentanyl  100 mcg was administered intravenously. Moderate Sedation Time: 55 minutes. [name] level of consciousness and vital signs were monitored continuously by radiology nursing throughout the procedure under my direct supervision. CONTRAST:  30mL OMNIPAQUE  IOHEXOL  300 MG/ML SOLN, 40mL OMNIPAQUE  IOHEXOL  300 MG/ML SOLN FLUOROSCOPY TIME:  Fluoroscopic dose; 423 mGy COMPLICATIONS: None immediate. TECHNIQUE: Informed written consent was obtained from the patient and/or patient's representative after a discussion of the risks, benefits and alternatives to treatment. Questions regarding the procedure were encouraged and answered. A timeout was performed prior to the initiation of the procedure. Ultrasound scanning was performed of the RIGHT groin and demonstrated patency of the common femoral vein. As such, the RIGHT common femoral vein was selected venous access. The RIGHT groin was prepped and draped in the usual sterile fashion, and a sterile drape was applied covering the operative field. Maximum barrier sterile technique with sterile gowns and gloves were used for the procedure. A timeout was performed prior to the initiation of the procedure. Local anesthesia was provided with 1% lidocaine . Under direct ultrasound guidance, the RIGHT common femoral vein was accessed with a micro puncture kit ultimately allowing placement of a 6 Fr 10 cm vascular sheath. Slightly cranial to this initial access, the RIGHT common femoral was again accessed with an additional micropuncture kit ultimately allowing placement of an additional 6 Fr 10 cm vascular sheath. Ultrasound images were saved for procedural documentation purposes. With the use of a Bentson wire, an angled pulmonary catheter was advanced into the main pulmonary  artery and a limited central pulmonary arteriogram was performed. Pressure measurements were then obtained from the main pulmonary artery. A 5 Fr angled pigtail catheter was advanced into the distal branch of the LEFT lower lobe pulmonary artery. Limited contrast injection confirmed appropriate positioning. Over an exchange length Donalee Fruits, the catheter was exchanged for a 90/10 cm multi side-hole infusion catheter. The procedure was then repeated on the patient's RIGHT pulmonary artery, and with the use of a stiff Rosen wire, access was obtained into a distal branch of the RIGHT lower lobe pulmonary artery allowing placement of a 90/10 cm multi side-hole infusion catheter. A postprocedural fluoroscopic image was obtained to document final catheter positioning. The external catheter tubing was secured at the right thigh and the lytic therapy was initiated. The patient tolerated the procedure well without immediate postprocedural complication. FINDINGS: Central pulmonary arteriogram demonstrates central pulmonary emboli at the BILATERAL pulmonary arterial bifurcations. Acquired pressure measurements: Main  pulmonary artery; 58/19, mean 35 (normal: < 25/10) Following the procedure, both infusion catheter tips terminate within the distal aspects of the bilateral lower lobe sub segmental pulmonary arteries. IMPRESSION: Successful initiation of BILATERAL  catheter-directed pulmonary arterial lysis for submassive pulmonary embolism and RIGHT heart strain. PLAN: Lytic catheter infusion rates as described per order set. Vascular Interventional Radiology will follow the patient with anticipated catheter removal during rounds tomorrow. Art Largo, MD [facility] Electronically Signed   By: Art Largo M.D.   On: 01/31/2023 08:47   IR Angiogram Selective Each Additional Vessel Result Date: 01/31/2023 INDICATION: Syncope with submassive PE. RIGHT heart strain RV LV  ratio 2.4. POSITIVE troponins. EXAM: Procedures: 1. PULMONARY ARTERIOGRAPHY 2. PLACEMENT OF BILATERAL PULMONARY ARTERIAL LYTIC INFUSION CATHETERS COMPARISON:  Chest XR and CTA PE, 01/29/2023. MEDICATIONS: None ANESTHESIA/SEDATION: Moderate (conscious) sedation was employed during this procedure. A total of Versed  2 mg and Fentanyl  100 mcg was administered intravenously. Moderate Sedation Time: 55 minutes. [name] level of consciousness and vital signs were monitored continuously by radiology nursing throughout the procedure under my direct supervision. CONTRAST:  30mL OMNIPAQUE  IOHEXOL  300 MG/ML SOLN, 40mL OMNIPAQUE  IOHEXOL  300 MG/ML SOLN FLUOROSCOPY TIME:  Fluoroscopic dose; 423 mGy COMPLICATIONS: None immediate. TECHNIQUE: Informed written consent was obtained from the patient and/or patient's representative after a discussion of the risks, benefits and alternatives to treatment. Questions regarding the procedure were encouraged and answered. A timeout was performed prior to the initiation of the procedure. Ultrasound scanning was performed of the RIGHT groin and demonstrated patency of the common femoral vein. As such, the RIGHT common femoral vein was selected venous access. The RIGHT groin was prepped and draped in the usual sterile fashion, and a sterile drape was applied covering the operative field. Maximum barrier sterile technique with sterile gowns and gloves were used for the procedure. A timeout was performed prior to the initiation of the procedure. Local anesthesia was provided with 1% lidocaine . Under direct ultrasound guidance, the RIGHT common femoral vein was accessed with a micro puncture kit ultimately allowing placement of a 6 Fr 10 cm vascular sheath. Slightly cranial to this initial access, the RIGHT common femoral was again accessed with an additional micropuncture kit ultimately allowing placement of an additional 6 Fr 10 cm vascular sheath. Ultrasound images were saved for procedural  documentation purposes. With the use of a Bentson wire, an angled pulmonary catheter was advanced into the main pulmonary artery and a limited central pulmonary arteriogram was performed. Pressure measurements were then obtained from the main pulmonary artery. A 5 Fr angled pigtail catheter was advanced into the distal branch of the LEFT lower lobe pulmonary artery. Limited contrast injection confirmed appropriate positioning. Over an exchange length Donalee Fruits, the catheter was exchanged for a 90/10 cm multi side-hole infusion catheter. The procedure was then repeated on the patient's RIGHT pulmonary artery, and with the use of a stiff Rosen wire, access was obtained into a distal branch of the RIGHT lower lobe pulmonary artery allowing placement of a 90/10 cm multi side-hole infusion catheter. A postprocedural fluoroscopic image was obtained to document final catheter positioning. The external catheter tubing was secured at the right thigh and the lytic therapy was initiated. The patient tolerated the procedure well without immediate postprocedural complication. FINDINGS: Central pulmonary arteriogram demonstrates central pulmonary emboli at the BILATERAL pulmonary arterial bifurcations. Acquired pressure measurements: Main  pulmonary artery; 58/19, mean 35 (normal: < 25/10) Following the procedure, both infusion catheter tips terminate within the distal aspects of the bilateral lower lobe sub segmental pulmonary arteries. IMPRESSION: Successful initiation of BILATERAL catheter-directed pulmonary arterial lysis for submassive pulmonary embolism and RIGHT heart strain. PLAN: Lytic catheter infusion rates  as described per order set. Vascular Interventional Radiology will follow the patient with anticipated catheter removal during rounds tomorrow. Art Largo, MD [facility] Radiology Electronically Signed   By: Art Largo M.D.   On: 01/31/2023 08:46   ECHOCARDIOGRAM  COMPLETE Result Date: 01/30/2023    ECHOCARDIOGRAM REPORT   Patient Name:   Nicholas [name] Date of Exam: 01/30/2023 Medical Rec #:  [id number]        Height:       73.0 in Accession #:    [id number]       Weight:       329.6 lb Date of Birth:  09/27/1970        BSA:          2.661 m Patient Age:    52 years         BP:           100/69 mmHg Patient Gender: M                HR:           82 bpm. Exam Location:  Inpatient Procedure: 2D Echo, Color Doppler, Cardiac Doppler and Intracardiac            Opacification Agent Indications:    Pulmonary Thromboembolism  History:        Patient has prior history of Echocardiogram examinations, most                 recent 06/21/2021. Pulmonary Thromboembolism,                 Signs/Symptoms:Dyspnea; Risk Factors:Diabetes.  Sonographer:    Travis Friedman RDCS Referring Phys: [phone] WHITNEY D HARRIS IMPRESSIONS  1. Left ventricular ejection fraction, by estimation, is 65 to 70%. The left ventricle has normal function. The left ventricle has no regional wall motion abnormalities. Left ventricular diastolic parameters are indeterminate.  2. Right ventricular systolic function is mildly reduced. The right ventricular size is mildly enlarged. Tricuspid regurgitation signal is inadequate for assessing PA pressure.  3. The mitral valve is grossly normal. No evidence of mitral valve regurgitation. No evidence of mitral stenosis.  4. The aortic valve is grossly normal. Aortic valve regurgitation is not visualized. No aortic stenosis is present.  5. The inferior vena cava is dilated in size with >50% respiratory variability, suggesting right atrial pressure of 8 mmHg. FINDINGS  Left Ventricle: Left ventricular ejection fraction, by estimation, is 65 to 70%. The left ventricle has normal function. The left ventricle has no regional wall motion abnormalities. The left ventricular internal cavity size was normal in size. There is  no left ventricular hypertrophy. Left ventricular diastolic  parameters are indeterminate. Right Ventricle: The right ventricular size is mildly enlarged. No increase in right ventricular wall thickness. Right ventricular systolic function is mildly reduced. Tricuspid regurgitation signal is inadequate for assessing PA pressure. Left Atrium: Left atrial size was normal in size. Right Atrium: Right atrial size was normal in size. Pericardium: There is no evidence of pericardial effusion. Presence of epicardial fat layer. Mitral Valve: The mitral valve is grossly normal. No evidence of mitral valve regurgitation. No evidence of mitral valve stenosis. Tricuspid Valve: The tricuspid valve is normal in structure. Tricuspid valve regurgitation is trivial. No evidence of tricuspid stenosis. Aortic Valve: The aortic valve is grossly normal. Aortic valve regurgitation is not visualized. No aortic stenosis is present. Aortic valve mean gradient measures 2.0 mmHg. Aortic valve peak gradient measures  3.8 mmHg. Aortic valve area, by VTI measures 4.16 cm. Pulmonic Valve: The pulmonic valve was normal in structure. Pulmonic valve regurgitation is trivial. No evidence of pulmonic stenosis. Aorta: The aortic root is normal in size and structure. Venous: The inferior vena cava is dilated in size with greater than 50% respiratory variability, suggesting right atrial pressure of 8 mmHg. IAS/Shunts: No atrial level shunt detected by color flow Doppler.  LEFT VENTRICLE PLAX 2D LVIDd:         4.50 cm      Diastology LVIDs:         3.50 cm      LV e' medial:    5.00 cm/s LV PW:         1.30 cm      LV E/e' medial:  12.9 LV IVS:        0.90 cm      LV e' lateral:   6.96 cm/s LVOT diam:     2.50 cm      LV E/e' lateral: 9.3 LV SV:         69 LV SV Index:   26 LVOT Area:     4.91 cm  LV Volumes (MOD) LV vol d, MOD A2C: 88.4 ml LV vol d, MOD A4C: 129.0 ml LV vol s, MOD A2C: 39.6 ml LV vol s, MOD A4C: 53.1 ml LV SV MOD A2C:     48.8 ml LV SV MOD A4C:     129.0 ml LV SV MOD BP:      61.9 ml RIGHT  VENTRICLE             IVC RV Basal diam:  3.30 cm     IVC diam: 2.00 cm RV Mid diam:    2.70 cm RV S prime:     13.20 cm/s TAPSE (M-mode): 1.6 cm LEFT ATRIUM           Index        RIGHT ATRIUM           Index LA Vol (A2C): 49.8 ml 18.72 ml/m  RA Area:     10.50 cm                                    RA Volume:   17.90 ml  6.73 ml/m  AORTIC VALVE AV Area (Vmax):    4.17 cm AV Area (Vmean):   4.11 cm AV Area (VTI):     4.16 cm AV Vmax:           98.00 cm/s AV Vmean:          65.350 cm/s AV VTI:            0.166 m AV Peak Grad:      3.8 mmHg AV Mean Grad:      2.0 mmHg LVOT Vmax:         83.30 cm/s LVOT Vmean:        54.700 cm/s LVOT VTI:          0.141 m LVOT/AV VTI ratio: 0.85  AORTA Ao Root diam: 3.50 cm Ao Asc diam:  2.60 cm MITRAL VALVE MV Area (PHT): 5.31 cm    SHUNTS MV Decel Time: 143 msec    Systemic VTI:  0.14 m MV E velocity: 64.50 cm/s  Systemic Diam: 2.50 cm MV A velocity: 50.80 cm/s MV E/A ratio:  1.27 Grady Lawman MD  Electronically signed by Grady Lawman MD Signature Date/Time: 01/30/2023/9:38:25 PM    Final    CT RENAL STONE STUDY Result Date: 01/30/2023 CLINICAL DATA:  Abdominal and flank pain.  Nephrolithiasis. EXAM: CT ABDOMEN AND PELVIS WITHOUT CONTRAST TECHNIQUE: Multidetector CT imaging of the abdomen and pelvis was performed following the standard protocol without IV contrast. RADIATION DOSE REDUCTION: This exam was performed according to the departmental dose-optimization program which includes automated exposure control, adjustment of the mA and/or kV according to patient size and/or use of iterative reconstruction technique. COMPARISON:  Chest CTA on 01/29/2023, and AP CT on 12/21/2020 FINDINGS: Lower chest: New sub-solid opacity in the posterior right lower lobe which measures 4.9 x 3.0 cm, consistent with pulmonary infarct given pulmonary emboli shown on chest CT performed yesterday. Hepatobiliary: No mass visualized on this unenhanced exam. Gallbladder contains contrast  material attributed to IV contrast administered yesterday, but is otherwise unremarkable. Pancreas: No mass or inflammatory process visualized on this unenhanced exam. Spleen:  Within normal limits in size. Adrenals/Urinary tract: Bilateral renal calculi are again seen, largest measuring 14 mm in lower pole of left kidney. Mild-to-moderate left hydronephrosis is seen due to a 8 mm calculus at the left UPJ. Normal appearance of bladder. Stomach/Bowel: No evidence of obstruction, inflammatory process, or abnormal fluid collections. Normal appendix visualized. Diverticulosis is seen mainly involving the sigmoid colon, however there is no evidence of diverticulitis. Vascular/Lymphatic: No pathologically enlarged lymph nodes identified. No evidence of abdominal aortic aneurysm. Reproductive:  No mass or other significant abnormality. Other:  Mild hemorrhage seen in right groin soft tissues. Musculoskeletal:  No suspicious bone lesions identified. IMPRESSION: Mild-to-moderate left hydronephrosis due to 8 mm calculus at the left UPJ. Bilateral nephrolithiasis. Colonic diverticulosis, without radiographic evidence of diverticulitis. Mild hemorrhage in right groin soft tissues. New 4.9 cm sub-solid opacity in posterior right lower lobe, consistent with pulmonary infarct given acute pulmonary embolism shown on chest CTA performed yesterday. Electronically Signed   By: Marlyce Sine M.D.   On: 01/30/2023 12:14   IR THROMB F/U EVAL ART/VEN FINAL DAY (MS) Result Date: 01/30/2023 INDICATION: History of sub massive pulmonary embolism, post initiation of bilateral pulmonary arterial thrombolysis on 01/29/2023. Patient has completed prescribed course of bilateral pulmonary arterial lytic infusion and presents today for repeat pulmonary arterial pressure measurements prior to infusion catheter removal. EXAM: PULMONARY ARTERIAL PRESSURE MEASUREMENT AND INFUSION CATHETER(S) REMOVAL COMPARISON:  IR fluoroscopy, 01/29/2023. Chest XR  earlier same day. CTA PE, 01/29/2023 MEDICATIONS: None CONTRAST:  None FLUOROSCOPY TIME:  None COMPLICATIONS: None immediate. TECHNIQUE: The patient was positioned supine on there hospital bed. Review of this morning's chest radiograph demonstrates unchanged positioning of bilateral pulmonary arterial infusion catheters with tip of the right-sided pulmonary arterial infusion catheter approximately 5-10 cm peripheral to the expected outflow of the main pulmonary artery. As such, the right pulmonary arterial catheter's infusion wire was removed and the multi side-hole infusion catheter was retracted to the estimated location of the main pulmonary artery. Pressure measurements were acquired from this location. At this point, the procedure was terminated. All wires, catheters and sheaths were removed from the patient. Hemostasis was achieved at the right groin access site with manual compression. A dressing was placed. The patient tolerated the procedure well without immediate postprocedural complication. FINDINGS: Acquired pressure measurements as follows: Preprocedural main pulmonary artery-58/19; mean-35 (normal: < 25/10) Postprocedural main pulmonary artery-31/14; mean-20 IMPRESSION: Successful BILATERAL catheter directed pulmonary arterial thrombolysis with reduction (15 mmHg) in pressure within the main pulmonary artery. Art Largo,  MD [facility] Radiology Electronically Signed   By: Art Largo M.D.   On: 01/30/2023 09:29   IR INFUSION THROMBOL ARTERIAL INITIAL (MS) Result Date: 01/30/2023 INDICATION: Syncope with submassive PE. RIGHT heart strain RV LV ratio 2.4. POSITIVE troponins. EXAM: Procedures: 1. PULMONARY ARTERIOGRAPHY 2. PLACEMENT OF BILATERAL PULMONARY ARTERIAL LYTIC INFUSION CATHETERS COMPARISON:  Chest XR and CTA PE, 01/29/2023. MEDICATIONS: None ANESTHESIA/SEDATION: Moderate (conscious) sedation was employed during this procedure. A total of  Versed  2 mg and Fentanyl  100 mcg was administered intravenously. Moderate Sedation Time: 55 minutes. [name] level of consciousness and vital signs were monitored continuously by radiology nursing throughout the procedure under my direct supervision. CONTRAST:  30mL OMNIPAQUE  IOHEXOL  300 MG/ML SOLN, 40mL OMNIPAQUE  IOHEXOL  300 MG/ML SOLN FLUOROSCOPY TIME:  Fluoroscopic dose; 423 mGy COMPLICATIONS: None immediate. TECHNIQUE: Informed written consent was obtained from the patient and/or patient's representative after a discussion of the risks, benefits and alternatives to treatment. Questions regarding the procedure were encouraged and answered. A timeout was performed prior to the initiation of the procedure. Ultrasound scanning was performed of the RIGHT groin and demonstrated patency of the common femoral vein. As such, the RIGHT common femoral vein was selected venous access. The RIGHT groin was prepped and draped in the usual sterile fashion, and a sterile drape was applied covering the operative field. Maximum barrier sterile technique with sterile gowns and gloves were used for the procedure. A timeout was performed prior to the initiation of the procedure. Local anesthesia was provided with 1% lidocaine . Under direct ultrasound guidance, the RIGHT common femoral vein was accessed with a micro puncture kit ultimately allowing placement of a 6 Fr 10 cm vascular sheath. Slightly cranial to this initial access, the RIGHT common femoral was again accessed with an additional micropuncture kit ultimately allowing placement of an additional 6 Fr 10 cm vascular sheath. Ultrasound images were saved for procedural documentation purposes. With the use of a Bentson wire, an angled pulmonary catheter was advanced into the main pulmonary artery and a limited central pulmonary arteriogram was performed. Pressure measurements were then obtained from the main pulmonary artery. A 5 Fr angled pigtail catheter was advanced  into the distal branch of the LEFT lower lobe pulmonary artery. Limited contrast injection confirmed appropriate positioning. Over an exchange length Donalee Fruits, the catheter was exchanged for a 90/10 cm multi side-hole infusion catheter. The procedure was then repeated on the patient's RIGHT pulmonary artery, and with the use of a stiff Rosen wire, access was obtained into a distal branch of the RIGHT lower lobe pulmonary artery allowing placement of a 90/10 cm multi side-hole infusion catheter. A postprocedural fluoroscopic image was obtained to document final catheter positioning. The external catheter tubing was secured at the right thigh and the lytic therapy was initiated. The patient tolerated the procedure well without immediate postprocedural complication. FINDINGS: Central pulmonary arteriogram demonstrates central pulmonary emboli at the BILATERAL pulmonary arterial bifurcations. Acquired pressure measurements: Main  pulmonary artery; 58/19, mean 35 (normal: < 25/10) Following the procedure, both infusion catheter tips terminate within the distal aspects of the bilateral lower lobe sub segmental pulmonary arteries. IMPRESSION: Successful initiation of BILATERAL catheter-directed pulmonary arterial lysis for submassive pulmonary embolism and RIGHT heart strain. PLAN: Lytic catheter infusion rates as described per order set. Vascular Interventional Radiology will follow the patient with anticipated catheter removal during rounds tomorrow. Art Largo, MD [facility] Radiology Electronically Signed   By: Mitch Amsler.D.  On: 01/30/2023 09:23   IR US  Guide Vasc Access Right Result Date: 01/30/2023 INDICATION: Syncope with submassive PE. RIGHT heart strain RV LV ratio 2.4. POSITIVE troponins. EXAM: Procedures: 1. PULMONARY ARTERIOGRAPHY 2. PLACEMENT OF BILATERAL PULMONARY ARTERIAL LYTIC INFUSION CATHETERS COMPARISON:  Chest XR and CTA PE, 01/29/2023.  MEDICATIONS: None ANESTHESIA/SEDATION: Moderate (conscious) sedation was employed during this procedure. A total of Versed  2 mg and Fentanyl  100 mcg was administered intravenously. Moderate Sedation Time: 55 minutes. [name] level of consciousness and vital signs were monitored continuously by radiology nursing throughout the procedure under my direct supervision. CONTRAST:  30mL OMNIPAQUE  IOHEXOL  300 MG/ML SOLN, 40mL OMNIPAQUE  IOHEXOL  300 MG/ML SOLN FLUOROSCOPY TIME:  Fluoroscopic dose; 423 mGy COMPLICATIONS: None immediate. TECHNIQUE: Informed written consent was obtained from the patient and/or patient's representative after a discussion of the risks, benefits and alternatives to treatment. Questions regarding the procedure were encouraged and answered. A timeout was performed prior to the initiation of the procedure. Ultrasound scanning was performed of the RIGHT groin and demonstrated patency of the common femoral vein. As such, the RIGHT common femoral vein was selected venous access. The RIGHT groin was prepped and draped in the usual sterile fashion, and a sterile drape was applied covering the operative field. Maximum barrier sterile technique with sterile gowns and gloves were used for the procedure. A timeout was performed prior to the initiation of the procedure. Local anesthesia was provided with 1% lidocaine . Under direct ultrasound guidance, the RIGHT common femoral vein was accessed with a micro puncture kit ultimately allowing placement of a 6 Fr 10 cm vascular sheath. Slightly cranial to this initial access, the RIGHT common femoral was again accessed with an additional micropuncture kit ultimately allowing placement of an additional 6 Fr 10 cm vascular sheath. Ultrasound images were saved for procedural documentation purposes. With the use of a Bentson wire, an angled pulmonary catheter was advanced into the main pulmonary artery and a limited central pulmonary arteriogram was performed.  Pressure measurements were then obtained from the main pulmonary artery. A 5 Fr angled pigtail catheter was advanced into the distal branch of the LEFT lower lobe pulmonary artery. Limited contrast injection confirmed appropriate positioning. Over an exchange length Donalee Fruits, the catheter was exchanged for a 90/10 cm multi side-hole infusion catheter. The procedure was then repeated on the patient's RIGHT pulmonary artery, and with the use of a stiff Rosen wire, access was obtained into a distal branch of the RIGHT lower lobe pulmonary artery allowing placement of a 90/10 cm multi side-hole infusion catheter. A postprocedural fluoroscopic image was obtained to document final catheter positioning. The external catheter tubing was secured at the right thigh and the lytic therapy was initiated. The patient tolerated the procedure well without immediate postprocedural complication. FINDINGS: Central pulmonary arteriogram demonstrates central pulmonary emboli at the BILATERAL pulmonary arterial bifurcations. Acquired pressure measurements: Main  pulmonary artery; 58/19, mean 35 (normal: < 25/10) Following the procedure, both infusion catheter tips terminate within the distal aspects of the bilateral lower lobe sub segmental pulmonary arteries. IMPRESSION: Successful initiation of BILATERAL catheter-directed pulmonary arterial lysis for submassive pulmonary embolism and RIGHT heart strain. PLAN: Lytic catheter infusion rates as described per order set. Vascular Interventional Radiology will follow the patient with anticipated catheter removal during rounds tomorrow. Art Largo, MD [facility] Radiology Electronically Signed   By: Art Largo M.D.   On: 01/30/2023 09:23   IR Angiogram Pulmonary Bilateral Selective Result Date: 01/30/2023 INDICATION: Syncope with submassive  PE. RIGHT heart strain RV LV ratio 2.4. POSITIVE troponins. EXAM: Procedures: 1. PULMONARY  ARTERIOGRAPHY 2. PLACEMENT OF BILATERAL PULMONARY ARTERIAL LYTIC INFUSION CATHETERS COMPARISON:  Chest XR and CTA PE, 01/29/2023. MEDICATIONS: None ANESTHESIA/SEDATION: Moderate (conscious) sedation was employed during this procedure. A total of Versed  2 mg and Fentanyl  100 mcg was administered intravenously. Moderate Sedation Time: 55 minutes. [name] level of consciousness and vital signs were monitored continuously by radiology nursing throughout the procedure under my direct supervision. CONTRAST:  30mL OMNIPAQUE  IOHEXOL  300 MG/ML SOLN, 40mL OMNIPAQUE  IOHEXOL  300 MG/ML SOLN FLUOROSCOPY TIME:  Fluoroscopic dose; 423 mGy COMPLICATIONS: None immediate. TECHNIQUE: Informed written consent was obtained from the patient and/or patient's representative after a discussion of the risks, benefits and alternatives to treatment. Questions regarding the procedure were encouraged and answered. A timeout was performed prior to the initiation of the procedure. Ultrasound scanning was performed of the RIGHT groin and demonstrated patency of the common femoral vein. As such, the RIGHT common femoral vein was selected venous access. The RIGHT groin was prepped and draped in the usual sterile fashion, and a sterile drape was applied covering the operative field. Maximum barrier sterile technique with sterile gowns and gloves were used for the procedure. A timeout was performed prior to the initiation of the procedure. Local anesthesia was provided with 1% lidocaine . Under direct ultrasound guidance, the RIGHT common femoral vein was accessed with a micro puncture kit ultimately allowing placement of a 6 Fr 10 cm vascular sheath. Slightly cranial to this initial access, the RIGHT common femoral was again accessed with an additional micropuncture kit ultimately allowing placement of an additional 6 Fr 10 cm vascular sheath. Ultrasound images were saved for procedural documentation purposes. With the use of a Bentson wire, an  angled pulmonary catheter was advanced into the main pulmonary artery and a limited central pulmonary arteriogram was performed. Pressure measurements were then obtained from the main pulmonary artery. A 5 Fr angled pigtail catheter was advanced into the distal branch of the LEFT lower lobe pulmonary artery. Limited contrast injection confirmed appropriate positioning. Over an exchange length Donalee Fruits, the catheter was exchanged for a 90/10 cm multi side-hole infusion catheter. The procedure was then repeated on the patient's RIGHT pulmonary artery, and with the use of a stiff Rosen wire, access was obtained into a distal branch of the RIGHT lower lobe pulmonary artery allowing placement of a 90/10 cm multi side-hole infusion catheter. A postprocedural fluoroscopic image was obtained to document final catheter positioning. The external catheter tubing was secured at the right thigh and the lytic therapy was initiated. The patient tolerated the procedure well without immediate postprocedural complication. FINDINGS: Central pulmonary arteriogram demonstrates central pulmonary emboli at the BILATERAL pulmonary arterial bifurcations. Acquired pressure measurements: Main  pulmonary artery; 58/19, mean 35 (normal: < 25/10) Following the procedure, both infusion catheter tips terminate within the distal aspects of the bilateral lower lobe sub segmental pulmonary arteries. IMPRESSION: Successful initiation of BILATERAL catheter-directed pulmonary arterial lysis for submassive pulmonary embolism and RIGHT heart strain. PLAN: Lytic catheter infusion rates as described per order set. Vascular Interventional Radiology will follow the patient with anticipated catheter removal during rounds tomorrow. Art Largo, MD [facility] Radiology Electronically Signed   By: Art Largo M.D.   On: 01/30/2023 09:23   DG Chest Port 1 View Result Date: 01/30/2023 CLINICAL DATA:  Submassive PE.  Thrombolysis. [id number] Post-operative state [id number] EXAM: PORTABLE CHEST 1 VIEW COMPARISON:  Chest XR and CTA  chest, 01/29/2023. IR fluoroscopy, 01/29/2023. FINDINGS: Support lines; stable positioning of BILATERAL pulmonary arterial infusion catheters. Cardiomediastinal silhouette is within normal limits. Lungs are well inflated. No focal consolidation or mass. No pleural effusion or pneumothorax. No acute displaced fracture. IMPRESSION: 1. Stable positioning of BILATERAL pulmonary arterial infusion catheters. 2. No acute superimposed cardiopulmonary process. Electronically Signed   By: Art Largo M.D.   On: 01/30/2023 08:22   DG Abd 1 View Result Date: 01/30/2023 CLINICAL DATA:  Acute abdominal and back pain EXAM: ABDOMEN - 1 VIEW COMPARISON:  Abdominal CT 12/21/2020 FINDINGS: Pulmonary artery catheters over the abdomen are unremarkable. Renal calculi, numerous on the right. Bowel gas pattern is normal. IMPRESSION: Normal bowel gas pattern. Unremarkable intra-abdominal position of pulmonary artery catheters. Electronically Signed   By: Ronnette Coke M.D.   On: 01/30/2023 06:49   CT Angio Chest PE W and/or Wo Contrast Result Date: 01/29/2023 CLINICAL DATA:  Short of breath.  Elevated D-dimer. EXAM: CT ANGIOGRAPHY CHEST WITH CONTRAST TECHNIQUE: Multidetector CT imaging of the chest was performed using the standard protocol during bolus administration of intravenous contrast. Multiplanar CT image reconstructions and MIPs were obtained to evaluate the vascular anatomy. RADIATION DOSE REDUCTION: This exam was performed according to the departmental dose-optimization program which includes automated exposure control, adjustment of the mA and/or kV according to patient size and/or use of iterative reconstruction technique. CONTRAST:  75mL OMNIPAQUE  IOHEXOL  350 MG/ML SOLN COMPARISON:  01/02/2022. FINDINGS: Cardiovascular: There are multiple bilateral pulmonary emboli. Emboli noted in the main pulmonary arteries  extending into all segmental branches, slightly higher burden on the right than the left. A thin saddle embolus crosses from the left to right pulmonary arteries. RV LV ratio is 2.4, significantly elevated consistent with right heart strain. Heart is normal in overall size. No pericardial effusion. Aorta is normal in caliber. No dissection or atherosclerosis. Mediastinum/Nodes: No neck base, mediastinal or hilar masses. No enlarged lymph nodes. Trachea esophagus are unremarkable. Lungs/Pleura: Mild linear opacities, mostly in the lower lungs, consistent with atelectasis. No lung consolidation to suggest infarction. No pulmonary edema. No pleural effusion or pneumothorax. Upper Abdomen: No acute abnormality. Musculoskeletal: No acute fracture. No bone lesion. No chest wall mass. Review of the MIP images confirms the above findings. IMPRESSION: 1. Positive for acute bilateral pulmonary emboli. Multiple emboli in the main pulmonary arteries extending into all segmental branches. Thin saddle embolus. 2. Positive for right heart strain (RV/LV Ratio = 2.4 ) consistent with at least submassive (intermediate risk) PE. The presence of right heart strain has been associated with an increased risk of morbidity and mortality. Please refer to the "Code PE Focused" order set in EPIC. Critical Value/emergent results were called by telephone at the time of interpretation on 01/29/2023 at 5:09 pm to provider [facility] , who verbally acknowledged these results. Electronically Signed   By: Amanda Jungling M.D.   On: 01/29/2023 17:09   DG Chest 2 View Result Date: 01/29/2023 CLINICAL DATA:  Chest pain. EXAM: CHEST - 2 VIEW COMPARISON:  04/07/2022 FINDINGS: Lungs are hypoinflated without focal airspace consolidation or effusion. Cardiomediastinal silhouette and remainder the exam is unchanged. IMPRESSION: Hypoinflation without acute cardiopulmonary disease. Electronically Signed   By: Roda Cirri M.D.   On: 01/29/2023 15:18     ASSESSMENT:  Saddle pulmonary embolism with heart strain.  PLAN:    Saddle pulmonary embolism with heart strain: Patient's most recent pulmonary embolism was significant in size causing right heart strain requiring thrombectomy.  This was his  second pulmonary embolism and have recommended the patient stay on Xarelto  lifelong.  Hypercoagulable workup completed in 2023 was negative, have added prothrombin gene mutation and Antithrombin III  today to complete the workup, although results will not change recommendations.  Patient was instructed to complete his Xarelto  starter pack as prescribed.  He was given a prescription today for Xarelto  20mg  daily once the starter pack has completed.  All further refills can be managed by primary care.  No follow-up has been scheduled. Kidney stone: Patient reports he might have to have surgery in the near future.  At minimum would recommend waiting on till patient finishes his starter pack.  Preferably, would waiting 2 to 3 months if possible.  Either way, patient will require a standard Lovenox  bridge surrounding any surgery including renal stone extraction.  Hold Xarelto  starting 2 days prior to procedure and initiate Lovenox  1 mg/kg every 12 hours.  Hold Lovenox  dosing the morning of procedure, but then reinitiate that evening.  Patient should start his Xarelto  back 1 day after procedure and then discontinue Lovenox  2 days after procedure.  I spent a total of 45 minutes reviewing chart data, face-to-face evaluation with the patient, counseling and coordination of care as detailed above.   Patient expressed understanding and was in agreement with this plan. He also understands that He can call clinic at any time with any questions, concerns, or complaints.    Shellie Dials, MD   02/20/2023 12:25 PM

## 2023-02-21 ENCOUNTER — Telehealth: Payer: Self-pay | Admitting: Oncology

## 2023-02-21 NOTE — Telephone Encounter (Signed)
Nicholas [name] at [facility] has called requesting surgical clearance/medication plan discussed during consult with Dr. Orlie Dakin on 2/10 be faxed to her at [phone].

## 2023-02-23 ENCOUNTER — Other Ambulatory Visit: Payer: Self-pay | Admitting: Urology

## 2023-02-23 ENCOUNTER — Telehealth: Payer: Self-pay | Admitting: *Deleted

## 2023-02-23 NOTE — Telephone Encounter (Signed)
[facility] called about the schedule of kidney stones on February 26.  The office sent a paper needing information about stopping Xarelto and then bridging what ever the doctor says.  The [place] team will need to tell the patient when he is stopping the Xarelto and if he needs any bridging with Lovenox.  They are asking to call with the information but also tell the patient and himself how it is going to go with Xarelto and bridging.

## 2023-02-24 ENCOUNTER — Other Ambulatory Visit: Payer: Self-pay | Admitting: Urology

## 2023-02-27 LAB — PROTHROMBIN GENE MUTATION

## 2023-02-27 NOTE — Telephone Encounter (Signed)
Nicholas [name] from [facility] is on the phone checking on surgical clearance. Nicholas [name] phone number is [phone].

## 2023-02-27 NOTE — Telephone Encounter (Signed)
Called [facility] and was placed on a long hold  I have received another clearance request- this one says procedure is not scheduled for 03/08/23  I called the pt and let him know he needs appt to be cleared  Appt scheduled for Thurs 2/20 at 9 am with Florentina Addison  Will hold in clearance pool to f/u on

## 2023-02-27 NOTE — Patient Instructions (Signed)
DUE TO COVID-19 ONLY TWO VISITORS  (aged 51 and older)  ARE ALLOWED TO COME WITH YOU AND STAY IN THE WAITING ROOM ONLY DURING PRE OP AND PROCEDURE.   **NO VISITORS ARE ALLOWED IN THE SHORT STAY AREA OR RECOVERY ROOM!!**  IF YOU WILL BE ADMITTED INTO THE HOSPITAL YOU ARE ALLOWED ONLY FOUR SUPPORT PEOPLE DURING VISITATION HOURS ONLY (7 AM -8PM)   The support person(s) must pass our screening, gel in and out, and wear a mask at all times, including in the patient's room. Patients must also wear a mask when staff or their support person are in the room. Visitors GUEST BADGE MUST BE WORN VISIBLY  One adult visitor may remain with you overnight and MUST be in the room by 8 P.M.     Your procedure is scheduled on: 03/08/23   Report to [facility] Main Entrance    Report to admitting at : 9:30 AM   Call this number if you have problems the morning of surgery [phone]   Do not eat food or drink: After Midnight.  FOLLOW AND ANY ADDITIONAL PRE OP INSTRUCTIONS YOU RECEIVED FROM YOUR SURGEON'S OFFICE!!!   Oral Hygiene is also important to reduce your risk of infection.                                    Remember - BRUSH YOUR TEETH THE MORNING OF SURGERY WITH YOUR REGULAR TOOTHPASTE  DENTURES WILL BE REMOVED PRIOR TO SURGERY PLEASE DO NOT APPLY "Poly grip" OR ADHESIVES!!!   Do NOT smoke after Midnight   Take these medicines the morning of surgery with A SIP OF WATER: NONE  How to Manage Your Diabetes Before and After Surgery  Why is it important to control my blood sugar before and after surgery? Improving blood sugar levels before and after surgery helps healing and can limit problems. A way of improving blood sugar control is eating a healthy diet by:  Eating less sugar and carbohydrates  Increasing activity/exercise  Talking with your doctor about reaching your blood sugar goals High blood sugars (greater than 180 mg/dL) can raise your risk of infections and slow your  recovery, so you will need to focus on controlling your diabetes during the weeks before surgery. Make sure that the doctor who takes care of your diabetes knows about your planned surgery including the date and location.  How do I manage my blood sugar before surgery? Check your blood sugar at least 4 times a day, starting 2 days before surgery, to make sure that the level is not too high or low. Check your blood sugar the morning of your surgery when you wake up and every 2 hours until you get to the Short Stay unit. If your blood sugar is less than 70 mg/dL, you will need to treat for low blood sugar: Do not take insulin. Treat a low blood sugar (less than 70 mg/dL) with  cup of clear juice (cranberry or apple), 4 glucose tablets, OR glucose gel. Recheck blood sugar in 15 minutes after treatment (to make sure it is greater than 70 mg/dL). If your blood sugar is not greater than 70 mg/dL on recheck, call [phone] for further instructions. Report your blood sugar to the short stay nurse when you get to Short Stay.  If you are admitted to the hospital after surgery: Your blood sugar will be checked by the staff  and you will probably be given insulin after surgery (instead of oral diabetes medicines) to make sure you have good blood sugar levels. The goal for blood sugar control after surgery is 80-180 mg/dL.   WHAT DO I DO ABOUT MY DIABETES MEDICATION?  DO NOT TAKE THE FOLLOWING 7 DAYS PRIOR TO SURGERY: Ozempic, Wegovy, Rybelsus (Semaglutide), Byetta (exenatide), Bydureon (exenatide ER), Victoza, Saxenda (liraglutide), or Trulicity (dulaglutide) Mounjaro (Tirzepatide) Adlyxin (Lixisenatide), Polyethylene Glycol Loxenatide. HOLD semaglutide after: 02/28/23   DO NOT TAKE ANY ORAL DIABETIC MEDICATIONS DAY OF YOUR SURGERY  Stop vitamins,supplements and over the counter meds 7 days before procedure.              These are anesthesia recommendations for holding your anticoagulants.  Please  contact your prescribing physician to confirm IF it is safe to hold your anticoagulants for this length of time.  HOLD Xarelto after: 03/04/23 Eliquis Apixaban   72 hours   Xarelto Rivaroxaban   72 hours  Plavix Clopidogrel   120 hours  Pletal Cilostazol   120 hours                   You may not have any metal on your body including hair pins, jewelry, and body piercing             Do not wear lotions, powders, perfumes/cologne, or deodorant              Men may shave face and neck.   Do not bring valuables to the hospital. [place] IS NOT             RESPONSIBLE   FOR VALUABLES.   Contacts, glasses, or bridgework may not be worn into surgery.   Bring small overnight bag day of surgery.   DO NOT BRING YOUR HOME MEDICATIONS TO THE HOSPITAL. PHARMACY WILL DISPENSE MEDICATIONS LISTED ON YOUR MEDICATION LIST TO YOU DURING YOUR ADMISSION IN THE HOSPITAL!    Patients discharged on the day of surgery will not be allowed to drive home.  Someone NEEDS to stay with you for the first 24 hours after anesthesia.   Special Instructions: Bring a copy of your healthcare power of attorney and living will documents         the day of surgery if you haven't scanned them before.              Please read over the following fact sheets you were given: IF YOU HAVE QUESTIONS ABOUT YOUR PRE-OP INSTRUCTIONS PLEASE CALL [phone]    [facility] - Preparing for Surgery Before surgery, you can play an important role.  Because skin is not sterile, your skin needs to be as free of germs as possible.  You can reduce the number of germs on your skin by washing with CHG (chlorahexidine gluconate) soap before surgery.  CHG is an antiseptic cleaner which kills germs and bonds with the skin to continue killing germs even after washing. Please DO NOT use if you have an allergy to CHG or antibacterial soaps.  If your skin becomes reddened/irritated stop using the CHG and inform your nurse when you arrive at Short  Stay. Do not shave (including legs and underarms) for at least 48 hours prior to the first CHG shower.  You may shave your face/neck. Please follow these instructions carefully:  1.  Shower with CHG Soap the night before surgery and the  morning of Surgery.  2.  If you choose to wash your hair,  wash your hair first as usual with your  normal  shampoo.  3.  After you shampoo, rinse your hair and body thoroughly to remove the  shampoo.                           4.  Use CHG as you would any other liquid soap.  You can apply chg directly  to the skin and wash                       Gently with a scrungie or clean washcloth.  5.  Apply the CHG Soap to your body ONLY FROM THE NECK DOWN.   Do not use on face/ open                           Wound or open sores. Avoid contact with eyes, ears mouth and genitals (private parts).                       Wash face,  Genitals (private parts) with your normal soap.             6.  Wash thoroughly, paying special attention to the area where your surgery  will be performed.  7.  Thoroughly rinse your body with warm water from the neck down.  8.  DO NOT shower/wash with your normal soap after using and rinsing off  the CHG Soap.                9.  Pat yourself dry with a clean towel.            10.  Wear clean pajamas.            11.  Place clean sheets on your bed the night of your first shower and do not  sleep with pets. Day of Surgery : Do not apply any lotions/deodorants the morning of surgery.  Please wear clean clothes to the hospital/surgery center.  FAILURE TO FOLLOW THESE INSTRUCTIONS MAY RESULT IN THE CANCELLATION OF YOUR SURGERY PATIENT SIGNATURE_________________________________  NURSE SIGNATURE__________________________________  ________________________________________________________________________

## 2023-02-28 ENCOUNTER — Encounter (HOSPITAL_COMMUNITY)
Admission: RE | Admit: 2023-02-28 | Discharge: 2023-02-28 | Disposition: A | Discharge status: Home / Self Care | Payer: 59 | Source: Ambulatory Visit | Attending: Urology | Admitting: Urology

## 2023-02-28 ENCOUNTER — Encounter (HOSPITAL_COMMUNITY): Payer: Self-pay

## 2023-02-28 ENCOUNTER — Other Ambulatory Visit: Payer: Self-pay

## 2023-02-28 VITALS — BP 139/93 | HR 73 | Temp 98.3°F | Ht 73.0 in | Wt 330.0 lb

## 2023-02-28 DIAGNOSIS — Z01812 Encounter for preprocedural laboratory examination: Secondary | ICD-10-CM | POA: Diagnosis not present

## 2023-02-28 DIAGNOSIS — Z01818 Encounter for other preprocedural examination: Secondary | ICD-10-CM | POA: Diagnosis present

## 2023-02-28 DIAGNOSIS — Z7901 Long term (current) use of anticoagulants: Secondary | ICD-10-CM | POA: Insufficient documentation

## 2023-02-28 DIAGNOSIS — E119 Type 2 diabetes mellitus without complications: Secondary | ICD-10-CM

## 2023-02-28 DIAGNOSIS — I10 Essential (primary) hypertension: Secondary | ICD-10-CM | POA: Insufficient documentation

## 2023-02-28 DIAGNOSIS — Z86711 Personal history of pulmonary embolism: Secondary | ICD-10-CM | POA: Diagnosis not present

## 2023-02-28 DIAGNOSIS — N201 Calculus of ureter: Secondary | ICD-10-CM | POA: Insufficient documentation

## 2023-02-28 DIAGNOSIS — E1151 Type 2 diabetes mellitus with diabetic peripheral angiopathy without gangrene: Secondary | ICD-10-CM | POA: Diagnosis not present

## 2023-02-28 HISTORY — DX: Peripheral vascular disease, unspecified: I73.9

## 2023-02-28 HISTORY — DX: Essential (primary) hypertension: I10

## 2023-02-28 LAB — GLUCOSE, CAPILLARY: Glucose-Capillary: 87 mg/dL (ref 70–99)

## 2023-02-28 NOTE — Progress Notes (Addendum)
For Anesthesia: PCP - Erskine Emery, NP  Cardiologist - Corky Crafts, MD Clearance: pending: appointment with pulmonologist on: 03/02/23 Bowel Prep reminder:  Chest x-ray - 01/30/23 EKG - 01/30/23 Stress Test -  ECHO - 01/30/23 Cardiac Cath -  Pacemaker/ICD device last checked: Pacemaker orders received: Device Rep notified:  Spinal Cord Stimulator:N/A  Sleep Study - N/A CPAP -   Fasting Blood Sugar :120's - 140's -  Checks Blood Sugar ___2__ times a day Date and result of last Hgb A1c-6.6: 01/29/23  Last dose of GLP1 agonist- semaglutide GLP1 instructions: To hold it after: 02/28/23  Last dose of SGLT-2 inhibitors- N/A SGLT-2 instructions:   Blood Thinner Instructions: Xarelto will be hold after: 03/04/23,as per pt. It will bridge with Lovenox, he has not received those instructions yet. Aspirin Instructions: Last Dose:  Activity level: Can go up a flight of stairs and activities of daily living without stopping and without chest pain and/or shortness of breath   Able to exercise without chest pain and/or shortness of breath  Anesthesia review: Hx: HTN,DIA,PE,Cellulitis: right leg,DVT.  Patient denies shortness of breath, fever, cough and chest pain at PAT appointment   Patient verbalized understanding of instructions that were given to them at the PAT appointment. Patient was also instructed that they will need to review over the PAT instructions again at home before surgery.

## 2023-03-02 ENCOUNTER — Ambulatory Visit: Payer: 59 | Admitting: Nurse Practitioner

## 2023-03-03 NOTE — Progress Notes (Addendum)
 Anesthesia Chart Review   Case: [id number] Date/Time: 03/08/23 1130   Procedures:      CYSTOSCOPY/LEFT URETEROSCOPY/HOLMIUM LASER/LITHOTRIPSY/STENT PLACEMENT (Left) - 90 MINUTES NEEDED     STONE EXTRACTION WITH BASKET   Anesthesia type: General   Pre-op diagnosis: LEFT URETEROPELVIC JUNCTION   Location: [facility] ROOM 08 / [facility]   Surgeons: Crist Fat, MD       DISCUSSION:53 y.o. never smoker with h/o HTN, PE, PVD, DM, left ureteropelvic junction stone scheduled for above procedure 2/29/2025 with Dr. Berniece Salines.   Pt s/p submassive saddle PE 01/29/2023. Per hem/onc notes 02/20/2023, "Patient reports he might have to have surgery in the near future.  At minimum would recommend waiting on till patient finishes his starter pack.  Preferably, would waiting 2 to 3 months if possible.  Either way, patient will require a standard Lovenox bridge surrounding any surgery including renal stone extraction.  Hold Xarelto starting 2 days prior to procedure and initiate Lovenox 1 mg/kg every 12 hours.  Hold Lovenox dosing the morning of procedure, but then reinitiate that evening.  Patient should start his Xarelto back 1 day after procedure and then discontinue Lovenox 2 days after procedure."  Per pulmonology note 03/06/2023, "Preop pulmonary/respiratory exam Preop pulmonary risk assessment . Discussed with Dr. Wynona Neat . Patient is moderate to High risk with recent submassive PE with right heart strain requiring cath directed thrombolysis . Should avoid surgeries/procedure until after 3 months on therapy if possible if deemed medically necessary by surgical team. As recommended by Hematology -Lovenox bridge as directed.  Urology records and repeat CT -renal are not available for review .  Would be a moderate to high surgical risk from pulmonary standpoint from pulmonary standpoint .  Discussed with patient in detail"  Per Dr. Marlou Porch will proceed due to obstruction.  VS: BP (!) 139/93   Pulse 73    Temp 36.8 C (Oral)   Ht 6\' 1"  (1.854 m)   Wt (!) 149.7 kg   SpO2 97%   BMI 43.54 kg/m   PROVIDERS:  Erskine Emery, NP is PCP    LABS: Labs reviewed: Acceptable for surgery. (all labs ordered are listed, but only abnormal results are displayed)  Labs Reviewed  GLUCOSE, CAPILLARY     IMAGES:   EKG:   CV: Echo 01/30/2023 1. Left ventricular ejection fraction, by estimation, is 65 to 70%. The  left ventricle has normal function. The left ventricle has no regional  wall motion abnormalities. Left ventricular diastolic parameters are  indeterminate.   2. Right ventricular systolic function is mildly reduced. The right  ventricular size is mildly enlarged. Tricuspid regurgitation signal is  inadequate for assessing PA pressure.   3. The mitral valve is grossly normal. No evidence of mitral valve  regurgitation. No evidence of mitral stenosis.   4. The aortic valve is grossly normal. Aortic valve regurgitation is not  visualized. No aortic stenosis is present.   5. The inferior vena cava is dilated in size with >50% respiratory  variability, suggesting right atrial pressure of 8 mmHg.  Past Medical History:  Diagnosis Date   Cancer (HCC)    basal cell skin cancer   Cellulitis of right leg 09/17/2013   COVID-19    Diabetes mellitus without complication (HCC)    DVT (deep venous thrombosis) (HCC)    Dyspnea    due to PE   History of kidney stones    Hypertension    Morbid obesity (HCC)  Peripheral vascular disease (HCC)    Pulmonary embolism (HCC) 04/2020    Past Surgical History:  Procedure Laterality Date   ARTHROSCOPY, SHOULDER, WITH SUPERIOR CAPSULE RECONSTRUCTION Left 07/11/2022   CLAVICLE HARDWARE REMOVAL Left 01/10/2005   CYSTOSCOPY/URETEROSCOPY/HOLMIUM LASER/STENT PLACEMENT Right 12/31/2020   Procedure: CYSTOSCOPY/URETEROSCOPY/HOLMIUM LASER/STENT PLACEMENT;  Surgeon: Orson Ape, MD;  Location: [facility];  Service: Urology;  Laterality: Right;    EXTRACORPOREAL SHOCK WAVE LITHOTRIPSY Right 05/16/2019   Procedure: EXTRACORPOREAL SHOCK WAVE LITHOTRIPSY (ESWL);  Surgeon: Orson Ape, MD;  Location: [facility];  Service: Urology;  Laterality: Right;   EXTRACORPOREAL SHOCK WAVE LITHOTRIPSY Right 07/18/2019   Procedure: EXTRACORPOREAL SHOCK WAVE LITHOTRIPSY (ESWL);  Surgeon: Orson Ape, MD;  Location: [facility];  Service: Urology;  Laterality: Right;   EXTRACORPOREAL SHOCK WAVE LITHOTRIPSY Left 10/24/2019   Procedure: EXTRACORPOREAL SHOCK WAVE LITHOTRIPSY (ESWL);  Surgeon: Orson Ape, MD;  Location: [facility];  Service: Urology;  Laterality: Left;   FRACTURE SURGERY Left 01/10/2005   "shattered clavicle; broke it in 9 places"   IR ANGIOGRAM PULMONARY BILATERAL SELECTIVE  01/29/2023   IR ANGIOGRAM SELECTIVE EACH ADDITIONAL VESSEL  01/29/2023   IR ANGIOGRAM SELECTIVE EACH ADDITIONAL VESSEL  01/29/2023   IR INFUSION THROMBOL ARTERIAL INITIAL (MS)  01/29/2023   IR INFUSION THROMBOL ARTERIAL INITIAL (MS)  01/29/2023   IR THROMB F/U EVAL ART/VEN FINAL DAY (MS)  01/30/2023   IR US GUIDE VASC ACCESS RIGHT  01/29/2023    MEDICATIONS:  losartan (COZAAR) 25 MG tablet   metFORMIN (GLUCOPHAGE) 500 MG tablet   rivaroxaban (XARELTO) 20 MG TABS tablet   RIVAROXABAN (XARELTO) VTE STARTER PACK (15 & 20 MG)   rosuvastatin (CRESTOR) 5 MG tablet   Semaglutide 14 MG TABS   No current facility-administered medications for this encounter.     Jodell Cipro Ward, PA-C [facility] [phone]

## 2023-03-06 ENCOUNTER — Ambulatory Visit: Payer: 59 | Admitting: Adult Health

## 2023-03-06 ENCOUNTER — Encounter: Payer: Self-pay | Admitting: Adult Health

## 2023-03-06 VITALS — BP 132/78 | HR 82 | Ht 73.0 in | Wt 341.0 lb

## 2023-03-06 DIAGNOSIS — N200 Calculus of kidney: Secondary | ICD-10-CM | POA: Insufficient documentation

## 2023-03-06 DIAGNOSIS — Z01811 Encounter for preprocedural respiratory examination: Secondary | ICD-10-CM | POA: Insufficient documentation

## 2023-03-06 DIAGNOSIS — R0609 Other forms of dyspnea: Secondary | ICD-10-CM

## 2023-03-06 DIAGNOSIS — I2699 Other pulmonary embolism without acute cor pulmonale: Secondary | ICD-10-CM

## 2023-03-06 NOTE — Assessment & Plan Note (Addendum)
 Recurrent PE and DVT-recent admission with submassive acute PE with right heart strain requiring catheter directed thrombus. Continue on Lifelong anticoagulation therapy. Patient education given.  Appears to be clinically improving . Recommend echo in 3 months to evaluate Right heart strain.  Plan Patient Instructions  Surgical Risk assessment- Moderate to High .  Take Lovenox as directed.  Resume Xarelto as directed after procedure.  Avoid NSAIDs medications such as Motrin, Ibuprofen, Aleve, etc.  2 D echo in 3 months .  Keep follow up with Urology .  Follow up with Dr. Wynona Neat in 3 months and As needed   Please contact office for sooner follow up if symptoms do not improve or worsen or seek emergency care

## 2023-03-06 NOTE — Assessment & Plan Note (Signed)
 Preop pulmonary risk assessment . Discussed with Dr. Wynona Neat . Patient is moderate to High risk with recent submassive PE with right heart strain requiring cath directed thrombolysis . Should avoid surgeries/procedure until after 3 months on therapy if possible if deemed medically necessary by surgical team. As recommended by Hematology -Lovenox bridge as directed.  Urology records and repeat CT -renal are not available for review .  Would be a moderate to high surgical risk from pulmonary standpoint from pulmonary standpoint .  Discussed with patient in detail .

## 2023-03-06 NOTE — Progress Notes (Signed)
 @Patient  ID: Nicholas [name], male    DOB: [birth year], 53 y.o.   MRN: [id number]  No chief complaint on file.   Referring provider: Erskine Emery, NP  HPI: 53 year old male with history of pulmonary embolism following COVID 19 infection 2022 (treated with anticoagulation rx for 1 year )  Medical history significant for diabetes morbid obesity. TEST/EVENTS :  Hypercoagulable panel negative 2023  03/06/2023 Post Hospital follow up  Patient returns for a posthospital follow-up.  Patient was last seen in the office in June 2023.  He has a history of PE in 2022 after COVID-19 infection.  He was treated with 1 year of anticoagulation therapy.  Hypercoagulable panel was negative.  Patient was admitted last month acute submassive pulmonary embolism.  Patient had a syncopal episode with associated severe shortness of breath.  He was admitted to the ICU.  Found to have acute submassive pulmonary embolism with right heart strai and right DVT.  He underwent catheter directed thrombolysis with interventional radiology.  He was treated with heparin drip and transition to Xarelto.  2D echo showed decreased RV function.  During hospitalization had kidney stone with severe back pain.  Workup revealed a left kidney stone with mild hydronephrosis.  Seen by urology.  Patient says since discharge he is doing well.  Was started on Xarelto starter pack.  He has been seen by hematology.  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  His shortness of breath has improved as well.  O2 saturations today in office are 98% on room air.  Patient says he is back to full-time work.  He has had no hemoptysis no chest pain orthopnea or increased leg swelling.  Has been recommended for lifelong anticoagulation therapy as this is a recurrent PE and DVT. Patient has been seen in the outpatient setting by urology and recommended to undergo cystoscopy with lithotripsy and ureterscopy. On 03/08/23 . Urology notes and repeat CT are  unavailable for today's visit. Patient was seen by Hematology with recommendations best option to wait 3 months before procedure/surgery if possible. If deemed medically necessary to bridge with Lovenox which patient started this morning. Last dose of Xarelto was Saturday, was not able to get Lovenox until this morning . On 150mg  Twice daily until 2 days after procedure (held day off procedure) . Xarelto on hold until day after procedure. . .    Allergies  Allergen Reactions   Lisinopril     unknown   Amoxicillin Rash   Penicillins Other (See Comments)    Childhood reaction     There is no immunization history on file for this patient.  Past Medical History:  Diagnosis Date   Cancer (HCC)    basal cell skin cancer   Cellulitis of right leg 09/17/2013   COVID-19    Diabetes mellitus without complication (HCC)    DVT (deep venous thrombosis) (HCC)    Dyspnea    due to PE   History of kidney stones    Hypertension    Morbid obesity (HCC)    Peripheral vascular disease (HCC)    Pulmonary embolism (HCC) 04/2020    Tobacco History: Social History   Tobacco Use  Smoking Status Never  Smokeless Tobacco Former   Types: Snuff   Counseling given: Not Answered   Outpatient Medications Prior to Visit  Medication Sig Dispense Refill   enoxaparin (LOVENOX) 150 MG/ML injection Inject 150 mg into the skin.     losartan (COZAAR) 25 MG tablet  Take 12.5 mg by mouth at bedtime.     metFORMIN (GLUCOPHAGE) 500 MG tablet Take 1,000 mg by mouth 2 (two) times daily.     rosuvastatin (CRESTOR) 5 MG tablet Take 5 mg by mouth at bedtime.     Semaglutide 14 MG TABS Take 14 mg by mouth daily.     rivaroxaban (XARELTO) 20 MG TABS tablet Take 1 tablet (20 mg total) by mouth daily with supper. (Patient not taking: Reported on 03/06/2023) 90 tablet 1   RIVAROXABAN (XARELTO) VTE STARTER PACK (15 & 20 MG) Follow package directions: Take one 15mg  tablet by mouth twice a day. On day 22, switch to one 20mg   tablet once a day. Take with food. 51 each 0   No facility-administered medications prior to visit.     Review of Systems:   Constitutional:   No  weight loss, night sweats,  Fevers, chills, fatigue, or  lassitude.  HEENT:   No headaches,  Difficulty swallowing,  Tooth/dental problems, or  Sore throat,                No sneezing, itching, ear ache, nasal congestion, post nasal drip,   CV:  No chest pain,  Orthopnea, PND, swelling in lower extremities, anasarca, dizziness, palpitations, syncope.   GI  No heartburn, indigestion, abdominal pain, nausea, vomiting, diarrhea, change in bowel habits, loss of appetite, bloody stools.   Resp: No shortness of breath with exertion or at rest.  No excess mucus, no productive cough,  No non-productive cough,  No coughing up of blood.  No change in color of mucus.  No wheezing.  No chest wall deformity  Skin: no rash or lesions.  GU: no dysuria, change in color of urine, no urgency or frequency.  No flank pain, no hematuria   MS:  No joint pain or swelling.  No decreased range of motion.  No back pain.    Physical Exam  BP 132/78   Pulse 82   Ht 6\' 1"  (1.854 m)   Wt (!) 341 lb (154.7 kg)   SpO2 98%   BMI 44.99 kg/m   GEN: A/Ox3; pleasant , NAD, BMI 44    HEENT:  [place]/AT,  NOSE-clear, THROAT-clear, no lesions, no postnasal drip or exudate noted.   NECK:  Supple w/ fair ROM; no JVD; normal carotid impulses w/o bruits; no thyromegaly or nodules palpated; no lymphadenopathy.    RESP  Clear  P & A; w/o, wheezes/ rales/ or rhonchi. no accessory muscle use, no dullness to percussion  CARD:  RRR, no m/r/g, tr  peripheral edema, pulses intact, no cyanosis or clubbing.  GI:   Soft & nt; nml bowel sounds; no organomegaly or masses detected.   Musco: Warm bil, no deformities or joint swelling noted.   Neuro: alert, no focal deficits noted.    Skin: Warm, no lesions or rashes    Lab Results:  CBC    Component Value Date/Time   WBC  12.4 (H) 02/20/2023 1157   RBC 4.44 02/20/2023 1157   HGB 13.4 02/20/2023 1157   HCT 40.2 02/20/2023 1157   PLT 268 02/20/2023 1157   MCV 90.5 02/20/2023 1157   MCH 30.2 02/20/2023 1157   MCHC 33.3 02/20/2023 1157   RDW 13.4 02/20/2023 1157   LYMPHSABS 2.6 02/20/2023 1157   MONOABS 0.8 02/20/2023 1157   EOSABS 0.1 02/20/2023 1157   BASOSABS 0.1 02/20/2023 1157    BMET    Component Value Date/Time   NA 139  02/20/2023 1157   K 3.9 02/20/2023 1157   CL 106 02/20/2023 1157   CO2 26 02/20/2023 1157   GLUCOSE 117 (H) 02/20/2023 1157   BUN 11 02/20/2023 1157   CREATININE 0.87 02/20/2023 1157   CALCIUM 9.0 02/20/2023 1157   GFRNONAA >60 02/20/2023 1157   GFRAA >90 09/21/2013 0725    BNP    Component Value Date/Time   BNP 29.9 01/29/2023 1519    ProBNP No results found for: "PROBNP"  Imaging: No results found.  Administration History     None          Latest Ref Rng & Units 06/27/2013    8:03 AM  PFT Results  FVC-Pre L 4.66   FVC-Predicted Pre % 80   FVC-Post L 4.84   FVC-Predicted Post % 83   Pre FEV1/FVC % % 84   Post FEV1/FCV % % 85   FEV1-Pre L 3.90   FEV1-Predicted Pre % 85   FEV1-Post L 4.13   DLCO uncorrected ml/min/mmHg 39.46   DLCO UNC% % 108   DLCO corrected ml/min/mmHg 38.62   DLCO COR %Predicted % 106   DLVA Predicted % 116   TLC L 6.45   TLC % Predicted % 85   RV % Predicted % 74     No results found for: "NITRICOXIDE"      Assessment & Plan:   PE (pulmonary thromboembolism) (HCC) Recurrent PE and DVT-recent admission with submassive acute PE with right heart strain requiring catheter directed thrombus. Continue on Lifelong anticoagulation therapy. Patient education given.  Appears to be clinically improving . Recommend echo in 3 months to evaluate Right heart strain.  Plan Patient Instructions  Surgical Risk assessment- Moderate to High .  Take Lovenox as directed.  Resume Xarelto as directed after procedure.  Avoid NSAIDs  medications such as Motrin, Ibuprofen, Aleve, etc.  2 D echo in 3 months .  Keep follow up with Urology .  Follow up with Dr. Wynona Neat in 3 months and As needed   Please contact office for sooner follow up if symptoms do not improve or worsen or seek emergency care       Preop pulmonary/respiratory exam Preop pulmonary risk assessment . Discussed with Dr. Wynona Neat . Patient is moderate to High risk with recent submassive PE with right heart strain requiring cath directed thrombolysis . Should avoid surgeries/procedure until after 3 months on therapy if possible if deemed medically necessary by surgical team. As recommended by Hematology -Lovenox bridge as directed.  Urology records and repeat CT -renal are not available for review .  Would be a moderate to high surgical risk from pulmonary standpoint from pulmonary standpoint .  Discussed with patient in detail .     I spent 41  minutes dedicated to the care of this patient on the date of this encounter to include pre-visit review of records, face-to-face time with the patient discussing conditions above, post visit ordering of testing, clinical documentation with the electronic health record, making appropriate referrals as documented, and communicating necessary findings to members of the patients care team.   Rubye Oaks, NP 03/06/2023

## 2023-03-06 NOTE — Patient Instructions (Addendum)
 Surgical Risk assessment- Moderate to High .  Take Lovenox as directed.  Resume Xarelto as directed after procedure.  Avoid NSAIDs medications such as Motrin, Ibuprofen, Aleve, etc.  2 D echo in 3 months .  Keep follow up with Urology .  Follow up with Dr. Wynona Neat in 3 months and As needed   Please contact office for sooner follow up if symptoms do not improve or worsen or seek emergency care

## 2023-03-07 NOTE — Anesthesia Preprocedure Evaluation (Signed)
 Anesthesia Evaluation  Patient identified by MRN, date of birth, ID band Patient awake    Reviewed: Allergy & Precautions, NPO status , Patient's Chart, lab work & pertinent test results  Airway Mallampati: III  TM Distance: >3 FB Neck ROM: Full    Dental no notable dental hx. (+) Teeth Intact, Dental Advisory Given   Pulmonary shortness of breath and with exertion   Pulmonary exam normal        Cardiovascular hypertension, Pt. on medications + Peripheral Vascular Disease  Normal cardiovascular exam  Echo 01/30/2023 1. Left ventricular ejection fraction, by estimation, is 65 to 70%. The  left ventricle has normal function. The left ventricle has no regional  wall motion abnormalities. Left ventricular diastolic parameters are  indeterminate.   2. Right ventricular systolic function is mildly reduced. The right  ventricular size is mildly enlarged. Tricuspid regurgitation signal is  inadequate for assessing PA pressure.   3. The mitral valve is grossly normal. No evidence of mitral valve  regurgitation. No evidence of mitral stenosis.   4. The aortic valve is grossly normal. Aortic valve regurgitation is not  visualized. No aortic stenosis is present.   5. The inferior vena cava is dilated in size with >50% respiratory  variability, suggesting right atrial pressure of 8 mmHg.     Neuro/Psych negative neurological ROS  negative psych ROS   GI/Hepatic negative GI ROS, Neg liver ROS,,,  Endo/Other  diabetes, Well Controlled, Oral Hypoglycemic Agents  Class 4 obesity  Renal/GU Renal disease  negative genitourinary   Musculoskeletal negative musculoskeletal ROS (+)    Abdominal   Peds negative pediatric ROS (+)  Hematology negative hematology ROS (+)   Anesthesia Other Findings       Reproductive/Obstetrics negative OB ROS                             Anesthesia Physical Anesthesia  Plan  ASA: 4  Anesthesia Plan: General   Post-op Pain Management: Minimal or no pain anticipated and Tylenol PO (pre-op)*   Induction: Intravenous  PONV Risk Score and Plan: 2 and Propofol infusion and Ondansetron  Airway Management Planned: Oral ETT and LMA  Additional Equipment: None  Intra-op Plan:   Post-operative Plan: Extubation in OR  Informed Consent: I have reviewed the patients History and Physical, chart, labs and discussed the procedure including the risks, benefits and alternatives for the proposed anesthesia with the patient or authorized representative who has indicated his/her understanding and acceptance.       Plan Discussed with: CRNA, Anesthesiologist and Surgeon  Anesthesia Plan Comments: (See PAT note 02/28/2023 DISCUSSION:52 y.o. never smoker with h/o HTN, PE, PVD, DM, left ureteropelvic junction stone scheduled for above procedure 2/29/2025 with Dr. Berniece Salines.    Pt s/p submassive saddle PE 01/29/2023. Per hem/onc notes 02/20/2023, "Patient reports he might have to have surgery in the near future.  At minimum would recommend waiting on till patient finishes his starter pack.  Preferably, would waiting 2 to 3 months if possible.  Either way, patient will require a standard Lovenox bridge surrounding any surgery including renal stone extraction.  Hold Xarelto starting 2 days prior to procedure and initiate Lovenox 1 mg/kg every 12 hours.  Hold Lovenox dosing the morning of procedure, but then reinitiate that evening.  Patient should start his Xarelto back 1 day after procedure and then discontinue Lovenox 2 days after procedure."   Per pulmonology note 03/06/2023, "Preop  pulmonary/respiratory exam Preop pulmonary risk assessment . Discussed with Dr. Wynona Neat . Patient is moderate to High risk with recent submassive PE with right heart strain requiring cath directed thrombolysis . Should avoid surgeries/procedure until after 3 months on therapy if possible if  deemed medically necessary by surgical team. As recommended by Hematology -Lovenox bridge as directed.  Urology records and repeat CT -renal are not available for review .  Would be a moderate to high surgical risk from pulmonary standpoint from pulmonary standpoint .  )        Anesthesia Quick Evaluation

## 2023-03-08 ENCOUNTER — Ambulatory Visit (HOSPITAL_COMMUNITY): Payer: 59

## 2023-03-08 ENCOUNTER — Encounter (HOSPITAL_COMMUNITY)
Admission: RE | Disposition: A | Discharge status: Home / Self Care | Payer: Self-pay | Source: Home / Self Care | Attending: Urology

## 2023-03-08 ENCOUNTER — Encounter (HOSPITAL_COMMUNITY): Payer: Self-pay | Admitting: Urology

## 2023-03-08 ENCOUNTER — Ambulatory Visit (HOSPITAL_BASED_OUTPATIENT_CLINIC_OR_DEPARTMENT_OTHER): Payer: Self-pay | Admitting: Anesthesiology

## 2023-03-08 ENCOUNTER — Other Ambulatory Visit: Payer: Self-pay

## 2023-03-08 ENCOUNTER — Ambulatory Visit (HOSPITAL_COMMUNITY): Payer: 59 | Admitting: Physician Assistant

## 2023-03-08 ENCOUNTER — Ambulatory Visit (HOSPITAL_COMMUNITY)
Admission: RE | Admit: 2023-03-08 | Discharge: 2023-03-08 | Disposition: A | Discharge status: Home / Self Care | Payer: 59 | Attending: Urology | Admitting: Urology

## 2023-03-08 DIAGNOSIS — E119 Type 2 diabetes mellitus without complications: Secondary | ICD-10-CM | POA: Insufficient documentation

## 2023-03-08 DIAGNOSIS — Z7984 Long term (current) use of oral hypoglycemic drugs: Secondary | ICD-10-CM | POA: Insufficient documentation

## 2023-03-08 DIAGNOSIS — N202 Calculus of kidney with calculus of ureter: Secondary | ICD-10-CM | POA: Diagnosis present

## 2023-03-08 DIAGNOSIS — I10 Essential (primary) hypertension: Secondary | ICD-10-CM | POA: Diagnosis not present

## 2023-03-08 DIAGNOSIS — N200 Calculus of kidney: Secondary | ICD-10-CM

## 2023-03-08 DIAGNOSIS — N130 Hydronephrosis with ureteropelvic junction obstruction: Secondary | ICD-10-CM

## 2023-03-08 HISTORY — PX: STONE EXTRACTION WITH BASKET: SHX5318

## 2023-03-08 HISTORY — PX: CYSTOSCOPY/URETEROSCOPY/HOLMIUM LASER/STENT PLACEMENT: SHX6546

## 2023-03-08 LAB — GLUCOSE, CAPILLARY
Glucose-Capillary: 118 mg/dL — ABNORMAL HIGH (ref 70–99)
Glucose-Capillary: 101 mg/dL — ABNORMAL HIGH (ref 70–99)

## 2023-03-08 SURGERY — CYSTOSCOPY/URETEROSCOPY/HOLMIUM LASER/STENT PLACEMENT
Anesthesia: General

## 2023-03-08 MED ORDER — LACTATED RINGERS IV SOLN: INTRAVENOUS | Status: DC

## 2023-03-08 MED ORDER — TRAMADOL HCL 50 MG PO TABS: 50.0000 mg | ORAL_TABLET | Freq: Four times a day (QID) | ORAL | 0 refills | Status: DC | PRN

## 2023-03-08 MED ORDER — TAMSULOSIN HCL 0.4 MG PO CAPS: 0.4000 mg | ORAL_CAPSULE | Freq: Every day | ORAL | 0 refills | Status: DC

## 2023-03-08 MED ORDER — MEPERIDINE HCL 50 MG/ML IJ SOLN: 6.2500 mg | INTRAMUSCULAR | Status: DC | PRN

## 2023-03-08 MED ORDER — ONDANSETRON HCL 4 MG/2ML IJ SOLN
INTRAMUSCULAR | Status: AC
  Filled 2023-03-08: qty 2

## 2023-03-08 MED ORDER — ONDANSETRON HCL 4 MG/2ML IJ SOLN
INTRAMUSCULAR | Status: DC | PRN
  Administered 2023-03-08: 4 mg via INTRAVENOUS

## 2023-03-08 MED ORDER — MIDAZOLAM HCL 2 MG/2ML IJ SOLN
INTRAMUSCULAR | Status: AC
  Filled 2023-03-08: qty 2

## 2023-03-08 MED ORDER — SODIUM CHLORIDE 0.9 % IR SOLN
Status: DC | PRN
  Administered 2023-03-08: 6000 mL

## 2023-03-08 MED ORDER — DEXAMETHASONE SODIUM PHOSPHATE 10 MG/ML IJ SOLN
INTRAMUSCULAR | Status: DC | PRN
  Administered 2023-03-08: 4 mg via INTRAVENOUS

## 2023-03-08 MED ORDER — PROPOFOL 1000 MG/100ML IV EMUL
INTRAVENOUS | Status: AC
  Filled 2023-03-08: qty 100

## 2023-03-08 MED ORDER — MIDAZOLAM HCL 5 MG/5ML IJ SOLN
INTRAMUSCULAR | Status: DC | PRN
  Administered 2023-03-08: 2 mg via INTRAVENOUS

## 2023-03-08 MED ORDER — FENTANYL CITRATE PF 50 MCG/ML IJ SOSY: 25.0000 ug | PREFILLED_SYRINGE | INTRAMUSCULAR | Status: DC | PRN

## 2023-03-08 MED ORDER — SUCCINYLCHOLINE CHLORIDE 200 MG/10ML IV SOSY
PREFILLED_SYRINGE | INTRAVENOUS | Status: AC
  Filled 2023-03-08: qty 10

## 2023-03-08 MED ORDER — ROCURONIUM BROMIDE 100 MG/10ML IV SOLN
INTRAVENOUS | Status: DC | PRN
Start: 2023-03-08 — End: 2023-03-08
  Administered 2023-03-08: 10 mg via INTRAVENOUS

## 2023-03-08 MED ORDER — ORAL CARE MOUTH RINSE: 15.0000 mL | Freq: Once | OROMUCOSAL | Status: AC

## 2023-03-08 MED ORDER — PHENAZOPYRIDINE HCL 200 MG PO TABS: 200.0000 mg | ORAL_TABLET | Freq: Three times a day (TID) | ORAL | 0 refills | Status: DC | PRN

## 2023-03-08 MED ORDER — ONDANSETRON HCL 4 MG/2ML IJ SOLN: 4.0000 mg | Freq: Once | INTRAMUSCULAR | Status: DC | PRN

## 2023-03-08 MED ORDER — IOHEXOL 300 MG/ML  SOLN
INTRAMUSCULAR | Status: DC | PRN
  Administered 2023-03-08: 16 mL

## 2023-03-08 MED ORDER — OXYCODONE HCL 5 MG/5ML PO SOLN: 5.0000 mg | Freq: Once | ORAL | Status: DC | PRN

## 2023-03-08 MED ORDER — ACETAMINOPHEN 325 MG PO TABS: 325.0000 mg | ORAL_TABLET | ORAL | Status: DC | PRN

## 2023-03-08 MED ORDER — PROPOFOL 500 MG/50ML IV EMUL
INTRAVENOUS | Status: AC
  Filled 2023-03-08: qty 50

## 2023-03-08 MED ORDER — CHLORHEXIDINE GLUCONATE 0.12 % MT SOLN
15.0000 mL | Freq: Once | OROMUCOSAL | Status: AC
  Administered 2023-03-08: 15 mL via OROMUCOSAL

## 2023-03-08 MED ORDER — LIDOCAINE HCL (PF) 2 % IJ SOLN
INTRAMUSCULAR | Status: AC
  Filled 2023-03-08: qty 5

## 2023-03-08 MED ORDER — ROCURONIUM BROMIDE 10 MG/ML (PF) SYRINGE
PREFILLED_SYRINGE | INTRAVENOUS | Status: AC
  Filled 2023-03-08: qty 10

## 2023-03-08 MED ORDER — PROPOFOL 10 MG/ML IV BOLUS
INTRAVENOUS | Status: AC
  Filled 2023-03-08: qty 20

## 2023-03-08 MED ORDER — PROPOFOL 10 MG/ML IV BOLUS
INTRAVENOUS | Status: DC | PRN
  Administered 2023-03-08: 100 ug/kg/min via INTRAVENOUS
  Administered 2023-03-08: 200 mg via INTRAVENOUS
  Administered 2023-03-08: 90 mg via INTRAVENOUS

## 2023-03-08 MED ORDER — SUCCINYLCHOLINE CHLORIDE 200 MG/10ML IV SOSY
PREFILLED_SYRINGE | INTRAVENOUS | Status: DC | PRN
  Administered 2023-03-08: 180 mg via INTRAVENOUS

## 2023-03-08 MED ORDER — DEXAMETHASONE SODIUM PHOSPHATE 10 MG/ML IJ SOLN
INTRAMUSCULAR | Status: AC
  Filled 2023-03-08: qty 1

## 2023-03-08 MED ORDER — CIPROFLOXACIN HCL 500 MG PO TABS: 500.0000 mg | ORAL_TABLET | Freq: Once | ORAL | 0 refills | Status: AC

## 2023-03-08 MED ORDER — ACETAMINOPHEN 160 MG/5ML PO SOLN: 325.0000 mg | ORAL | Status: DC | PRN

## 2023-03-08 MED ORDER — LIDOCAINE HCL (CARDIAC) PF 100 MG/5ML IV SOSY
PREFILLED_SYRINGE | INTRAVENOUS | Status: DC | PRN
  Administered 2023-03-08: 80 mg via INTRAVENOUS

## 2023-03-08 MED ORDER — FENTANYL CITRATE (PF) 100 MCG/2ML IJ SOLN
INTRAMUSCULAR | Status: DC | PRN
  Administered 2023-03-08: 25 ug via INTRAVENOUS
  Administered 2023-03-08: 50 ug via INTRAVENOUS
  Administered 2023-03-08: 25 ug via INTRAVENOUS
  Administered 2023-03-08: 50 ug via INTRAVENOUS

## 2023-03-08 MED ORDER — CIPROFLOXACIN IN D5W 400 MG/200ML IV SOLN
400.0000 mg | Freq: Two times a day (BID) | INTRAVENOUS | Status: DC
  Administered 2023-03-08: 400 mg via INTRAVENOUS
  Filled 2023-03-08: qty 200

## 2023-03-08 MED ORDER — OXYCODONE HCL 5 MG PO TABS: 5.0000 mg | ORAL_TABLET | Freq: Once | ORAL | Status: DC | PRN

## 2023-03-08 MED ORDER — FENTANYL CITRATE (PF) 100 MCG/2ML IJ SOLN
INTRAMUSCULAR | Status: AC
  Filled 2023-03-08: qty 2

## 2023-03-08 SURGICAL SUPPLY — 21 items
BAG URO CATCHER STRL LF (MISCELLANEOUS) ×2 IMPLANT
BASKET ZERO TIP NITINOL 2.4FR (BASKET) IMPLANT
CATH URETERAL DUAL LUMEN 10F (MISCELLANEOUS) IMPLANT
CATH URETL OPEN 5X70 (CATHETERS) ×2 IMPLANT
CLOTH BEACON ORANGE TIMEOUT ST (SAFETY) ×2 IMPLANT
DRSG TEGADERM 2-3/8X2-3/4 SM (GAUZE/BANDAGES/DRESSINGS) IMPLANT
EXTRACTOR STONE 1.7FRX115CM (UROLOGICAL SUPPLIES) IMPLANT
GLOVE SURG LX STRL 7.5 STRW (GLOVE) ×2 IMPLANT
GOWN STRL REUS W/ TWL XL LVL3 (GOWN DISPOSABLE) ×2 IMPLANT
GUIDEWIRE ANG ZIPWIRE 038X150 (WIRE) IMPLANT
GUIDEWIRE STR DUAL SENSOR (WIRE) ×2 IMPLANT
KIT TURNOVER KIT A (KITS) IMPLANT
LASER FIB FLEXIVA PULSE ID 365 (Laser) IMPLANT
MANIFOLD NEPTUNE II (INSTRUMENTS) ×2 IMPLANT
PACK CYSTO (CUSTOM PROCEDURE TRAY) ×2 IMPLANT
SHEATH NAVIGATOR HD 12/14X28 (SHEATH) IMPLANT
SHEATH NAVIGATOR HD 12/14X36 (SHEATH) IMPLANT
STENT URET 6FRX26 CONTOUR (STENTS) IMPLANT
TRACTIP FLEXIVA PULS ID 200XHI (Laser) IMPLANT
TUBING CONNECTING 10 (TUBING) ×2 IMPLANT
TUBING UROLOGY SET (TUBING) ×2 IMPLANT

## 2023-03-08 NOTE — Anesthesia Postprocedure Evaluation (Signed)
 Anesthesia Post Note  Patient: Nicholas [name] Lung  Procedure(s) Performed: CYSTOSCOPY/LEFT URETEROSCOPY/HOLMIUM LASER/LITHOTRIPSY/STENT PLACEMENT (Left) STONE EXTRACTION WITH BASKET     Patient location during evaluation: PACU Anesthesia Type: General Level of consciousness: awake and alert and oriented Pain management: pain level controlled Vital Signs Assessment: post-procedure vital signs reviewed and stable Respiratory status: spontaneous breathing, nonlabored ventilation and respiratory function stable Cardiovascular status: blood pressure returned to baseline and stable Postop Assessment: no apparent nausea or vomiting Anesthetic complications: no   No notable events documented.  Last Vitals:  Vitals:   03/08/23 1415 03/08/23 1430  BP: 126/83 122/75  Pulse: 73 67  Resp: (!) 24 19  Temp:    SpO2: 94% 95%    Last Pain:  Vitals:   03/08/23 1415  TempSrc:   PainSc: 0-No pain                 Levon Penning A.

## 2023-03-08 NOTE — Op Note (Signed)
 Preoperative diagnosis: left ureteral calculus  Postoperative diagnosis: left ureteral calculus  Procedure:  Cystoscopy left ureteroscopy and stone removal Ureteroscopic laser lithotripsy left 39F x 26 ureteral stent placement  left retrograde pyelography with interpretation  Surgeon: Crist Fat, MD  Anesthesia: General  Complications: None  Intraoperative findings: #1: [name] left retrograde pyelogram demonstrated a large filling defect in the proximal ureter consistent with the patient's known stone.  There was very little contrast that was able to get beyond the stone. #2: I was able to eventually get the wire beyond the stone after a little bit of manipulation, but the stone was completely impacted. #3: The ureteral stone was fragmented into small pieces and removed with a stone basket.  The nonobstructing stones in the left kidney were dusted and the majority of these small sand particles were left in the kidney which I anticipate he will easily pass. #4: A 26 cm time 6 French double-J ureteral stent was left in the left ureter  EBL: Minimal  Specimens: left ureteral calculus  Indication: Nicholas [name] is a 53 y.o.   patient with a large left proximal obstructing ureteral stone and 3 nonobstructing stones in the left renal pelvis.  After reviewing the management options for treatment, the patient elected to proceed with the above surgical procedure(s). We have discussed the potential benefits and risks of the procedure, side effects of the proposed treatment, the likelihood of the patient achieving the goals of the procedure, and any potential problems that might occur during the procedure or recuperation. Informed consent has been obtained.   Description of procedure:  The patient was taken to the operating room and general anesthesia was induced.  The patient was placed in the dorsal lithotomy position, prepped and draped in the usual sterile fashion, and  preoperative antibiotics were administered. A preoperative time-out was performed.   Cystourethroscopy was performed.  [name] urethra was examined and was normal and demonstrated bilobar prostatic hypertrophy. The bladder was then systematically examined in its entirety. There was no evidence for any bladder tumors, stones, or other mucosal pathology.    Attention then turned to the left ureteral orifice and a ureteral catheter was used to intubate the ureteral orifice.  Omnipaque contrast was injected through the ureteral catheter and a retrograde pyelogram was performed with findings as dictated above.  A 0.38 sensor guidewire was then advanced up the left ureter into the renal pelvis under fluoroscopic guidance. The 6 Fr semirigid ureteroscope was then advanced up into the distal ureter, but was unable to get all the way up to the stone.  I then advanced a wire through the scope and remove the scope over the wire.  I then advanced a 12/14 French ureteral access sheath up into the mid ureter removing the inner portion of the sheath and the second wire.  I subsequently advanced the flexible ureteroscope up through the access sheath and to the stone.   The stone was then fragmented with the  270 micron holmium laser fiber on a setting of 0.8 and frequency of 8 Hz.   All stones were then removed from the ureter with an N-gage nitinol basket.  Reinspection of the ureter revealed no remaining visible stones or fragments.   I subsequently advanced the scope up into the renal pelvis.  There was a large stone in the upper pole which I was able to dust.  There is another stone in the midpole which I also was able to dust.  There were 2 small stones in the lower pole which I was able to basket and removed.  I also basketed some of the bigger fragments from the 2 upper pole stones.  The remaining fragments were all very small and too small to basket.  I subsequently back out the scope and the ureteral  access sheath simultaneously noting no significant ureteral trauma.  The wire was then backloaded through the cystoscope and a ureteral stent was advance over the wire using Seldinger technique.  The stent was positioned appropriately under fluoroscopic and cystoscopic guidance.  The wire was then removed with an adequate stent curl noted in the renal pelvis as well as in the bladder.  The bladder was then emptied and the procedure ended.  The patient appeared to tolerate the procedure well and without complications.  The patient was able to be awakened and transferred to the recovery unit in satisfactory condition.   Disposition: The tether of the stent was left on and secured to the ventral aspect of the patient's penis. Instructions for removing the stent have been provided to the patient. The patient has been scheduled for followup in 6 weeks with a renal ultrasound.

## 2023-03-08 NOTE — Transfer of Care (Signed)
 Immediate Anesthesia Transfer of Care Note  Patient: Nicholas [name]  Procedure(s) Performed: CYSTOSCOPY/LEFT URETEROSCOPY/HOLMIUM LASER/LITHOTRIPSY/STENT PLACEMENT (Left) STONE EXTRACTION WITH BASKET  Patient Location: PACU  Anesthesia Type:General  Level of Consciousness: drowsy  Airway & Oxygen Therapy: Patient Spontanous Breathing and Patient connected to face mask oxygen  Post-op Assessment: Report given to RN and Post -op Vital signs reviewed and stable  Post vital signs: Reviewed and stable  Last Vitals:  Vitals Value Taken Time  BP 131/70 03/08/23 1400  Temp    Pulse 75 03/08/23 1401  Resp 20 03/08/23 1401  SpO2 98 % 03/08/23 1401  Vitals shown include unfiled device data.  Last Pain:  Vitals:   03/08/23 0859  TempSrc: Oral  PainSc:          Complications: No notable events documented.

## 2023-03-08 NOTE — H&P (Signed)
 Left obstructing kidney stone.   The patient presents today for follow up. I saw him in hospital where he was admitted for a massive pulmonary embolism. He had incidental finding of a left-sided obstructing stone. At the time of the our consultation the patient was not having any pain. His stone was at the UPJ. He was placed on anticoagulation. He was advised to stay on continuous anticoagulation for at least 4 weeks. As such, we opted to proceed with conservative management seeing as he was not have any pain. He is here today for further evaluation.   In addition to the stone at the patient's left UPJ he has 2 or 3 additional stones in the left kidney. He also has a large stone burden in the right kidney that is not obstructing.   The patient has had several lithotripsy procedures by Dr. Sheppard Penton in the past. He has never had a metabolic stone evaluation.     ALLERGIES: Amoxicillin    MEDICATIONS: Losartan Potassium 25 mg tablet 1 tablet PO Daily  Metformin Hcl 500 mg tablet 1 tablet PO Daily  Rosuvastatin Calcium 5 mg tablet 1 tablet PO Daily  Rybelsus 14 mg tablet 1 tablet PO Daily  Xarelto     GU PSH: None   NON-GU PSH: Shoulder Arthroscopy/surgery - 07/13/2022     GU PMH: Renal and ureteral calculus      PMH Notes: blood clot in lung/ pe    NON-GU PMH: Diabetes Type 2 DVT, History    FAMILY HISTORY: 1 - Daughter Cancer - Mother Deceased - Father, Mother Heart Attack - Runs in Family kidney - Father Kidney Failure - Father Kidney Stones - Mother   SOCIAL HISTORY: Marital Status: Unknown Preferred Language: English; Ethnicity: Not Hispanic Or Latino Current Smoking Status: Patient has never smoked.   Tobacco Use Assessment Completed: Used Tobacco in last 30 days? Does not use smokeless tobacco. Has never drank.  Drinks 1 caffeinated drink per day. Patient's occupation Programmer, applications.    REVIEW OF SYSTEMS:    GU Review Male:   Patient reports burning/ pain with  urination and get up at night to urinate. Patient denies frequent urination, hard to postpone urination, leakage of urine, stream starts and stops, trouble starting your stream, have to strain to urinate , erection problems, and penile pain.  Gastrointestinal (Upper):   Patient reports nausea and vomiting. Patient denies indigestion/ heartburn.  Gastrointestinal (Lower):   Patient reports diarrhea. Patient denies constipation.  Constitutional:   Patient reports weight loss and fatigue. Patient denies fever and night sweats.  Skin:   Patient denies itching and skin rash/ lesion.  Eyes:   Patient denies blurred vision and double vision.  Ears/ Nose/ Throat:   Patient denies sore throat and sinus problems.  Hematologic/Lymphatic:   Patient denies swollen glands and easy bruising.  Cardiovascular:   Patient denies leg swelling and chest pains.  Respiratory:   Patient reports cough and shortness of breath.   Endocrine:   Patient denies excessive thirst.  Musculoskeletal:   Patient denies back pain and joint pain.  Neurological:   Patient denies headaches and dizziness.  Psychologic:   Patient denies depression and anxiety.   Notes: blood in the urine, kidney stones     VITAL SIGNS:      02/13/2023 10:17 AM  Weight 330 lb / 149.69 kg  Height 73 in / 185.42 cm  BP 118/82 mmHg  Pulse 78 /min  Temperature 98.0 F / 36.6 C  BMI  43.5 kg/m   Complexity of Data:  Source Of History:  Patient  Records Review:   Previous Doctor Records, Previous Hospital Records, Previous Patient Records, POC Tool  Urine Test Review:   Urinalysis  Urodynamics Review:   Review Bladder Scan  X-Ray Review: C.T. Abdomen/Pelvis: Reviewed Films. Discussed With Patient.     PROCEDURES:         KUB - [id number]  A single view of the abdomen is obtained. Patient confirmed No Neulasta OnPro Device.  Renal shadows are easily visualized bilaterally. The patient has 3 visible stones in the left kidney in addition to the left  UPJ stone. He also has a large stone burden in the left lower pole. There are no additional calcifications along the expected location of either ureter bilaterally.  Gas pattern is grossly normal. No significant bony abnormalities.      Impression: [name] left UPJ stone remains in a similar place does not appear to have moved. He has bilateral nonobstructing stones.         Visit Complexity - G2211          Urinalysis w/Scope Dipstick Dipstick Cont'd Micro  Color: Amber Bilirubin: Neg mg/dL WBC/hpf: 0 - 5/hpf  Appearance: Slightly Cloudy Ketones: Neg mg/dL RBC/hpf: 40 - 16/XWR  Specific Gravity: 1.025 Blood: 3+ ery/uL Bacteria: Rare (0-9/hpf)  pH: <=5.0 Protein: 1+ mg/dL Cystals: NS (Not Seen)  Glucose: Neg mg/dL Urobilinogen: 0.2 mg/dL Casts: NS (Not Seen)    Nitrites: Neg Trichomonas: Not Present    Leukocyte Esterase: Trace leu/uL Mucous: Not Present      Epithelial Cells: 0 - 5/hpf      Yeast: NS (Not Seen)      Sperm: Not Present    ASSESSMENT:      ICD-10 Details  1 GU:   Renal and ureteral calculus - N20.2      PLAN:           Document Letter(s):  Created for Patient: Clinical Summary         Notes:   The patient has an obstructing left ureteral stone. Fortunately is asymptomatic. However, the stone is quite large, do not think he is going to pass it. He has now been obstructed for what appears to be about 3 weeks. However, the patient is on Xarelto, and he will need to stop that prior to any stone procedure.   I recommended that we perform ureteroscopy to see if we can clean out the left side in 1 procedure. I explained the rationale for. We discussed the risk and the benefits. He will have to stop his Xarelto, and we will get permission to do that. Will plan to get his stone treated in about 3 weeks, 6 weeks from the time of his initial diagnosis of pulmonary embolism and initiation of anticoagulation.

## 2023-03-08 NOTE — Discharge Instructions (Addendum)
 DISCHARGE INSTRUCTIONS FOR KIDNEY STONE/URETERAL STENT   MEDICATIONS:  1.  Resume all your other meds from home - except do not take any extra narcotic pain meds that you may have at home.  2. Pyridium is to help with the burning/stinging when you urinate. 3. Tramadol is for moderate/severe pain, otherwise taking upto 1000 mg every 6 hours of plainTylenol will help treat your pain.   4. Take Cipro one hour prior to removal of your stent.  5. Tamsulosin is for ureteral pain/spasm and should be taken until the stent is removed.  ACTIVITY:  1. No strenuous activity x 1week  2. No driving while on narcotic pain medications  3. Drink plenty of water  4. Continue to walk at home - you can still get blood clots when you are at home, so keep active, but don't over do it.  5. May return to work/school tomorrow or when you feel ready   BATHING:  1. You can shower and we recommend daily showers  2. You have a string coming from your urethra: The stent string is attached to your ureteral stent. Do not pull on this.   SIGNS/SYMPTOMS TO CALL:  Please call us if you have a fever greater than 101.5, uncontrolled nausea/vomiting, uncontrolled pain, dizziness, unable to urinate, bloody urine, chest pain, shortness of breath, leg swelling, leg pain, redness around wound, drainage from wound, or any other concerns or questions.   You can reach [place] at [phone].   FOLLOW-UP:  1. You have an appointment in 6 weeks with a ultrasound of your kidneys prior. 2. You have a string attached to your stent, you may remove it on March 3rd. To do this, pull the strings until the stents are completely removed. You may feel an odd sensation in your back.

## 2023-03-08 NOTE — Anesthesia Procedure Notes (Addendum)
 Procedure Name: Intubation Date/Time: 03/08/2023 11:26 AM  Performed by: Maurene Capes, CRNAPre-anesthesia Checklist: Patient identified, Emergency Drugs available, Suction available and Patient being monitored Patient Re-evaluated:Patient Re-evaluated prior to induction Oxygen Delivery Method: Circle System Utilized Preoxygenation: Pre-oxygenation with 100% oxygen Induction Type: IV induction Ventilation: Mask ventilation without difficulty Laryngoscope Size: Glidescope and 4 Grade View: Grade II Tube type: Oral Tube size: 8.0 mm Number of attempts: 1 Airway Equipment and Method: Rigid stylet Placement Confirmation: ETT inserted through vocal cords under direct vision, positive ETCO2 and breath sounds checked- equal and bilateral Secured at: 23 cm Tube secured with: Tape Dental Injury: Teeth and Oropharynx as per pre-operative assessment

## 2023-03-08 NOTE — Plan of Care (Signed)
    March 08, 2023  Parrish Bonn [address] [place], [place] [address]  To Whom It May Concern,  Please excuse Nicholas [name] from work from __2/26/25____ to __3/3/25__ while they are under my clinical care.  If you have any questions, please let me know.  Sincerely,     Berniece Salines, M.D.

## 2023-03-08 NOTE — H&P (Deleted)
          Document Letter(s):  Created for Patient: Clinical Summary         Notes:   I went over robotic-assisted laparoscopic sacral colpopexy with the patient detail. I explained to the patient the rationale for the surgery. I also went over the placement of the laparoscopic ports. I detailed to her the surgery as well as the postoperative recovery time. I explained to the patient that she could expect to be in the hospital at least one or 2 nights. She will require 4 weeks of no heavy lifting, 6 weeks of no bending or twisting. She will not be able to use her vagina for 6 weeks. I discussed complications of the operation including injury to bowel, ureters, bladder. We also discussed the risk of failure as well as the complications of mesh. I explained to them the difference between transvaginal mesh and the mesh used for sacral colpopexy. I reassured them that there has not been an FDA warnings in regards to sacral colpopexy mesh. We will plan to get this prior to her surgery. I spent 45 minutes with the patient going over that ins and outs of the surgery and answering all her questions.   The patient does not need a mid urethral sling as there is no evidence of stress incontinence on her urodynamics.

## 2023-03-08 NOTE — Interval H&P Note (Signed)
 History and Physical Interval Note:  03/08/2023 10:27 AM  Nicholas [name]  has presented today for surgery, with the diagnosis of LEFT URETEROPELVIC JUNCTION.  The various methods of treatment have been discussed with the patient and family. After consideration of risks, benefits and other options for treatment, the patient has consented to  Procedure(s) with comments: CYSTOSCOPY/LEFT URETEROSCOPY/HOLMIUM LASER/LITHOTRIPSY/STENT PLACEMENT (Left) - 90 MINUTES NEEDED STONE EXTRACTION WITH BASKET (N/A) as a surgical intervention.  [name] history has been reviewed, patient examined, no change in status, stable for surgery.  I have reviewed the patient's chart and labs.  Questions were answered to the patient's satisfaction.     Crist Fat

## 2023-03-09 ENCOUNTER — Encounter (HOSPITAL_COMMUNITY): Payer: Self-pay | Admitting: Urology

## 2023-03-15 LAB — CALCULI, WITH PHOTOGRAPH (CLINICAL LAB)
Calcium Oxalate Dihydrate: 10 %
Calcium Oxalate Monohydrate: 85 %
Hydroxyapatite: 5 %
Weight Calculi: 342 mg

## 2023-03-27 ENCOUNTER — Ambulatory Visit: Payer: 59 | Admitting: Primary Care

## 2023-04-04 ENCOUNTER — Telehealth: Payer: Self-pay | Admitting: Adult Health

## 2023-04-04 NOTE — Telephone Encounter (Signed)
 Patient needs an ECHOCARDIOGRAM  scheduled before his next appointment in May. Patient can be reached at [phone]

## 2023-04-14 ENCOUNTER — Ambulatory Visit (HOSPITAL_COMMUNITY)
Admission: RE | Admit: 2023-04-14 | Discharge: 2023-04-14 | Disposition: A | Discharge status: Home / Self Care | Source: Ambulatory Visit | Attending: Adult Health | Admitting: Adult Health

## 2023-04-14 DIAGNOSIS — I2699 Other pulmonary embolism without acute cor pulmonale: Secondary | ICD-10-CM | POA: Diagnosis not present

## 2023-04-14 DIAGNOSIS — R0600 Dyspnea, unspecified: Secondary | ICD-10-CM | POA: Diagnosis present

## 2023-04-14 DIAGNOSIS — R0609 Other forms of dyspnea: Secondary | ICD-10-CM | POA: Insufficient documentation

## 2023-04-14 DIAGNOSIS — Z86711 Personal history of pulmonary embolism: Secondary | ICD-10-CM | POA: Insufficient documentation

## 2023-04-14 DIAGNOSIS — E119 Type 2 diabetes mellitus without complications: Secondary | ICD-10-CM | POA: Diagnosis not present

## 2023-04-14 LAB — ECHOCARDIOGRAM COMPLETE
AR max vel: 3.75 cm2
AV Peak grad: 6 mmHg
Ao pk vel: 1.22 m/s
Area-P 1/2: 3.77 cm2
MV VTI: 3.67 cm2
S' Lateral: 2.4 cm

## 2023-06-06 ENCOUNTER — Encounter: Payer: Self-pay | Admitting: Pulmonary Disease

## 2023-06-06 ENCOUNTER — Ambulatory Visit (INDEPENDENT_AMBULATORY_CARE_PROVIDER_SITE_OTHER): Payer: 59 | Admitting: Pulmonary Disease

## 2023-06-06 VITALS — BP 117/80 | HR 83 | Ht 73.0 in | Wt 340.4 lb

## 2023-06-06 DIAGNOSIS — Z6841 Body Mass Index (BMI) 40.0 and over, adult: Secondary | ICD-10-CM

## 2023-06-06 DIAGNOSIS — I2699 Other pulmonary embolism without acute cor pulmonale: Secondary | ICD-10-CM | POA: Diagnosis not present

## 2023-06-06 NOTE — Patient Instructions (Addendum)
 Continue your blood thinners  Will see you a year from now  Graded activities as tolerated  Call us  with significant concerns

## 2023-06-06 NOTE — Progress Notes (Signed)
 Nicholas [name]    [id number]    [birth year]  Primary Care Physician:Hodge, Artist Binet, NP  Referring Physician: Marce Sensing, NP [address] [place],  [place] [address]  Chief complaint:   Follow-up for pulmonary embolism  HPI:  Patient was diagnosed with submassive pulmonary embolism in January 2025  Had a PE in 2022 following a COVID diagnosis Was on blood thinners for about a year Was stopped in 2023  Developed shortness of breath with syncopal episode Admitted with submassive pulmonary embolism Had catheter directed thrombolytics which he tolerated well Currently on Xarelto  which he is tolerating well  Activity level is improving Shortness of breath is improving  Denies any chest pain or chest discomfort  Does get short of breath with moderate exertion  Echocardiogram during admission had shown decreased right ventricular function, a repeat echocardiogram shows improvement  Stated he had what I see documented in the chart as cellulitis in 2015, there was an associated blood clot at the time as well  No family history of pulm embolic disease  History of thrombophlebitis, diabetes, morbid obesity  Never smoker  Morbid obesity  Outpatient Encounter Medications as of 06/06/2023  Medication Sig   enoxaparin  (LOVENOX ) 150 MG/ML injection Inject 150 mg into the skin.   losartan (COZAAR) 25 MG tablet Take 12.5 mg by mouth at bedtime.   metFORMIN  (GLUCOPHAGE ) 500 MG tablet Take 1,000 mg by mouth 2 (two) times daily.   phenazopyridine  (PYRIDIUM ) 200 MG tablet Take 1 tablet (200 mg total) by mouth 3 (three) times daily as needed for pain.   rivaroxaban  (XARELTO ) 20 MG TABS tablet Take 1 tablet (20 mg total) by mouth daily with supper.   rosuvastatin (CRESTOR) 5 MG tablet Take 5 mg by mouth at bedtime.   Semaglutide  14 MG TABS Take 14 mg by mouth daily. (Patient not taking: Reported on 06/06/2023)   tamsulosin  (FLOMAX ) 0.4 MG CAPS capsule Take 1 capsule  (0.4 mg total) by mouth daily. (Patient not taking: Reported on 06/06/2023)   traMADol  (ULTRAM ) 50 MG tablet Take 1-2 tablets (50-100 mg total) by mouth every 6 (six) hours as needed for moderate pain (pain score 4-6). (Patient not taking: Reported on 06/06/2023)   No facility-administered encounter medications on file as of 06/06/2023.    Allergies as of 06/06/2023 - Review Complete 03/08/2023  Allergen Reaction Noted   Lisinopril  04/15/2022   Amoxicillin Rash 04/15/2022   Penicillins Other (See Comments)     Past Medical History:  Diagnosis Date   Cancer (HCC)    basal cell skin cancer   Cellulitis of right leg 09/17/2013   COVID-19    Diabetes mellitus without complication (HCC)    DVT (deep venous thrombosis) (HCC)    Dyspnea    due to PE   History of kidney stones    Hypertension    Morbid obesity (HCC)    Peripheral vascular disease (HCC)    Pulmonary embolism (HCC) 04/2020    Past Surgical History:  Procedure Laterality Date   ARTHROSCOPY, SHOULDER, WITH SUPERIOR CAPSULE RECONSTRUCTION Left 07/11/2022   CLAVICLE HARDWARE REMOVAL Left 01/10/2005   CYSTOSCOPY/URETEROSCOPY/HOLMIUM LASER/STENT PLACEMENT Right 12/31/2020   Procedure: CYSTOSCOPY/URETEROSCOPY/HOLMIUM LASER/STENT PLACEMENT;  Surgeon: Rea Cambridge, MD;  Location: [facility];  Service: Urology;  Laterality: Right;   CYSTOSCOPY/URETEROSCOPY/HOLMIUM LASER/STENT PLACEMENT Left 03/08/2023   Procedure: CYSTOSCOPY/LEFT URETEROSCOPY/HOLMIUM LASER/LITHOTRIPSY/STENT PLACEMENT;  Surgeon: Andrez Banker, MD;  Location: [facility];  Service: Urology;  Laterality:  Left;  90 MINUTES NEEDED   EXTRACORPOREAL SHOCK WAVE LITHOTRIPSY Right 05/16/2019   Procedure: EXTRACORPOREAL SHOCK WAVE LITHOTRIPSY (ESWL);  Surgeon: Rea Cambridge, MD;  Location: [facility];  Service: Urology;  Laterality: Right;   EXTRACORPOREAL SHOCK WAVE LITHOTRIPSY Right 07/18/2019   Procedure: EXTRACORPOREAL SHOCK WAVE LITHOTRIPSY (ESWL);  Surgeon: Rea Cambridge, MD;  Location: [facility];  Service: Urology;  Laterality: Right;   EXTRACORPOREAL SHOCK WAVE LITHOTRIPSY Left 10/24/2019   Procedure: EXTRACORPOREAL SHOCK WAVE LITHOTRIPSY (ESWL);  Surgeon: Rea Cambridge, MD;  Location: [facility];  Service: Urology;  Laterality: Left;   FRACTURE SURGERY Left 01/10/2005   "shattered clavicle; broke it in 9 places"   IR ANGIOGRAM PULMONARY BILATERAL SELECTIVE  01/29/2023   IR ANGIOGRAM SELECTIVE EACH ADDITIONAL VESSEL  01/29/2023   IR ANGIOGRAM SELECTIVE EACH ADDITIONAL VESSEL  01/29/2023   IR INFUSION THROMBOL ARTERIAL INITIAL (MS)  01/29/2023   IR INFUSION THROMBOL ARTERIAL INITIAL (MS)  01/29/2023   IR THROMB F/U EVAL ART/VEN FINAL DAY (MS)  01/30/2023   IR US  GUIDE VASC ACCESS RIGHT  01/29/2023   STONE EXTRACTION WITH BASKET N/A 03/08/2023   Procedure: STONE EXTRACTION WITH BASKET;  Surgeon: Andrez Banker, MD;  Location: [facility];  Service: Urology;  Laterality: N/A;    Family History  Problem Relation Age of Onset   Leukemia Mother    Kidney disease Father    Heart attack Maternal Grandfather    Heart attack Paternal Grandfather    Stroke Other    Heart disease Other    Cancer Other     Social History   Socioeconomic History   Marital status: Single    Spouse name: Not on file   Number of children: Not on file   Years of education: Not on file   Highest education level: Not on file  Occupational History   Occupation: Curator    Comment: [employer]  Tobacco Use   Smoking status: Never   Smokeless tobacco: Former    Types: Snuff  Vaping Use   Vaping status: Never Used  Substance and Sexual Activity   Alcohol use: Not Currently   Drug use: No   Sexual activity: Not Currently    Birth control/protection: None  Other Topics Concern   Not on file  Social History Narrative   Not on file   Social Drivers of Health   Financial Resource Strain: Not on file  Food Insecurity: No Food Insecurity (01/29/2023)    Hunger Vital Sign    Worried About Running Out of Food in the Last Year: Never true    Ran Out of Food in the Last Year: Never true  Transportation Needs: No Transportation Needs (01/29/2023)   PRAPARE - Administrator, Civil Service (Medical): No    Lack of Transportation (Non-Medical): No  Physical Activity: Not on file  Stress: Not on file  Social Connections: Unknown (05/25/2021)   [facility] from [facility]   Social Network    Social Network: Not on file  Intimate Partner Violence: Not At Risk (01/29/2023)   Humiliation, Afraid, Rape, and Kick questionnaire    Fear of Current or Ex-Partner: No    Emotionally Abused: No    Physically Abused: No    Sexually Abused: No    Review of Systems  Constitutional:  Negative for fatigue.  Respiratory:  Negative for chest tightness and shortness of breath.     Vitals:   06/06/23 1553  BP:  117/80  Pulse: 83  SpO2: 98%     Physical Exam Constitutional:      Appearance: He is obese.  HENT:     Head: Normocephalic and atraumatic.     Nose: No congestion.     Mouth/Throat:     Mouth: Mucous membranes are moist.  Eyes:     General: No scleral icterus. Cardiovascular:     Rate and Rhythm: Normal rate and regular rhythm.     Heart sounds: No murmur heard.    No friction rub. No gallop.  Pulmonary:     Effort: No respiratory distress.     Breath sounds: No stridor. No wheezing or rhonchi.  Musculoskeletal:     Cervical back: No rigidity or tenderness.  Neurological:     Mental Status: He is alert.  Psychiatric:        Mood and Affect: Mood normal.    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

## 2023-08-31 ENCOUNTER — Other Ambulatory Visit: Payer: Self-pay | Admitting: Urology

## 2023-09-01 ENCOUNTER — Other Ambulatory Visit: Payer: Self-pay | Admitting: Urology

## 2023-09-01 NOTE — Progress Notes (Addendum)
 COVID Vaccine received:  [x]  No []  Yes Date of any COVID positive Test in last 90 days: no PCP - Angeline Iba NP Cardiologist -  Gordy Reek MD  Chest x-ray - 01/30/23 Epic EKG -  01/30/23 Epic Stress Test -  ECHO - 04/14/23 Epic Cardiac Cath -   Bowel Prep - [x]  No  []   Yes ______  Pacemaker / ICD device [x]  No []  Yes   Spinal Cord Stimulator:[x]  No []  Yes       History of Sleep Apnea? [x]  No []  Yes   CPAP used?- [x]  No []  Yes    Does the patient monitor blood sugar?          []  No [x]  Yes  []  N/A  Patient has: []  NO Hx DM   []  Pre-DM                 []  DM1  [x]   DM2 Does patient have a [employer] or Dexacom? [x]  No []  Yes   Fasting Blood Sugar Ranges- 120-140 Checks Blood Sugar ___10__ times a week  GLP1 agonist / usual dose -  GLP1 instructions:  SGLT-2 inhibitors / usual dose - Rebelsus SGLT-2 instructions:   Blood Thinner / Instructions:Xarelto  every PM.Last dose to be 09/04/23 Aspirin Instructions:no  Comments:   Activity level: Patient is able to climb a flight of stairs without difficulty; []  No CP  []  No SOB,    Patient can perform ADLs without assistance.   Anesthesia review: Hx. PE, On Xarelto , HTN, DM  Patient denies shortness of breath, fever, cough and chest pain at PAT appointment.  Patient verbalized understanding and agreement to the Pre-Surgical Instructions that were given to them at this PAT appointment. Patient was also educated of the need to review these PAT instructions again prior to his/her surgery.I reviewed the appropriate phone numbers to call if they have any and questions or concerns.

## 2023-09-01 NOTE — Patient Instructions (Signed)
 SURGICAL WAITING ROOM VISITATION  Patients having surgery or a procedure may have no more than 2 support people in the waiting area - these visitors may rotate.    Children under the age of 87 must have an adult with them who is not the patient.  Visitors with respiratory illnesses are discouraged from visiting and should remain at home.  If the patient needs to stay at the hospital during part of their recovery, the visitor guidelines for inpatient rooms apply. Pre-op nurse will coordinate an appropriate time for 1 support person to accompany patient in pre-op.  This support person may not rotate.    Please refer to the [facility] website for the visitor guidelines for Inpatients (after your surgery is over and you are in a regular room).       Your procedure is scheduled on: 09/08/23   Report to [facility] Main Entrance    Report to admitting at 5:15 AM   Call this number if you have problems the morning of surgery [phone]   Do not eat food or drink liquids :After Midnight.but may have sips of water with meds.        Oral Hygiene is also important to reduce your risk of infection.                                    Remember - BRUSH YOUR TEETH THE MORNING OF SURGERY WITH YOUR REGULAR TOOTHPASTE   Stop all vitamins and herbal supplements 7 days before surgery.   Take these medicines the morning of surgery with A SIP OF WATER:   DO NOT TAKE ANY ORAL DIABETIC MEDICATIONS DAY OF YOUR SURGERY Hold Metformin  the morning of surgery. Hold Semaglutide  the morning of surgery.                              You may not have any metal on your body including hair pins, jewelry, and body piercing             Do not wear make-up, lotions, powders, perfumes/cologne, or deodorant              Men may shave face and neck.   Do not bring valuables to the hospital. [facility] IS NOT             RESPONSIBLE   FOR VALUABLES.   Contacts, glasses, dentures or bridgework may not  be worn into surgery.  DO NOT BRING YOUR HOME MEDICATIONS TO THE HOSPITAL. PHARMACY WILL DISPENSE MEDICATIONS LISTED ON YOUR MEDICATION LIST TO YOU DURING YOUR ADMISSION IN THE HOSPITAL!    Patients discharged on the day of surgery will not be allowed to drive home.  Someone NEEDS to stay with you for the first 24 hours after anesthesia.   Special Instructions: Bring a copy of your healthcare power of attorney and living will documents the day of surgery if you haven't scanned them before.              Please read over the following fact sheets you were given: IF YOU HAVE QUESTIONS ABOUT YOUR PRE-OP INSTRUCTIONS PLEASE CALL [phone] [place]   If you received a COVID test during your pre-op visit  it is requested that you wear a mask when out in public, stay away from anyone that may not be feeling well and notify your  surgeon if you develop symptoms. If you test positive for Covid or have been in contact with anyone that has tested positive in the last 10 days please notify you surgeon.    [place] - Preparing for Surgery Before surgery, you can play an important role.  Because skin is not sterile, your skin needs to be as free of germs as possible.  You can reduce the number of germs on your skin by washing with CHG (chlorahexidine gluconate) soap before surgery.  CHG is an antiseptic cleaner which kills germs and bonds with the skin to continue killing germs even after washing. Please DO NOT use if you have an allergy to CHG or antibacterial soaps.  If your skin becomes reddened/irritated stop using the CHG and inform your nurse when you arrive at Short Stay. Do not shave (including legs and underarms) for at least 48 hours prior to the first CHG shower.  You may shave your face/neck.  Please follow these instructions carefully:  1.  Shower with CHG Soap the night before surgery and the  morning of surgery.  2.  If you choose to wash your hair, wash your hair first as usual with your normal   shampoo.  3.  After you shampoo, rinse your hair and body thoroughly to remove the shampoo.                             4.  Use CHG as you would any other liquid soap.  You can apply chg directly to the skin and wash.  Gently with a scrungie or clean washcloth.  5.  Apply the CHG Soap to your body ONLY FROM THE NECK DOWN.   Do   not use on face/ open                           Wound or open sores. Avoid contact with eyes, ears mouth and   genitals (private parts).                       Wash face,  Genitals (private parts) with your normal soap.             6.  Wash thoroughly, paying special attention to the area where your    surgery  will be performed.  7.  Thoroughly rinse your body with warm water from the neck down.  8.  DO NOT shower/wash with your normal soap after using and rinsing off the CHG Soap.                9.  Pat yourself dry with a clean towel.            10.  Wear clean pajamas.            11.  Place clean sheets on your bed the night of your first shower and do not  sleep with pets. Day of Surgery : Do not apply any lotions/deodorants the morning of surgery.  Please wear clean clothes to the hospital/surgery center.  FAILURE TO FOLLOW THESE INSTRUCTIONS MAY RESULT IN THE CANCELLATION OF YOUR SURGERY  _How to Manage Your Diabetes Before and After Surgery  Why is it important to control my blood sugar before and after surgery? Improving blood sugar levels before and after surgery helps healing and can limit problems. A way of improving blood sugar control is  eating a healthy diet by:  Eating less sugar and carbohydrates  Increasing activity/exercise  Talking with your doctor about reaching your blood sugar goals High blood sugars (greater than 180 mg/dL) can raise your risk of infections and slow your recovery, so you will need to focus on controlling your diabetes during the weeks before surgery. Make sure that the doctor who takes care of your diabetes knows about your  planned surgery including the date and location.  How do I manage my blood sugar before surgery? Check your blood sugar at least 4 times a day, starting 2 days before surgery, to make sure that the level is not too high or low. Check your blood sugar the morning of your surgery when you wake up and every 2 hours until you get to the Short Stay unit. If your blood sugar is less than 70 mg/dL, you will need to treat for low blood sugar: Do not take insulin . Treat a low blood sugar (less than 70 mg/dL) with  cup of clear juice (cranberry or apple), 4 glucose tablets, OR glucose gel. Recheck blood sugar in 15 minutes after treatment (to make sure it is greater than 70 mg/dL). If your blood sugar is not greater than 70 mg/dL on recheck, call [phone] for further instructions. Report your blood sugar to the short stay nurse when you get to Short Stay.  If you are admitted to the hospital after surgery: Your blood sugar will be checked by the staff and you will probably be given insulin  after surgery (instead of oral diabetes medicines) to make sure you have good blood sugar levels. The goal for blood sugar control after surgery is 80-180 mg/dL.   WHAT DO I DO ABOUT MY DIABETES MEDICATION?  Do not take oral diabetes medicines (pills) the morning of surgery.          Hold Metformin  and Semaglutide  the morning of surgery.  DO NOT TAKE THE FOLLOWING 7 DAYS PRIOR TO SURGERY: Ozempic , Wegovy , Rybelsus  (Semaglutide ), Byetta (exenatide), Bydureon (exenatide ER), Victoza, Saxenda (liraglutide), or Trulicity (dulaglutide) Mounjaro (Tirzepatide) Adlyxin (Lixisenatide), Polyethylene Glycol Loxenatide.  Patient Signature:  Date:   Nurse Signature:  Date:

## 2023-09-04 ENCOUNTER — Telehealth: Payer: Self-pay | Admitting: *Deleted

## 2023-09-04 NOTE — Telephone Encounter (Signed)
 Nena from [facility] at a clearance letter on Thursday and the nurse for Dr. Jacobo has not and Dr. Jacobo has been off on vacation and he came back today and the nurse will give him the paper so he can take care of this today.  Surgery is for 8/29

## 2023-09-05 ENCOUNTER — Other Ambulatory Visit: Payer: Self-pay

## 2023-09-05 ENCOUNTER — Encounter (HOSPITAL_COMMUNITY)
Admission: RE | Admit: 2023-09-05 | Discharge: 2023-09-05 | Disposition: A | Discharge status: Home / Self Care | Source: Ambulatory Visit | Attending: Urology | Admitting: Urology

## 2023-09-05 VITALS — BP 136/85 | HR 72 | Temp 98.1°F | Resp 16 | Ht 73.0 in | Wt 343.0 lb

## 2023-09-05 DIAGNOSIS — I10 Essential (primary) hypertension: Secondary | ICD-10-CM | POA: Insufficient documentation

## 2023-09-05 DIAGNOSIS — Z86718 Personal history of other venous thrombosis and embolism: Secondary | ICD-10-CM | POA: Diagnosis not present

## 2023-09-05 DIAGNOSIS — Z01818 Encounter for other preprocedural examination: Secondary | ICD-10-CM

## 2023-09-05 DIAGNOSIS — Z01812 Encounter for preprocedural laboratory examination: Secondary | ICD-10-CM | POA: Insufficient documentation

## 2023-09-05 DIAGNOSIS — Z6841 Body Mass Index (BMI) 40.0 and over, adult: Secondary | ICD-10-CM | POA: Insufficient documentation

## 2023-09-05 DIAGNOSIS — N200 Calculus of kidney: Secondary | ICD-10-CM | POA: Insufficient documentation

## 2023-09-05 DIAGNOSIS — Z86711 Personal history of pulmonary embolism: Secondary | ICD-10-CM | POA: Insufficient documentation

## 2023-09-05 DIAGNOSIS — E119 Type 2 diabetes mellitus without complications: Secondary | ICD-10-CM

## 2023-09-05 DIAGNOSIS — E1151 Type 2 diabetes mellitus with diabetic peripheral angiopathy without gangrene: Secondary | ICD-10-CM | POA: Diagnosis not present

## 2023-09-05 LAB — BASIC METABOLIC PANEL WITH GFR
Anion gap: 8 (ref 5–15)
BUN: 14 mg/dL (ref 6–20)
CO2: 24 mmol/L (ref 22–32)
Calcium: 8.8 mg/dL — ABNORMAL LOW (ref 8.9–10.3)
Chloride: 107 mmol/L (ref 98–111)
Creatinine, Ser: 1.06 mg/dL (ref 0.61–1.24)
GFR, Estimated: >60 mL/min (ref >60)
Glucose, Bld: 121 mg/dL — ABNORMAL HIGH (ref 70–99)
Potassium: 3.9 mmol/L (ref 3.5–5.1)
Sodium: 139 mmol/L (ref 135–145)

## 2023-09-05 LAB — CBC
HCT: 44 % (ref 39.0–52.0)
Hemoglobin: 13.9 g/dL (ref 13.0–17.0)
MCH: 28.2 pg (ref 26.0–34.0)
MCHC: 31.6 g/dL (ref 30.0–36.0)
MCV: 89.2 fL (ref 80.0–100.0)
Platelets: 253 K/uL (ref 150–400)
RBC: 4.93 MIL/uL (ref 4.22–5.81)
RDW: 14.9 % (ref 11.5–15.5)
WBC: 10.2 K/uL (ref 4.0–10.5)
nRBC: 0 % (ref 0.0–0.2)

## 2023-09-05 LAB — HEMOGLOBIN A1C
Hgb A1c MFr Bld: 6.3 % — ABNORMAL HIGH (ref 4.8–5.6)
Mean Plasma Glucose: 134.11 mg/dL

## 2023-09-05 LAB — GLUCOSE, CAPILLARY: Glucose-Capillary: 124 mg/dL — ABNORMAL HIGH (ref 70–99)

## 2023-09-06 NOTE — Anesthesia Preprocedure Evaluation (Signed)
 Anesthesia Evaluation  Patient identified by MRN, date of birth, ID band Patient awake    Reviewed: Allergy & Precautions, NPO status , Patient's Chart, lab work & pertinent test results  Airway Mallampati: III  TM Distance: >3 FB Neck ROM: Full    Dental  (+) Teeth Intact, Dental Advisory Given   Pulmonary PE (2022)   Pulmonary exam normal breath sounds clear to auscultation       Cardiovascular hypertension, Pt. on medications (-) angina + Peripheral Vascular Disease  (-) Past MI Normal cardiovascular exam Rhythm:Regular Rate:Normal     Neuro/Psych negative neurological ROS  negative psych ROS   GI/Hepatic negative GI ROS, Neg liver ROS,,,  Endo/Other  diabetes, Type 2, Oral Hypoglycemic Agents  Class 3 obesity  Renal/GU  LEFT RENAL STONES     Musculoskeletal negative musculoskeletal ROS (+)    Abdominal   Peds  Hematology  (+) Blood dyscrasia (Xarelto )   Anesthesia Other Findings   Reproductive/Obstetrics                              Anesthesia Physical Anesthesia Plan  ASA: 3  Anesthesia Plan: General   Post-op Pain Management: Tylenol  PO (pre-op)* and Toradol  IV (intra-op)*   Induction: Intravenous  PONV Risk Score and Plan: 3 and Midazolam , Ondansetron  and Dexamethasone   Airway Management Planned: LMA  Additional Equipment:   Intra-op Plan:   Post-operative Plan: Extubation in OR  Informed Consent: I have reviewed the patients History and Physical, chart, labs and discussed the procedure including the risks, benefits and alternatives for the proposed anesthesia with the patient or authorized representative who has indicated his/her understanding and acceptance.     Dental advisory given  Plan Discussed with: CRNA  Anesthesia Plan Comments: (See PAT note from 8/26)         Anesthesia Quick Evaluation

## 2023-09-06 NOTE — Progress Notes (Signed)
 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 Follows with Pulmonology. Chronically anticoagulated with Xarleto. Last seen by Pulm on 06/06/23. Stable at that visit. Has chronic DOE with moderate exertion.  Echo done in April 2025 shows normal LVEF, normal RV function with no valvular abnormality.  LD Xarelto : 8/25  VS: BP 136/85   Pulse 72   Temp 36.7 C (Oral)   Resp 16   Ht 6' 1 (1.854 m)   Wt (!) 155.6 kg   SpO2 97%   BMI 45.25 kg/m   PROVIDERS: Silvano Angeline FALCON, NP   LABS: Labs reviewed: Acceptable for surgery. (all labs ordered are listed, but only abnormal results are displayed)  Labs Reviewed  HEMOGLOBIN A1C - Abnormal; Notable for the following components:      Result Value   Hgb A1c MFr Bld 6.3 (*)    All other components within normal limits  BASIC METABOLIC PANEL WITH GFR - Abnormal; Notable for the following components:   Glucose, Bld 121 (*)    Calcium 8.8 (*)    All other components within normal limits  GLUCOSE, CAPILLARY - Abnormal; Notable for the following components:   Glucose-Capillary 124 (*)    All other components within normal limits  CBC     IMAGES:   EKG:   CV:  Echo 04/14/23:  IMPRESSIONS    1. Left ventricular ejection fraction, by estimation, is 60 to 65%. The left ventricle has normal function. The left ventricle has no regional wall motion abnormalities. Left ventricular diastolic  parameters were normal.  2. Right ventricular systolic function is normal. The right ventricular size is normal.  3. The mitral valve is normal in structure. No evidence of mitral valve regurgitation. No evidence of mitral stenosis.  4. The aortic valve is normal in structure. Aortic valve regurgitation is not visualized. No aortic stenosis is present.  5. The inferior vena cava is normal in size with greater than 50% respiratory variability, suggesting right atrial pressure of 3 mmHg.  Past Medical History:  Diagnosis Date   Cancer (HCC)    basal cell skin cancer   Cellulitis of right leg 09/17/2013   COVID-19    Diabetes mellitus without complication (HCC)    DVT (deep venous thrombosis) (HCC)    Dyspnea    due to PE   History of kidney stones    Hypertension    Morbid obesity (HCC)    Peripheral vascular disease (HCC)    Pulmonary embolism (HCC) 04/2020    Past Surgical History:  Procedure Laterality Date   ARTHROSCOPY, SHOULDER, WITH SUPERIOR CAPSULE RECONSTRUCTION Left 07/11/2022   CLAVICLE HARDWARE REMOVAL Left 01/10/2005   CYSTOSCOPY/URETEROSCOPY/HOLMIUM LASER/STENT PLACEMENT Right 12/31/2020   Procedure: CYSTOSCOPY/URETEROSCOPY/HOLMIUM LASER/STENT PLACEMENT;  Surgeon: Kassie Ozell SAUNDERS, MD;  Location: [facility] ORS;  Service: Urology;  Laterality: Right;   CYSTOSCOPY/URETEROSCOPY/HOLMIUM LASER/STENT PLACEMENT Left 03/08/2023   Procedure: CYSTOSCOPY/LEFT URETEROSCOPY/HOLMIUM LASER/LITHOTRIPSY/STENT PLACEMENT;  Surgeon: Cam Morene ORN, MD;  Location:  [facility];  Service: Urology;  Laterality: Left;  90 MINUTES NEEDED   EXTRACORPOREAL SHOCK WAVE LITHOTRIPSY Right 05/16/2019   Procedure: EXTRACORPOREAL SHOCK WAVE LITHOTRIPSY (ESWL);  Surgeon: Kassie Ozell SAUNDERS, MD;  Location: [facility] ORS;  Service: Urology;  Laterality: Right;   EXTRACORPOREAL SHOCK WAVE LITHOTRIPSY Right 07/18/2019   Procedure: EXTRACORPOREAL SHOCK WAVE LITHOTRIPSY (ESWL);  Surgeon: Kassie Ozell SAUNDERS, MD;  Location:  [facility];  Service: Urology;  Laterality: Right;   EXTRACORPOREAL SHOCK WAVE LITHOTRIPSY Left 10/24/2019   Procedure: EXTRACORPOREAL SHOCK WAVE LITHOTRIPSY (ESWL);  Surgeon: Kassie Ozell SAUNDERS, MD;  Location: [facility];  Service: Urology;  Laterality: Left;   FRACTURE SURGERY Left 01/10/2005   shattered clavicle; broke it in 9 places   IR ANGIOGRAM PULMONARY BILATERAL SELECTIVE  01/29/2023   IR ANGIOGRAM SELECTIVE EACH ADDITIONAL VESSEL  01/29/2023   IR ANGIOGRAM SELECTIVE EACH ADDITIONAL VESSEL  01/29/2023   IR INFUSION THROMBOL ARTERIAL INITIAL (MS)  01/29/2023   IR INFUSION THROMBOL ARTERIAL INITIAL (MS)  01/29/2023   IR THROMB F/U EVAL ART/VEN FINAL DAY (MS)  01/30/2023   IR US  GUIDE VASC ACCESS RIGHT  01/29/2023   STONE EXTRACTION WITH BASKET N/A 03/08/2023   Procedure: STONE EXTRACTION WITH BASKET;  Surgeon: Cam Morene ORN, MD;  Location: [facility];  Service: Urology;  Laterality: N/A;    MEDICATIONS:  cholecalciferol (VITAMIN D3) 25 MCG (1000 UNIT) tablet   losartan (COZAAR) 25 MG tablet   metFORMIN  (GLUCOPHAGE ) 500 MG tablet   phenazopyridine  (PYRIDIUM ) 200 MG tablet   rivaroxaban  (XARELTO ) 20 MG TABS tablet   rosuvastatin (CRESTOR) 5 MG tablet   Semaglutide  14 MG TABS   tamsulosin  (FLOMAX ) 0.4 MG CAPS capsule   traMADol  (ULTRAM ) 50 MG tablet   No current facility-administered medications for this encounter.   Burnard CHRISTELLA Odis DEVONNA [facility] Short Stay/Anesthesiology [facility] Phone [phone] 09/06/2023 8:56 AM

## 2023-09-08 ENCOUNTER — Ambulatory Visit (HOSPITAL_BASED_OUTPATIENT_CLINIC_OR_DEPARTMENT_OTHER): Payer: Self-pay | Admitting: Anesthesiology

## 2023-09-08 ENCOUNTER — Ambulatory Visit (HOSPITAL_COMMUNITY): Payer: Self-pay | Admitting: Medical

## 2023-09-08 ENCOUNTER — Encounter (HOSPITAL_COMMUNITY): Payer: Self-pay | Admitting: Urology

## 2023-09-08 ENCOUNTER — Ambulatory Visit (HOSPITAL_COMMUNITY)

## 2023-09-08 ENCOUNTER — Encounter (HOSPITAL_COMMUNITY)
Admission: RE | Disposition: A | Discharge status: Home / Self Care | Payer: Self-pay | Source: Home / Self Care | Attending: Urology

## 2023-09-08 ENCOUNTER — Ambulatory Visit (HOSPITAL_COMMUNITY)
Admission: RE | Admit: 2023-09-08 | Discharge: 2023-09-08 | Disposition: A | Discharge status: Home / Self Care | Attending: Urology | Admitting: Urology

## 2023-09-08 ENCOUNTER — Other Ambulatory Visit: Payer: Self-pay

## 2023-09-08 DIAGNOSIS — Z6841 Body Mass Index (BMI) 40.0 and over, adult: Secondary | ICD-10-CM | POA: Diagnosis not present

## 2023-09-08 DIAGNOSIS — N200 Calculus of kidney: Secondary | ICD-10-CM | POA: Diagnosis present

## 2023-09-08 DIAGNOSIS — E1151 Type 2 diabetes mellitus with diabetic peripheral angiopathy without gangrene: Secondary | ICD-10-CM | POA: Insufficient documentation

## 2023-09-08 DIAGNOSIS — E66813 Obesity, class 3: Secondary | ICD-10-CM | POA: Diagnosis not present

## 2023-09-08 DIAGNOSIS — I10 Essential (primary) hypertension: Secondary | ICD-10-CM

## 2023-09-08 DIAGNOSIS — Z87442 Personal history of urinary calculi: Secondary | ICD-10-CM | POA: Insufficient documentation

## 2023-09-08 DIAGNOSIS — D759 Disease of blood and blood-forming organs, unspecified: Secondary | ICD-10-CM | POA: Diagnosis not present

## 2023-09-08 DIAGNOSIS — Z79899 Other long term (current) drug therapy: Secondary | ICD-10-CM | POA: Insufficient documentation

## 2023-09-08 DIAGNOSIS — Z7984 Long term (current) use of oral hypoglycemic drugs: Secondary | ICD-10-CM | POA: Insufficient documentation

## 2023-09-08 DIAGNOSIS — Z7902 Long term (current) use of antithrombotics/antiplatelets: Secondary | ICD-10-CM | POA: Diagnosis not present

## 2023-09-08 DIAGNOSIS — Z86718 Personal history of other venous thrombosis and embolism: Secondary | ICD-10-CM | POA: Diagnosis not present

## 2023-09-08 DIAGNOSIS — E119 Type 2 diabetes mellitus without complications: Secondary | ICD-10-CM

## 2023-09-08 DIAGNOSIS — Z01818 Encounter for other preprocedural examination: Secondary | ICD-10-CM

## 2023-09-08 HISTORY — PX: CYSTOSCOPY/URETEROSCOPY/HOLMIUM LASER/STENT PLACEMENT: SHX6546

## 2023-09-08 HISTORY — PX: CYSTOSCOPY/RETROGRADE/URETEROSCOPY/STONE EXTRACTION WITH BASKET: SHX5317

## 2023-09-08 LAB — GLUCOSE, CAPILLARY: Glucose-Capillary: 123 mg/dL — ABNORMAL HIGH (ref 70–99)

## 2023-09-08 SURGERY — CYSTOSCOPY/URETEROSCOPY/HOLMIUM LASER/STENT PLACEMENT
Anesthesia: General | Site: Ureter | Laterality: Right

## 2023-09-08 MED ORDER — FENTANYL CITRATE PF 50 MCG/ML IJ SOSY: 25.0000 ug | PREFILLED_SYRINGE | INTRAMUSCULAR | Status: DC | PRN

## 2023-09-08 MED ORDER — SODIUM CHLORIDE 0.9 % IR SOLN
Status: DC | PRN
  Administered 2023-09-08: 6000 mL via INTRAVESICAL
  Administered 2023-09-08: 3000 mL via INTRAVESICAL

## 2023-09-08 MED ORDER — KETOROLAC TROMETHAMINE 30 MG/ML IJ SOLN
INTRAMUSCULAR | Status: DC | PRN
  Administered 2023-09-08: 30 mg via INTRAVENOUS

## 2023-09-08 MED ORDER — MIDAZOLAM HCL 2 MG/2ML IJ SOLN
INTRAMUSCULAR | Status: DC | PRN
  Administered 2023-09-08: 2 mg via INTRAVENOUS

## 2023-09-08 MED ORDER — ORAL CARE MOUTH RINSE: 15.0000 mL | Freq: Once | OROMUCOSAL | Status: AC

## 2023-09-08 MED ORDER — FENTANYL CITRATE (PF) 100 MCG/2ML IJ SOLN
INTRAMUSCULAR | Status: AC
  Filled 2023-09-08: qty 2

## 2023-09-08 MED ORDER — CIPROFLOXACIN HCL 500 MG PO TABS: 500.0000 mg | ORAL_TABLET | Freq: Once | ORAL | 0 refills | Status: AC

## 2023-09-08 MED ORDER — DEXAMETHASONE SODIUM PHOSPHATE 10 MG/ML IJ SOLN
INTRAMUSCULAR | Status: AC
  Filled 2023-09-08: qty 1

## 2023-09-08 MED ORDER — TRAMADOL HCL 50 MG PO TABS: 50.0000 mg | ORAL_TABLET | Freq: Four times a day (QID) | ORAL | 0 refills | Status: AC | PRN

## 2023-09-08 MED ORDER — MIDAZOLAM HCL 2 MG/2ML IJ SOLN
INTRAMUSCULAR | Status: AC
Start: 2023-09-08 — End: 2023-09-08
  Filled 2023-09-08: qty 2

## 2023-09-08 MED ORDER — ONDANSETRON HCL 4 MG/2ML IJ SOLN
INTRAMUSCULAR | Status: AC
  Filled 2023-09-08: qty 2

## 2023-09-08 MED ORDER — AMISULPRIDE (ANTIEMETIC) 5 MG/2ML IV SOLN
INTRAVENOUS | Status: AC
  Filled 2023-09-08: qty 4

## 2023-09-08 MED ORDER — CHLORHEXIDINE GLUCONATE 0.12 % MT SOLN
15.0000 mL | Freq: Once | OROMUCOSAL | Status: AC
  Administered 2023-09-08: 15 mL via OROMUCOSAL

## 2023-09-08 MED ORDER — CIPROFLOXACIN IN D5W 400 MG/200ML IV SOLN
400.0000 mg | Freq: Two times a day (BID) | INTRAVENOUS | Status: DC
  Administered 2023-09-08: 400 mg via INTRAVENOUS
  Filled 2023-09-08: qty 200

## 2023-09-08 MED ORDER — TAMSULOSIN HCL 0.4 MG PO CAPS: 0.4000 mg | ORAL_CAPSULE | Freq: Every day | ORAL | 0 refills | Status: AC

## 2023-09-08 MED ORDER — PROPOFOL 10 MG/ML IV BOLUS
INTRAVENOUS | Status: DC | PRN
  Administered 2023-09-08: 200 mg via INTRAVENOUS

## 2023-09-08 MED ORDER — DEXAMETHASONE SODIUM PHOSPHATE 10 MG/ML IJ SOLN
INTRAMUSCULAR | Status: DC | PRN
  Administered 2023-09-08: 10 mg via INTRAVENOUS

## 2023-09-08 MED ORDER — ONDANSETRON HCL 4 MG/2ML IJ SOLN
4.0000 mg | Freq: Once | INTRAMUSCULAR | Status: AC | PRN
  Administered 2023-09-08: 4 mg via INTRAVENOUS

## 2023-09-08 MED ORDER — ONDANSETRON HCL 4 MG/2ML IJ SOLN
INTRAMUSCULAR | Status: DC | PRN
  Administered 2023-09-08: 4 mg via INTRAVENOUS

## 2023-09-08 MED ORDER — PROPOFOL 500 MG/50ML IV EMUL
INTRAVENOUS | Status: AC
  Filled 2023-09-08: qty 50

## 2023-09-08 MED ORDER — LIDOCAINE 2% (20 MG/ML) 5 ML SYRINGE
INTRAMUSCULAR | Status: DC | PRN
  Administered 2023-09-08: 100 mg via INTRAVENOUS

## 2023-09-08 MED ORDER — KETOROLAC TROMETHAMINE 30 MG/ML IJ SOLN
INTRAMUSCULAR | Status: AC
  Filled 2023-09-08: qty 1

## 2023-09-08 MED ORDER — ACETAMINOPHEN 500 MG PO TABS
1000.0000 mg | ORAL_TABLET | Freq: Once | ORAL | Status: AC
  Administered 2023-09-08: 1000 mg via ORAL
  Filled 2023-09-08: qty 2

## 2023-09-08 MED ORDER — IOHEXOL 300 MG/ML  SOLN
INTRAMUSCULAR | Status: DC | PRN
  Administered 2023-09-08: 8 mL

## 2023-09-08 MED ORDER — LACTATED RINGERS IV SOLN: INTRAVENOUS | Status: DC

## 2023-09-08 MED ORDER — FENTANYL CITRATE (PF) 100 MCG/2ML IJ SOLN
INTRAMUSCULAR | Status: DC | PRN
  Administered 2023-09-08 (×4): 50 ug via INTRAVENOUS

## 2023-09-08 MED ORDER — AMISULPRIDE (ANTIEMETIC) 5 MG/2ML IV SOLN
10.0000 mg | Freq: Once | INTRAVENOUS | Status: AC | PRN
  Administered 2023-09-08: 10 mg via INTRAVENOUS

## 2023-09-08 MED ORDER — PHENAZOPYRIDINE HCL 200 MG PO TABS: 200.0000 mg | ORAL_TABLET | Freq: Three times a day (TID) | ORAL | 0 refills | Status: AC | PRN

## 2023-09-08 SURGICAL SUPPLY — 21 items
BAG URO CATCHER STRL LF (MISCELLANEOUS) ×2 IMPLANT
BASKET ZERO TIP NITINOL 2.4FR (BASKET) IMPLANT
CATH URETL OPEN 5X70 (CATHETERS) ×2 IMPLANT
CLOTH BEACON ORANGE TIMEOUT ST (SAFETY) ×2 IMPLANT
EXTRACTOR STONE 1.7FRX115CM (UROLOGICAL SUPPLIES) IMPLANT
GLOVE SURG LX STRL 7.5 STRW (GLOVE) ×2 IMPLANT
GOWN STRL REUS W/ TWL XL LVL3 (GOWN DISPOSABLE) ×2 IMPLANT
GUIDEWIRE ANG ZIPWIRE 038X150 (WIRE) IMPLANT
GUIDEWIRE STR DUAL SENSOR (WIRE) ×2 IMPLANT
KIT TURNOVER KIT A (KITS) ×2 IMPLANT
MANIFOLD NEPTUNE II (INSTRUMENTS) ×2 IMPLANT
MAT HALF PREVALON HALF STRYKER (MISCELLANEOUS) IMPLANT
PACK CYSTO (CUSTOM PROCEDURE TRAY) ×2 IMPLANT
SHEATH NAV HD 11/13X46 (SHEATH) IMPLANT
SHEATH NAVIGATOR HD 11/13X28 (SHEATH) IMPLANT
SHEATH NAVIGATOR HD 11/13X36 (SHEATH) IMPLANT
STENT URET 6FRX26 CONTOUR (STENTS) IMPLANT
TRACTIP FLEXIVA PULS ID 200XHI (Laser) IMPLANT
TRACTIP FLEXIVA PULSE ID 200 (Laser) IMPLANT
TUBING CONNECTING 10 (TUBING) ×2 IMPLANT
TUBING UROLOGY SET (TUBING) ×2 IMPLANT

## 2023-09-08 NOTE — Transfer of Care (Signed)
 Immediate Anesthesia Transfer of Care Note  Patient: Nicholas [name]  Procedure(s) Performed: CYSTOSCOPY/URETEROSCOPY/HOLMIUM LASER/STENT PLACEMENT (Right: Ureter) CYSTOSCOPY, WITH CALCULUS REMOVAL USING BASKET (Right)  Patient Location: PACU  Anesthesia Type:General  Level of Consciousness: drowsy  Airway & Oxygen Therapy: Patient Spontanous Breathing  Post-op Assessment: Report given to RN  Post vital signs: Reviewed and stable  Last Vitals:  Vitals Value Taken Time  BP 152/96 09/08/23 11:07  Temp    Pulse 77 09/08/23 11:12  Resp 14 09/08/23 11:12  SpO2 96 % 09/08/23 11:12  Vitals shown include unfiled device data.  Last Pain:  Vitals:   09/08/23 0536  TempSrc:   PainSc: 0-No pain         Complications: No notable events documented.

## 2023-09-08 NOTE — Op Note (Signed)
 Preoperative diagnosis:  Large burden right nonobstructing stones  Postoperative diagnosis:  Same  Procedure: Cystoscopy, right retrograde pyelogram with interpretation Right ureteroscopy, laser lithotripsy and stone extraction Right ureteral stent placement  Surgeon: Morene MICAEL Salines, MD  Anesthesia: General  Complications: None  Intraoperative findings:  #1: [name] retrograde pyelogram demonstrated normal caliber right ureter with no filling defect or abnormality.  There was several large filling defects in the lower pole and a smaller 1 in the midpole consistent with the patient's known stones. 2.:  A 26 cm time 6 French double-J ureteral stent was placed at the end of the case in the right ureter, with a stent tether secured to the patient's penis with Tegaderm. 3.:  I was unable to access the stone in the lower pole because of the sharp angle.  I attempted many times to grab it with a basket move it unsuccessfully.  I also tried to laser it unsuccessfully.  As such, he is not stone free but the overwhelming majority of the stones have been removed.  EBL: Minimal  Specimens: Stones from the right kidney were sent home with the patient  Indication: Nicholas [name] is a 53 y.o. patient with history of bilateral ureteral stones.  We treated his left stone burden 6 months ago.  He is here today for right sided ureteroscopy and stone extraction..  After reviewing the management options for treatment, he elected to proceed with the above surgical procedure(s). We have discussed the potential benefits and risks of the procedure, side effects of the proposed treatment, the likelihood of the patient achieving the goals of the procedure, and any potential problems that might occur during the procedure or recuperation. Informed consent has been obtained.  Description of procedure:  Consent was obtained in the preoperative holding area.  He was brought back to the operating room  placed on the table in supine position.  General esthesia was induced and endotracheal tube was inserted.  Was placed in dorsolithotomy position and prepped and draped in the routine sterile fashion.  Timeout subsequently performed.  A 21 French 30 degree cystoscope was then gently passed to the patient's urethra into the bladder under vision guidance.  Cystoscopy was performed demonstrating normal bladder mucosa with no abnormalities and orthotopic ureteral orifice ease.  I used a 5 [place] open-ended ureteral catheter and performed a right retrograde pyelogram with the above findings.  I subsequently advanced a wire up through the open-ended catheter removing the catheter over the wire.  I then remove the scope leaving the wire behind and repassed the scope and passed a second wire into the patient's right renal pelvis.  I then emptied the patient's bladder and removed the scope over the wire.  I then advanced a 12/14 [place] large ureteral access sheath under fluoroscopic guidance up into the patient's proximal ureter.  I then advanced a flexible ureteroscope through the patient's ureteral access sheath and into the right collecting system.  There were numerous stones throughout the kidney.  There was a large stone in the midpole that I pushed up into the upper pole and then broke into small pieces removing all the fragments.  There were several stones in the other calyces within the upper and mid pole that I was able to remove.  There was a very large stone burden in the lower pole the most inferior lower pole calyx was inaccessible with a basket and I was unable to laser it and target the stone.  However, the adjacent  calyx was cleaned out and all the stones broken up and removed small pieces.  After numerous attempts at the lower pole stone by grabbing with the basket and trying to laser it unsuccessfully after this point to ensure that all the additional stone fragments were removed and leave the stone in  the lower pole.  I then slowly backed out the ureteroscope and cleaned out any stone fragments remaining.  I then repassed the cystoscope over the patient's safety wire through the urethra and into the bladder under visual guidance.  I then advanced a 26 cm time 6 French double-J stent over the wire into the right renal pelvis using fluoroscopic guidance.  Once the stent was noted to be well within the renal pelvis I advanced the stent to the bladder neck before removing the wire entirely.  A nice curl was noted within the renal pelvis as well as the bladder.  Pulled the stent tether through the patient's urethra and secured to the dorsum of his penis.  Disposition: The patient is being discharged home, and instructed to remove his stent on Wednesday, September 3.  He has follow-up with us  in 6 weeks with an ultrasound prior.

## 2023-09-08 NOTE — Interval H&P Note (Signed)
 History and Physical Interval Note:  09/08/2023 7:34 AM  Nicholas [name]  has presented today for surgery, with the diagnosis of LEFT RENAL STONES.  The various methods of treatment have been discussed with the patient and family. After consideration of risks, benefits and other options for treatment, the patient has consented to  Procedure(s) with comments: CYSTOSCOPY/URETEROSCOPY/HOLMIUM LASER/STENT PLACEMENT (Right) - CYSTOSCOPY/LEFT URETEROSCOPY/HOLMIUM LASER/STONE EXTRACTION/STENT PLACEMENT CYSTOSCOPY, WITH CALCULUS REMOVAL USING BASKET (Right) as a surgical intervention.  [name] history has been reviewed, patient examined, no change in status, stable for surgery.  I have reviewed the patient's chart and labs.  Questions were answered to the patient's satisfaction.     Morene LELON Salines

## 2023-09-08 NOTE — Anesthesia Postprocedure Evaluation (Signed)
 Anesthesia Post Note  Patient: Nicholas [name]  Procedure(s) Performed: CYSTOSCOPY/URETEROSCOPY/HOLMIUM LASER/STENT PLACEMENT (Right: Ureter) CYSTOSCOPY, WITH CALCULUS REMOVAL USING BASKET (Right)     Patient location during evaluation: PACU Anesthesia Type: General Level of consciousness: awake and alert Pain management: pain level controlled Vital Signs Assessment: post-procedure vital signs reviewed and stable Respiratory status: spontaneous breathing, nonlabored ventilation and respiratory function stable Cardiovascular status: blood pressure returned to baseline and stable Postop Assessment: no apparent nausea or vomiting Anesthetic complications: no   No notable events documented.  Last Vitals:  Vitals:   09/08/23 1130 09/08/23 1200  BP: 114/66 (!) 129/91  Pulse: 73 83  Resp: 14 15  Temp:    SpO2: 95% 95%    Last Pain:  Vitals:   09/08/23 1200  TempSrc:   PainSc: 0-No pain                 Garnette FORBES Skillern

## 2023-09-08 NOTE — Discharge Instructions (Signed)
 DISCHARGE INSTRUCTIONS FOR KIDNEY STONE/URETERAL STENT   MEDICATIONS:  1. Resume all your other meds from home - except do not take any extra narcotic pain meds that you may have at home.  2. Pyridium  is to help with the burning/stinging when you urinate. 3. Tramadol  is for moderate/severe pain, otherwise taking upto 1000 mg every 6 hours of plainTylenol will help treat your pain.   4. Take Cipro  one hour prior to removal of your stent.   ACTIVITY:  1. No strenuous activity x 1week  2. No driving while on narcotic pain medications  3. Drink plenty of water  4. Continue to walk at home - you can still get blood clots when you are at home, so keep active, but don't over do it.  5. May return to work/school tomorrow or when you feel ready   BATHING:  1. You can shower and we recommend daily showers  2. You have a string coming from your urethra: The stent string is attached to your ureteral stent. Do not pull on this.   SIGNS/SYMPTOMS TO CALL:  Please call us  if you have a fever greater than 101.5, uncontrolled nausea/vomiting, uncontrolled pain, dizziness, unable to urinate, bloody urine, chest pain, shortness of breath, leg swelling, leg pain, redness around wound, drainage from wound, or any other concerns or questions.   You can reach us  at [phone].   FOLLOW-UP:  1. You have an appointment in 6 weeks with a ultrasound of your kidneys prior.  2. You have a string attached to your stent, you may remove it on Sept 3rd. To do this, pull the strings until the stents are completely removed. You may feel an odd sensation in your back.

## 2023-09-08 NOTE — H&P (Signed)
 Left obstructing kidney stone.   The patient presents today for follow up. I saw him in hospital where he was admitted for a massive pulmonary embolism. He had incidental finding of a left-sided obstructing stone. At the time of the our consultation the patient was not having any pain. His stone was at the UPJ. He was placed on anticoagulation. He was advised to stay on continuous anticoagulation for at least 4 weeks. As such, we opted to proceed with conservative management seeing as he was not have any pain. He is here today for further evaluation.   In addition to the stone at the patient's left UPJ he has 2 or 3 additional stones in the left kidney. He also has a large stone burden in the right kidney that is not obstructing.   The patient has had several lithotripsy procedures by Dr. Gala in the past. He has never had a metabolic stone evaluation.   Patient's status post left ureteroscopy and stone extraction in February 2025   Interval: Today the patient is here for follow-up. A KUB prior to his appointment today. Since he was last seen he has done well without any additional issues or problems. Denies any flank pain.     ALLERGIES: Amoxicillin    MEDICATIONS: Losartan Potassium 25 MG Tablet 1 tablet PO Daily  metFORMIN  HCl 500 MG Tablet 1 tablet PO Daily  Rosuvastatin Calcium 5 MG Tablet 1 tablet PO Daily  Rybelsus  14 MG Tablet 1 tablet PO Daily  Xarelto      GU PSH: None   NON-GU PSH: Shoulder Arthroscopy/surgery - 07/13/2022 Visit Complexity (formerly [facility]) - 04/18/2023, 02/13/2023     GU PMH: Renal and ureteral calculus - 04/18/2023, - 03/10/2023, - 02/13/2023 Flank Pain - 03/10/2023      PMH Notes: blood clot in lung/ pe    NON-GU PMH: Diabetes Type 2 DVT, History    FAMILY HISTORY: 1 - Daughter Cancer - Mother Deceased - Father, Mother Heart Attack - Runs in Family kidney - Father Kidney Failure - Father Kidney Stones - Mother   SOCIAL HISTORY: Marital Status:  Unknown Preferred Language: English; Ethnicity: Not Hispanic Or Latino Current Smoking Status: Patient has never smoked.   Tobacco Use Assessment Completed: Used Tobacco in last 30 days? Does not use smokeless tobacco. Has never drank.  Drinks 1 caffeinated drink per day. Patient's occupation Programmer, applications.    REVIEW OF SYSTEMS:    GU Review Male:   Patient denies frequent urination, hard to postpone urination, burning/ pain with urination, get up at night to urinate, leakage of urine, stream starts and stops, trouble starting your stream, have to strain to urinate , erection problems, and penile pain.  Gastrointestinal (Upper):   Patient denies nausea, vomiting, and indigestion/ heartburn.  Gastrointestinal (Lower):   Patient denies diarrhea and constipation.  Constitutional:   Patient denies fever, night sweats, weight loss, and fatigue.  Skin:   Patient denies skin rash/ lesion and itching.  Eyes:   Patient denies blurred vision and double vision.  Ears/ Nose/ Throat:   Patient denies sore throat and sinus problems.  Hematologic/Lymphatic:   Patient denies swollen glands and easy bruising.  Cardiovascular:   Patient denies leg swelling and chest pains.  Respiratory:   Patient denies cough and shortness of breath.  Endocrine:   Patient denies excessive thirst.  Musculoskeletal:   Patient denies back pain and joint pain.  Neurological:   Patient denies headaches and dizziness.  Psychologic:   Patient denies depression  and anxiety.   Notes: Mild right flank pain off and on for 2 weeks  Off and on gross hematuria, no clots    VITAL SIGNS:      08/25/2023 08:57 AM  Weight 330 lb / 149.69 kg  BP 119/83 mmHg  Pulse 71 /min   MULTI-SYSTEM PHYSICAL EXAMINATION:    Constitutional: Well-nourished. No physical deformities. Normally developed. Good grooming.  Respiratory: Normal breath sounds. No labored breathing, no use of accessory muscles.   Cardiovascular: Regular rate and  rhythm. No murmur, no gallop. Normal temperature, normal extremity pulses, no swelling, no varicosities.      Complexity of Data:  Source Of History:  Patient  Records Review:   Previous Doctor Records, Previous Patient Records, POC Tool  Urine Test Review:   Urinalysis  X-Ray Review: KUB: Reviewed Films. Discussed With Patient.     PROCEDURES:         KUB - [id number]  A single view of the abdomen is obtained. Renal shadows are easily visualized bilaterally. The right renal shadow demonstrates a large cluster of stones in the midpole region There are no additional calcifications along the expected location of either ureter bilaterally.  Gas pattern is grossly normal. No significant bony abnormalities.      Impression: [name] left side is stone free, the right side has a large cluster of stones          Visit Complexity - G2211          Urinalysis w/Scope Dipstick Dipstick Cont'd Micro  Color: Yellow Bilirubin: Neg mg/dL WBC/hpf: 0 - 5/hpf  Appearance: Clear Ketones: Neg mg/dL RBC/hpf: 10 - 79/yeq  Specific Gravity: 1.025 Blood: 3+ ery/uL Bacteria: Rare (0-9/hpf)  pH: 5.5 Protein: Neg mg/dL Cystals: NS (Not Seen)  Glucose: Neg mg/dL Urobilinogen: 0.2 mg/dL Casts: NS (Not Seen)    Nitrites: Neg Trichomonas: Not Present    Leukocyte Esterase: Trace leu/uL Mucous: Present      Epithelial Cells: NS (Not Seen)      Yeast: NS (Not Seen)      Sperm: Not Present    ASSESSMENT:      ICD-10 Details  1 GU:   Renal and ureteral calculus - N20.2    PLAN:           Document Letter(s):  Created for Patient: Clinical Summary         Notes:   The patient has no residual stones in the left kidney, but has a large cluster on the right. He is interested in removing the stones as well. Would like to do it before the end of the year. I recommended right ureteroscopy, laser lithotripsy and stone extraction. Went through the surgery with him in detail including the fact that he did have  another stent. He understands all of this. He will need to stop his Xarelto . Will reach out to his hematologist to get that approval. Will schedule him at his convenience.

## 2023-09-08 NOTE — Anesthesia Procedure Notes (Signed)
 Procedure Name: LMA Insertion Date/Time: 09/08/2023 7:49 AM  Performed by: [place], Corean BROCKS, CRNAPre-anesthesia Checklist: Patient identified, Emergency Drugs available, Suction available and Patient being monitored Patient Re-evaluated:Patient Re-evaluated prior to induction Oxygen Delivery Method: Circle system utilized Preoxygenation: Pre-oxygenation with 100% oxygen Induction Type: IV induction Ventilation: Mask ventilation without difficulty LMA: LMA inserted LMA Size: 4.0 Number of attempts: 1 Airway Equipment and Method: Bite block Placement Confirmation: positive ETCO2 Tube secured with: Tape Dental Injury: Teeth and Oropharynx as per pre-operative assessment

## 2023-09-09 ENCOUNTER — Encounter (HOSPITAL_COMMUNITY): Payer: Self-pay | Admitting: Urology

## 2023-11-07 ENCOUNTER — Other Ambulatory Visit: Payer: Self-pay | Admitting: Oncology
